# Patient Record
Sex: Male | Born: 1945 | Race: Black or African American | Hispanic: No | Smoking: Never smoker
Health system: Southern US, Community
[De-identification: ages and names within clinical notes are randomized; demographics above are authoritative.]

## PROBLEM LIST (undated history)

## (undated) DIAGNOSIS — E119 Type 2 diabetes mellitus without complications: Secondary | ICD-10-CM

## (undated) DIAGNOSIS — E785 Hyperlipidemia, unspecified: Secondary | ICD-10-CM

## (undated) DIAGNOSIS — I10 Essential (primary) hypertension: Secondary | ICD-10-CM

## (undated) HISTORY — DX: Hyperlipidemia, unspecified: E78.5

## (undated) HISTORY — PX: CATARACT EXTRACTION: SUR2

## (undated) HISTORY — DX: Essential (primary) hypertension: I10

## (undated) HISTORY — PX: REPLACEMENT TOTAL KNEE: SUR1224

---

## 2006-12-24 ENCOUNTER — Ambulatory Visit: Payer: Self-pay | Admitting: Specialist

## 2008-05-19 ENCOUNTER — Other Ambulatory Visit: Payer: Self-pay

## 2008-05-19 ENCOUNTER — Inpatient Hospital Stay: Payer: Self-pay | Admitting: *Deleted

## 2010-06-21 ENCOUNTER — Emergency Department: Payer: Self-pay | Admitting: Emergency Medicine

## 2010-12-12 ENCOUNTER — Emergency Department: Payer: Self-pay | Admitting: Unknown Physician Specialty

## 2012-01-30 ENCOUNTER — Emergency Department: Payer: Self-pay | Admitting: Emergency Medicine

## 2012-01-30 LAB — CBC
HCT: 42.7 % (ref 40.0–52.0)
HGB: 14.1 g/dL (ref 13.0–18.0)
MCHC: 33 g/dL (ref 32.0–36.0)
Platelet: 225 10*3/uL (ref 150–440)
RBC: 5.25 10*6/uL (ref 4.40–5.90)
RDW: 13 % (ref 11.5–14.5)
WBC: 7.9 10*3/uL (ref 3.8–10.6)

## 2012-01-30 LAB — BASIC METABOLIC PANEL
Calcium, Total: 8.8 mg/dL (ref 8.5–10.1)
Chloride: 99 mmol/L (ref 98–107)
Co2: 28 mmol/L (ref 21–32)
EGFR (African American): >60
EGFR (Non-African Amer.): >60
Glucose: 160 mg/dL — ABNORMAL HIGH (ref 65–99)
Osmolality: 286 (ref 275–301)
Sodium: 141 mmol/L (ref 136–145)

## 2012-01-30 LAB — TROPONIN I: Troponin-I: <0.02 ng/mL

## 2012-01-31 LAB — CK TOTAL AND CKMB (NOT AT ARMC)
CK, Total: 399 U/L — ABNORMAL HIGH (ref 35–232)
CK-MB: 3.4 ng/mL (ref 0.5–3.6)

## 2012-01-31 LAB — TROPONIN I: Troponin-I: <0.02 ng/mL

## 2015-03-11 DIAGNOSIS — M1711 Unilateral primary osteoarthritis, right knee: Secondary | ICD-10-CM | POA: Insufficient documentation

## 2016-01-11 DIAGNOSIS — M75121 Complete rotator cuff tear or rupture of right shoulder, not specified as traumatic: Secondary | ICD-10-CM | POA: Insufficient documentation

## 2017-04-04 DIAGNOSIS — M19012 Primary osteoarthritis, left shoulder: Secondary | ICD-10-CM | POA: Insufficient documentation

## 2017-08-22 DIAGNOSIS — M19021 Primary osteoarthritis, right elbow: Secondary | ICD-10-CM | POA: Insufficient documentation

## 2017-11-14 DIAGNOSIS — M25461 Effusion, right knee: Secondary | ICD-10-CM | POA: Diagnosis not present

## 2017-11-14 DIAGNOSIS — M1711 Unilateral primary osteoarthritis, right knee: Secondary | ICD-10-CM | POA: Diagnosis not present

## 2017-12-28 ENCOUNTER — Ambulatory Visit: Payer: Self-pay | Admitting: Family Medicine

## 2017-12-28 ENCOUNTER — Encounter: Payer: Self-pay | Admitting: Family Medicine

## 2017-12-28 VITALS — BP 154/80 | HR 63 | Temp 98.5°F | Resp 16 | Ht 69.0 in | Wt 234.0 lb

## 2017-12-28 DIAGNOSIS — Z7689 Persons encountering health services in other specified circumstances: Secondary | ICD-10-CM

## 2017-12-28 DIAGNOSIS — E785 Hyperlipidemia, unspecified: Secondary | ICD-10-CM | POA: Insufficient documentation

## 2017-12-28 DIAGNOSIS — M8949 Other hypertrophic osteoarthropathy, multiple sites: Secondary | ICD-10-CM

## 2017-12-28 DIAGNOSIS — M150 Primary generalized (osteo)arthritis: Secondary | ICD-10-CM

## 2017-12-28 DIAGNOSIS — R7309 Other abnormal glucose: Secondary | ICD-10-CM

## 2017-12-28 DIAGNOSIS — E1169 Type 2 diabetes mellitus with other specified complication: Secondary | ICD-10-CM | POA: Insufficient documentation

## 2017-12-28 DIAGNOSIS — I10 Essential (primary) hypertension: Secondary | ICD-10-CM

## 2017-12-28 DIAGNOSIS — M159 Polyosteoarthritis, unspecified: Secondary | ICD-10-CM | POA: Insufficient documentation

## 2017-12-28 NOTE — Assessment & Plan Note (Addendum)
Inadequately controlled HTN on current regimen. Repeat BP manual limited improvement Chronic history of elevated BP. He has not adhered to lifestyle/diet recommendations - Home BP readings none available  No known complications     Plan:  1. Continue current BP regimen - Metoprolol XL 50mg  daily, Lisinopril 20mg  daily, HCTZ 12.5mg  cap daily 2. Encourage improved lifestyle - emphasized importance of low sodium diet, regular exercise 3. Start monitor BP outside office, bring readings to next visit, if persistently >140/90 or new symptoms notify office sooner 4. Follow-up 3 months - may need to inc dose HCTZ in future to 25mg  or lisinopril adjust other med

## 2017-12-28 NOTE — Assessment & Plan Note (Signed)
Uncertain results last done 3 months ago from prior PCP Last lipid panel reported 3 months ago  Plan: 1. Continue current meds - Atorvastatin 20mg  daily 2. Future consider ASA 3. Encourage improved lifestyle - low carb/cholesterol, reduce portion size, start regular exercise 4. Follow-up 3 months - future will consider repeat fasting lipids after review last result, likely needs only yearly  Will refill med when ready

## 2017-12-28 NOTE — Assessment & Plan Note (Signed)
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 Currently on Metformin 1000mg  BID - advised to continue for now has refills 2. Encourage improved lifestyle - low carb, low sugar diet, reduce portion size, start regular exercise - Handout given on low glycemic index diet options 3. Follow-up 3 months PreDM A1c  Review prior records

## 2017-12-28 NOTE — Patient Instructions (Addendum)
Thank you for coming to the office today.   1. Continue on same medicines  When you get to last 1-2 weeks of pills - then call OUR OFFICE here and request refill of them, we can give you 90 day supply  Try to improve diet with lower carb and sugars, and low sodium, drink more water like 30 to 60 oz of water in a day  Try to start walking for exercise  Please schedule a Follow-up Appointment to: Return in about 3 months (around 03/27/2018) for DM A1c, HTN, HLD med adjust.    If you have any other questions or concerns, please feel free to call the office or send a message through [place]. You may also schedule an earlier appointment if necessary.  Additionally, you may be receiving a survey about your experience at our office within a few days to 1 week by e-mail or mail. We value your feedback.  Nobie Putnam, DO [place]

## 2017-12-28 NOTE — Progress Notes (Signed)
Subjective:    Patient ID: Brady Sanchez, male    DOB: 1946/02/11, 72 y.o.   MRN: [id number]  Brady Sanchez is a 72 y.o. male presenting on 12/28/2017 for Establish Care (HTN)  Previous patient of Dr Nicky Pugh, prior PCP, who has now retired. He is here to establish care with new PCP.  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 He does not check BP at home, states it has not been easy to control on meds but he is not worried about it. Current Meds - Metoprolol XL 50mg  daily, Lisinopril 20mg  daily, HCTZ 12.5mg  cap daily   Reports good compliance, took meds today. Tolerating well, w/o complaints. - he has over half of 90 day supply left then will be out of refills  HYPERLIPIDEMIA: - Reports no concerns. Last lipid panel 3 months ago, do not have result for review. - Currently taking Atorvastatin 20mg  daily, tolerating well without side effects or myalgias  Elevated A1c Reports this is a more recent concern, he has had higher A1c recently, does not recall reading.  Does not check CBG Meds: Metformin 1000mg  BID (occasinal missed doses in PM) Reports good compliance. Tolerating well w/o side-effects Currently on ACEi Lifestyle: - Diet (Not following particular diet, not doing low carb or low sugar, he is aware needs to eat low sodium but does not do this much. Drinks some soda and occasionally alcohol, not drinking much water)  - Exercise (No regular exercise, active with walking often at work) Denies hypoglycemia, polyuria, visual changes, numbness or tingling.  History of Adjustment  Disorder / Anxiety >5-7 years ago, med dissolve thinks this may have been an anxiolytic med, withdrawal symptoms then finished it and did not want to take again.  Osteoarthritis, multiple joints Followed by [facility] has had recent worsening R Knee pain and received steroid injection before. He is s/p L TKR now with dramatic improvement Still has intermittent episodes of R knee pain, with some episodic swelling. He received a series of 3 R knee joint injections synvisc type, improved after.  He is widowed since 1995.  Health Maintenance: Due for Flu Shot, declines today despite counseling on benefits  He does not recall if ever received Pneumonia vaccines in past, will review record  He does not recall if ever had Colonoscopy.  Depression screen [facility] 2/9 12/28/2017  Decreased Interest 0  Down, Depressed, Hopeless 0  PHQ - 2 Score 0     GAD 7 : Generalized Anxiety Score 12/28/2017  Nervous, Anxious, on Edge 0  Control/stop worrying 0  Worry too much - different things 0  Trouble relaxing 0  Restless 0  Easily annoyed or irritable 0  Afraid - awful might happen 0  Total GAD 7 Score 0  Anxiety Difficulty Not difficult at all    Past Medical History:  Diagnosis Date  . Hyperlipidemia   . Hypertension    Past Surgical History:  Procedure Laterality Date  . REPLACEMENT TOTAL KNEE     Social History   Socioeconomic History  . Marital status: Widowed  Spouse name: Not on file  . Number of children: Not on file  . Years of education: Not on file  . Highest education level: Not on file  Social Needs  . Financial resource strain: Not on file  . Food insecurity - worry: Not on file  . Food insecurity - inability: Not on file  . Transportation needs - medical: Not on file  . Transportation needs - non-medical: Not on file  Occupational History  . Not on file  Tobacco Use  . Smoking status: Never Smoker  . Smokeless tobacco: Never Used  Substance  and Sexual Activity  . Alcohol use: Yes  . Drug use: No  . Sexual activity: Not on file  Other Topics Concern  . Not on file  Social History Narrative  . Not on file   Family History  Problem Relation Age of Onset  . Seizures Mother   . Cancer Father        lung   Current Outpatient Medications on File Prior to Visit  Medication Sig  . atorvastatin (LIPITOR) 20 MG tablet TAKE 1 TABLET BY MOUTH EVERY DAY FOR CHOLESTEROL  . hydrochlorothiazide (MICROZIDE) 12.5 MG capsule TAKE ONE CAPSULE BY MOUTH EVERY DAY FOR HIGH BLOOD PRESSURE  . lisinopril (PRINIVIL,ZESTRIL) 20 MG tablet Take 20 mg by mouth daily.  . metFORMIN (GLUCOPHAGE) 1000 MG tablet Take 1,000 mg by mouth 2 (two) times daily.  . metoprolol succinate (TOPROL-XL) 50 MG 24 hr tablet Take 50 mg by mouth daily. for high blood pressure   No current facility-administered medications on file prior to visit.     Review of Systems  Constitutional: Negative for activity change, appetite change, chills, diaphoresis, fatigue and fever.  HENT: Negative for congestion and hearing loss.   Eyes: Negative for visual disturbance.  Respiratory: Negative for cough, chest tightness, shortness of breath and wheezing.   Cardiovascular: Negative for chest pain, palpitations and leg swelling.  Gastrointestinal: Negative for abdominal pain, anal bleeding, blood in stool, constipation, diarrhea, nausea and vomiting.  Endocrine: Negative for cold intolerance and polyuria.  Genitourinary: Negative for dysuria, frequency and hematuria.  Musculoskeletal: Positive for arthralgias (r knee pain, arthritis). Negative for neck pain.  Skin: Negative for rash.  Allergic/Immunologic: Negative for environmental allergies.  Neurological: Negative for dizziness, weakness, light-headedness, numbness and headaches.  Hematological: Negative for adenopathy.  Psychiatric/Behavioral: Negative for behavioral problems, dysphoric mood and sleep disturbance. The patient  is not nervous/anxious.    Per HPI unless specifically indicated above     Objective:    BP (!) 154/80 (Cuff Size: Normal)   Pulse 63   Temp 98.5 F (36.9 C) (Oral)   Resp 16   Ht 5\' 9"  (1.753 m)   Wt 234 lb (106.1 kg)   BMI 34.56 kg/m   Wt Readings from Last 3 Encounters:  12/28/17 234 lb (106.1 kg)    Physical Exam  Constitutional: He is oriented to person, place, and time. He appears well-developed and well-nourished. No distress.  Well-appearing, comfortable, cooperative  HENT:  Head: Normocephalic and atraumatic.  Mouth/Throat: Oropharynx is clear and moist.  Eyes: Conjunctivae are normal. Right eye exhibits no discharge. Left eye exhibits no discharge.  Neck: Normal range of motion. Neck supple. No thyromegaly present.  Cardiovascular: Normal rate, regular rhythm, normal heart sounds and intact distal pulses.  No murmur heard. Pulmonary/Chest: Effort normal and breath sounds normal. No respiratory distress. He has no wheezes. He has no rales.  Musculoskeletal: Normal range of motion. He  exhibits no edema.  Right Knees Inspection: Some bulky deformity appearance and symmetrical. No ecchymosis. Mild localized soft tissue edema. Palpation: Non-tender. Mild crepitus ROM: Slightly limited full ROm flex/ext Strength: 5/5 intact knee flex/ext, ankle dorsi/plantarflex Neurovascular: distally intact sensation light touch and pulses  Lymphadenopathy:    He has no cervical adenopathy.  Neurological: He is alert and oriented to person, place, and time.  Skin: Skin is warm and dry. No rash noted. He is not diaphoretic. No erythema.  Psychiatric: He has a normal mood and affect. His behavior is normal.  Well groomed, good eye contact, normal speech and thoughts  Nursing note and vitals reviewed.  No results found for this or any previous visit.    Assessment & Plan:   Problem List Items Addressed This Visit    Elevated hemoglobin A1c    Unknown current status A1c, seems to  be Pre-DM based on history Concern with obesity, HTN, HLD  Plan:  1. Currently on Metformin 1000mg  BID - advised to continue for now has refills 2. Encourage improved lifestyle - low carb, low sugar diet, reduce portion size, start regular exercise - Handout given on low glycemic index diet options 3. Follow-up 3 months PreDM A1c  Review prior records      Hyperlipidemia    Uncertain results last done 3 months ago from prior PCP Last lipid panel reported 3 months ago  Plan: 1. Continue current meds - Atorvastatin 20mg  daily 2. Future consider ASA 3. Encourage improved lifestyle - low carb/cholesterol, reduce portion size, start regular exercise 4. Follow-up 3 months - future will consider repeat fasting lipids after review last result, likely needs only yearly  Will refill med when ready      Relevant Medications   hydrochlorothiazide (MICROZIDE) 12.5 MG capsule   lisinopril (PRINIVIL,ZESTRIL) 20 MG tablet   metoprolol succinate (TOPROL-XL) 50 MG 24 hr tablet   atorvastatin (LIPITOR) 20 MG tablet   Hypertension - Primary    Inadequately controlled HTN on current regimen. Repeat BP manual limited improvement Chronic history of elevated BP. He has not adhered to lifestyle/diet recommendations - Home BP readings none available  No known complications     Plan:  1. Continue current BP regimen - Metoprolol XL 50mg  daily, Lisinopril 20mg  daily, HCTZ 12.5mg  cap daily 2. Encourage improved lifestyle - emphasized importance of low sodium diet, regular exercise 3. Start monitor BP outside office, bring readings to next visit, if persistently >140/90 or new symptoms notify office sooner 4. Follow-up 3 months - may need to inc dose HCTZ in future to 25mg  or lisinopril adjust other med      Relevant Medications   hydrochlorothiazide (MICROZIDE) 12.5 MG capsule   lisinopril (PRINIVIL,ZESTRIL) 20 MG tablet   metoprolol succinate (TOPROL-XL) 50 MG 24 hr tablet   atorvastatin (LIPITOR)  20 MG tablet   Osteoarthritis of multiple joints    Review outside records/imaging Prior injections, advised to follow-up when needed Followed by [facility]       Other Visit Diagnoses    Encounter to establish care with new doctor     Requested prior PCP records from Dr Brynda Greathouse, who has retired Will review upon receipt Need to determine prior screening vaccines, colonoscopy, labs etc      No orders of the defined types were placed in this encounter.   Follow up plan: Return in about 3 months (around 03/27/2018) for DM A1c, HTN, HLD med adjust.  Notify office when nearly out of meds 1-2  week in advance will send 90 day refills  Nobie Putnam, [facility] 12/28/2017, 5:41 PM

## 2017-12-28 NOTE — Assessment & Plan Note (Signed)
Review outside records/imaging Prior injections, advised to follow-up when needed Followed by [place]

## 2018-02-08 ENCOUNTER — Encounter: Payer: Self-pay | Admitting: Family Medicine

## 2018-02-08 ENCOUNTER — Ambulatory Visit (INDEPENDENT_AMBULATORY_CARE_PROVIDER_SITE_OTHER): Payer: BLUE CROSS/BLUE SHIELD | Admitting: Family Medicine

## 2018-02-08 VITALS — BP 148/80 | HR 61 | Temp 97.9°F | Resp 16 | Ht 69.0 in | Wt 235.0 lb

## 2018-02-08 DIAGNOSIS — M159 Polyosteoarthritis, unspecified: Secondary | ICD-10-CM

## 2018-02-08 DIAGNOSIS — M150 Primary generalized (osteo)arthritis: Secondary | ICD-10-CM

## 2018-02-08 DIAGNOSIS — I10 Essential (primary) hypertension: Secondary | ICD-10-CM | POA: Diagnosis not present

## 2018-02-08 DIAGNOSIS — M8949 Other hypertrophic osteoarthropathy, multiple sites: Secondary | ICD-10-CM

## 2018-02-08 DIAGNOSIS — M1711 Unilateral primary osteoarthritis, right knee: Secondary | ICD-10-CM

## 2018-02-08 MED ORDER — HYDROCHLOROTHIAZIDE 12.5 MG PO CAPS: 12.5000 mg | ORAL_CAPSULE | Freq: Every day | ORAL | 3 refills | Status: DC

## 2018-02-08 MED ORDER — LISINOPRIL 20 MG PO TABS: 20.0000 mg | ORAL_TABLET | Freq: Every day | ORAL | 3 refills | Status: DC

## 2018-02-08 MED ORDER — METOPROLOL SUCCINATE ER 50 MG PO TB24: 50.0000 mg | ORAL_TABLET | Freq: Every day | ORAL | 3 refills | Status: DC

## 2018-02-08 NOTE — Progress Notes (Signed)
Subjective:    Patient ID: Brady Sanchez, male    DOB: 1946/09/16, 72 y.o.   MRN: [id number]  Brady Sanchez is a 72 y.o. male presenting on 02/08/2018 for Knee Pain (Right side )   HPI   CHRONIC HTN: Reports problem HTN >10 years now. He does not check BP at home, states it has not been easy to control on meds Current Meds - Metoprolol XL 50mg  daily, Lisinopril 20mg  daily, HCTZ 12.5mg  cap daily - request refills today Reports good compliance, took meds today. Tolerating well, w/o complaints Limited exercise due to knee pain. Denies CP, dyspnea, HA, edema, dizziness / lightheadedness  Chronic R Knee Pain / Osteoarthritis, multiple joints Followed by [facility]) last apt 11/2017. He is s/p L TKR from Dr Okey Dupre at same office. He has done well with this, and is now considering R knee replacement. - he has completed R knee injections series of 3 synvisc and also steroid injections, temporary relief only, limited results - Now he complaints of gradual worsening pain and stiffness in back muscles of knee - Worse with prolonged activity with work - Occasional weakness in knee feels may "give way" or unstable at times - Takes occasional Ibuprofen 200mg  x 3 at night with some relief, lay down some improved Denies any fall or injury, worsening redness swelling other joint pain   Health Maintenance: Due for several HM topics - reviewed record from prior PCP and these were not completed. We can review these at future physical  Depression screen [facility] 2/9 02/08/2018 12/28/2017  Decreased Interest 0 0  Down, Depressed, Hopeless 0 0  PHQ - 2 Score 0 0    Social History   Tobacco Use  . Smoking status: Never Smoker  . Smokeless tobacco: Never Used  Substance Use Topics  . Alcohol use: Yes  . Drug use: No    Review of Systems Per HPI unless specifically indicated above     Objective:    BP (!) 148/80 (BP Location: Left Arm, Cuff Size: Normal)   Pulse 61    Temp 97.9 F (36.6 C) (Oral)   Resp 16   Ht 5\' 9"  (1.753 m)   Wt 235 lb (106.6 kg)   BMI 34.70 kg/m   Wt Readings from Last 3 Encounters:  02/08/18 235 lb (106.6 kg)  12/28/17 234 lb (106.1 kg)    Physical Exam  Constitutional: He is oriented to person, place, and time. He appears well-developed and well-nourished. No distress.  Well-appearing, comfortable, cooperative  HENT:  Head: Normocephalic and atraumatic.  Mouth/Throat: Oropharynx is clear and moist.  Eyes: Conjunctivae are normal. Right eye exhibits no discharge. Left eye exhibits no discharge.  Neck: Normal range of motion. Neck supple.  Cardiovascular: Normal rate, regular rhythm, normal heart sounds and intact distal pulses.  No murmur heard. Pulmonary/Chest: Effort normal and breath sounds normal. No respiratory distress. He has no wheezes. He has no rales.  Musculoskeletal: Normal range of motion. He exhibits no edema.  Right Knee - similar to last exam Inspection: Some bulky deformity appearance and symmetrical. No ecchymosis. Mild localized soft tissue edema. Palpation: Mild sore but non tender posterior knee area and muscles. Mild crepitus ROM: Slightly limited full ROM flex/ext - compared to last visit Strength: 5/5 intact knee flex/ext, ankle dorsi/plantarflex Neurovascular: distally intact sensation light touch and pulses  Antalgic gait due to R knee pain  Lymphadenopathy:    He has no cervical adenopathy.  Neurological: He is  alert and oriented to person, place, and time.  Skin: Skin is warm and dry. No rash noted. He is not diaphoretic. No erythema.  Psychiatric: He has a normal mood and affect. His behavior is normal.  Well groomed, good eye contact, normal speech and thoughts  Nursing note and vitals reviewed.  No results found for this or any previous visit.    Assessment & Plan:   Problem List Items Addressed This Visit    Hypertension - Primary    Still seems inadequately controlled HTN on  current regimen. Repeat BP manual somewhat improved. Suspect chronic issue on BP control, also limited lifestyle and chronic knee pain contributing - Home BP readings none available  No known complications - due for labs Cr check    Plan:  1. Continue current BP regimen - refilled Metoprolol XL 50mg  daily, Lisinopril 20mg  daily, HCTZ 12.5mg  cap daily 2. Encourage improved lifestyle - emphasized importance of low sodium diet, regular exercise 3. Start monitor BP outside office, bring readings to next visit, if persistently >140/90 or new symptoms notify office sooner 4. Follow-up 3 months will be due for annual- requested blood work to be scheduled, may need to inc dose HCTZ in future to 25mg  or lisinopril adjust other med      Relevant Medications   hydrochlorothiazide (MICROZIDE) 12.5 MG capsule   lisinopril (PRINIVIL,ZESTRIL) 20 MG tablet   metoprolol succinate (TOPROL-XL) 50 MG 24 hr tablet   Osteoarthritis of multiple joints    Chronic worsening R generalized vs posterior Knee pain without known injury or trauma  Known knee OA/DJD - Able to bear weight some gait difficulty, some instability,concern possible meniscus or degenerative problem - Prior L TKR, no R knee surgery - Inadequate conservative therapy / not responding to injection therapy per Sports Medicine  Plan: 1. Reviewed prognosis and treatment course for knee OA/DJD and knee pain - Ultimately recommend that he call and schedule f/u back with [facility], have consult with ortho for R knee, may consider further imaging or adv imaging and possible arthroscope vs TKR if his quality of life is at the point where he may benefit from surgery now with limitations from knee pain - Recommend Tylenol, topical therapy, RICE therapy - Offered steroid injection for temporary relief he declined today - He may consider topical voltaren gel NSAID PRN - Follow-up as needed - advised I have little more to offer for his knee         Other Visit Diagnoses    Primary osteoarthritis of right knee          Meds ordered this encounter  Medications  . hydrochlorothiazide (MICROZIDE) 12.5 MG capsule    Sig: Take 1 capsule (12.5 mg total) by mouth daily.    Dispense:  90 capsule    Refill:  3  . lisinopril (PRINIVIL,ZESTRIL) 20 MG tablet    Sig: Take 1 tablet (20 mg total) by mouth daily.    Dispense:  90 tablet    Refill:  3  . metoprolol succinate (TOPROL-XL) 50 MG 24 hr tablet    Sig: Take 1 tablet (50 mg total) by mouth daily. for high blood pressure    Dispense:  90 tablet    Refill:  3      Follow up plan: Return in about 3 months (around 05/11/2018).  He may schedule future fasting lab and Annual Physical in 3 months, or can return as planned in May or June for DM A1c follow-up and  HTN med adjust  Nobie Putnam, [facility] 02/08/2018, 12:07 PM

## 2018-02-08 NOTE — Assessment & Plan Note (Signed)
Chronic worsening R generalized vs posterior Knee pain without known injury or trauma  Known knee OA/DJD - Able to bear weight some gait difficulty, some instability,concern possible meniscus or degenerative problem - Prior L TKR, no R knee surgery - Inadequate conservative therapy / not responding to injection therapy per Sports Medicine  Plan: 1. Reviewed prognosis and treatment course for knee OA/DJD and knee pain - Ultimately recommend that he call and schedule f/u back with [facility], have consult with ortho for R knee, may consider further imaging or adv imaging and possible arthroscope vs TKR if his quality of life is at the point where he may benefit from surgery now with limitations from knee pain - Recommend Tylenol, topical therapy, RICE therapy - Offered steroid injection for temporary relief he declined today - He may consider topical voltaren gel NSAID PRN - Follow-up as needed - advised I have little more to offer for his knee

## 2018-02-08 NOTE — Patient Instructions (Addendum)
Thank you for coming to the office today.  For knee I think the arthritis may be severe enough that it may require surgery or at least consultation back with your [facility] doctor  Call them to schedule to see Dr Florian Buff or associate to determine what to do next  [facility]  [place], [place] [address]  [phone]  If you change your mind, we can do an injection here of steroid medicine again  Refilled BP meds  DUE for FASTING BLOOD WORK (no food or drink after midnight before the lab appointment, only water or coffee without cream/sugar on the morning of)  SCHEDULE "Lab Only" visit in the morning at the clinic for lab draw in 3 MONTHS   - Make sure Lab Only appointment is at about 1 week before your next appointment, so that results will be available  For Lab Results, once available within 2-3 days of blood draw, you can can log in to MyChart online to view your results and a brief explanation. Also, we can discuss results at next follow-up visit.   Please schedule a Follow-up Appointment to: Return in about 3 months (around 05/11/2018) for Annual Physical.  If you have any other questions or concerns, please feel free to call the office or send a message through [place]. You may also schedule an earlier appointment if necessary.  Additionally, you may be receiving a survey about your experience at our office within a few days to 1 week by e-mail or mail. We value your feedback.  Nobie Putnam, DO [place]

## 2018-02-08 NOTE — Assessment & Plan Note (Signed)
Still seems inadequately controlled HTN on current regimen. Repeat BP manual somewhat improved. Suspect chronic issue on BP control, also limited lifestyle and chronic knee pain contributing - Home BP readings none available  No known complications - due for labs Cr check    Plan:  1. Continue current BP regimen - refilled Metoprolol XL 50mg  daily, Lisinopril 20mg  daily, HCTZ 12.5mg  cap daily 2. Encourage improved lifestyle - emphasized importance of low sodium diet, regular exercise 3. Start monitor BP outside office, bring readings to next visit, if persistently >140/90 or new symptoms notify office sooner 4. Follow-up 3 months will be due for annual- requested blood work to be scheduled, may need to inc dose HCTZ in future to 25mg  or lisinopril adjust other med

## 2018-02-21 DIAGNOSIS — E1121 Type 2 diabetes mellitus with diabetic nephropathy: Secondary | ICD-10-CM | POA: Diagnosis not present

## 2018-02-21 DIAGNOSIS — M1711 Unilateral primary osteoarthritis, right knee: Secondary | ICD-10-CM | POA: Diagnosis not present

## 2018-03-28 ENCOUNTER — Ambulatory Visit: Payer: Self-pay | Admitting: Family Medicine

## 2018-05-01 DIAGNOSIS — E119 Type 2 diabetes mellitus without complications: Secondary | ICD-10-CM | POA: Diagnosis not present

## 2018-05-06 DIAGNOSIS — E119 Type 2 diabetes mellitus without complications: Secondary | ICD-10-CM | POA: Insufficient documentation

## 2018-05-08 DIAGNOSIS — E119 Type 2 diabetes mellitus without complications: Secondary | ICD-10-CM | POA: Diagnosis not present

## 2018-05-13 DIAGNOSIS — M1711 Unilateral primary osteoarthritis, right knee: Secondary | ICD-10-CM | POA: Diagnosis not present

## 2018-05-13 DIAGNOSIS — M25562 Pain in left knee: Secondary | ICD-10-CM | POA: Diagnosis not present

## 2018-05-13 DIAGNOSIS — Z96652 Presence of left artificial knee joint: Secondary | ICD-10-CM | POA: Diagnosis not present

## 2018-05-13 LAB — BASIC METABOLIC PANEL: Creatinine: 1.1 (ref <=1.3)

## 2018-05-13 LAB — HEMOGLOBIN A1C: HEMOGLOBIN A1C: 7.9

## 2018-05-17 ENCOUNTER — Encounter: Payer: Self-pay | Admitting: Family Medicine

## 2018-05-17 ENCOUNTER — Ambulatory Visit: Payer: BLUE CROSS/BLUE SHIELD | Admitting: Family Medicine

## 2018-05-17 VITALS — BP 138/64 | HR 59 | Temp 98.4°F | Resp 16 | Ht 69.0 in | Wt 223.0 lb

## 2018-05-17 DIAGNOSIS — I10 Essential (primary) hypertension: Secondary | ICD-10-CM

## 2018-05-17 DIAGNOSIS — E1169 Type 2 diabetes mellitus with other specified complication: Secondary | ICD-10-CM

## 2018-05-17 MED ORDER — METOPROLOL SUCCINATE ER 50 MG PO TB24: 50.0000 mg | ORAL_TABLET | Freq: Every day | ORAL | 3 refills | Status: DC

## 2018-05-17 NOTE — Progress Notes (Signed)
Subjective:    Patient ID: Brady Sanchez, male    DOB: [birth year], 72 y.o.   MRN: [id number]  Brady Sanchez is a 72 y.o. male presenting on 05/17/2018 for Hypertension (has question about metoprolol and lisinopril)   HPI   CHRONIC HTN: Recently ran out of Metoprolol XL 50mg  daily, this was prior BP med from prior PCP Dr Brynda Greathouse. He does not check BP at home, states it has not been easy to control on meds in past. Current Meds -Metoprolol XL 50mg  daily (OUT CURRENTLY), Lisinopril 20mg  daily, HCTZ 12.5mg  cap daily Reports good compliance, took meds today. Tolerating well, w/o complaints Limited exercise due to knee pain. Denies CP, dyspnea, HA, edema, dizziness / lightheadedness  Additional information - Scheduled for Right Knee Total Arthroplasty surgery by Dr Florian Buff [facility] on 05/31/18 - He was referred to [facility] for pre-op improving A1c, last A1c checked on 05/13/18 was 7.9, he ran out of Metformin and did not notify our office, he ran out of existing rx from prior PCP Dr Brynda Greathouse. Now his rx was filled by [facility].   Depression screen [facility] 2/9 05/17/2018 02/08/2018 12/28/2017  Decreased Interest 0 0 0  Down, Depressed, Hopeless 0 0 0  PHQ - 2 Score 0 0 0    Social History   Tobacco Use  . Smoking status: Never Smoker  . Smokeless tobacco: Never Used  Substance Use Topics  . Alcohol use: Yes  . Drug use: No    Review of Systems Per HPI unless specifically indicated above     Objective:    BP 138/64 (BP Location: Left Arm, Cuff Size: Normal)   Pulse (!) 59   Temp 98.4 F (36.9 C) (Oral)   Resp 16   Ht 5\' 9"  (1.753 m)   Wt 223 lb (101.2 kg)   BMI 32.93 kg/m   Wt Readings from Last 3 Encounters:  05/17/18 223 lb (101.2 kg)  02/08/18 235 lb (106.6 kg)  12/28/17 234 lb (106.1 kg)    Physical Exam  Constitutional: He is oriented to person, place, and time. He appears well-developed and well-nourished. No distress.    Well-appearing, comfortable, cooperative  HENT:  Head: Normocephalic and atraumatic.  Mouth/Throat: Oropharynx is clear and moist.  Eyes: Conjunctivae are normal. Right eye exhibits no discharge. Left eye exhibits no discharge.  Cardiovascular: Normal rate.  Pulmonary/Chest: Effort normal.  Musculoskeletal: He exhibits no edema.  Neurological: He is alert and oriented to person, place, and time.  Skin: Skin is warm and dry. No rash noted. He is not diaphoretic. No erythema.  Psychiatric: He has a normal mood and affect. His behavior is normal.  Well groomed, good eye contact, normal speech and thoughts  Nursing note and vitals reviewed.  Results for orders placed or performed in visit on 54/65/68  Basic metabolic panel  Result Value Ref Range   Creatinine 1.1 0.6 - 1.3  Hemoglobin A1c  Result Value Ref Range   Hemoglobin A1C 7.9       Assessment & Plan:   Problem List Items Addressed This Visit    Hypertension - Primary    Still seems inadequately controlled HTN on current regimen. Repeat BP manual somewhat improved. Suspect chronic issue on BP control, also limited lifestyle and chronic knee pain contributing - Home BP readings none available  No known complications   Plan:  - Off metoprolol - restart on refill, he ran out of rx from prior PCP - pharmacy  did not fill the last one that I ordered 01/2018 1. Continue current BP regimen - refilled Metoprolol XL 50mg  daily, Lisinopril 20mg  daily, HCTZ 12.5mg  cap daily 2. Encourage improved lifestyle - emphasized importance of low sodium diet, regular exercise 3. Start monitor BP outside office, bring readings to next visit, if persistently >140/90 or new symptoms notify office sooner 4. Follow-up 3 months - may need to inc dose HCTZ in future to 25mg  or lisinopril adjust other med      Relevant Medications   metoprolol succinate (TOPROL-XL) 50 MG 24 hr tablet   Type 2 diabetes mellitus with other specified complication ([place])     Recently confirmed A1c 7.9 on 05/13/18 from [facility] End[facility] - note he did not follow-up within 3 months back in 03/2018 as advised for [place] to check his A1c Referred to Endo by Ortho for optimizing A1c before surgery He was off Metformin for while due to no refills from prior PCP, did not notify our office He has been on Metformin now 1000mg  BID for few days Follow-up 3 months for DM A1c and routine screening visit         Meds ordered this encounter  Medications  . metoprolol succinate (TOPROL-XL) 50 MG 24 hr tablet    Sig: Take 1 tablet (50 mg total) by mouth daily. for high blood pressure    Dispense:  90 tablet    Refill:  3      Follow up plan: Return in about 3 months (around 08/17/2018) for HTN, DM A1c.  Nobie Putnam, [facility] 05/17/2018, 10:06 AM

## 2018-05-17 NOTE — Assessment & Plan Note (Signed)
Still seems inadequately controlled HTN on current regimen. Repeat BP manual somewhat improved. Suspect chronic issue on BP control, also limited lifestyle and chronic knee pain contributing - Home BP readings none available  No known complications   Plan:  - Off metoprolol - restart on refill, he ran out of rx from prior PCP - pharmacy did not fill the last one that I ordered 01/2018 1. Continue current BP regimen - refilled Metoprolol XL 50mg  daily, Lisinopril 20mg  daily, HCTZ 12.5mg  cap daily 2. Encourage improved lifestyle - emphasized importance of low sodium diet, regular exercise 3. Start monitor BP outside office, bring readings to next visit, if persistently >140/90 or new symptoms notify office sooner 4. Follow-up 3 months - may need to inc dose HCTZ in future to 25mg  or lisinopril adjust other med

## 2018-05-17 NOTE — Assessment & Plan Note (Signed)
Recently confirmed A1c 7.9 on 05/13/18 from [facility]ology - note he did not follow-up within 3 months back in 03/2018 as advised for [place] to check his A1c Referred to Endo by Ortho for optimizing A1c before surgery He was off Metformin for while due to no refills from prior PCP, did not notify our office He has been on Metformin now 1000mg  BID for few days Follow-up 3 months for DM A1c and routine screening visit

## 2018-05-17 NOTE — Patient Instructions (Addendum)
Thank you for coming to the office today.  Refilled Metoprolol Succinate XL 50mg  - take one daily  Continue to follow-up with [facility] and Ortho for Knee surgery  Please schedule a Follow-up Appointment to: Return in about 3 months (around 08/17/2018) for HTN, DM A1c.  If you have any other questions or concerns, please feel free to call the office or send a message through [place]. You may also schedule an earlier appointment if necessary.  Additionally, you may be receiving a survey about your experience at our office within a few days to 1 week by e-mail or mail. We value your feedback.  Nobie Putnam, DO [place]

## 2018-05-22 DIAGNOSIS — E119 Type 2 diabetes mellitus without complications: Secondary | ICD-10-CM | POA: Diagnosis not present

## 2018-05-24 ENCOUNTER — Other Ambulatory Visit: Payer: Self-pay | Admitting: Family Medicine

## 2018-05-31 DIAGNOSIS — G8918 Other acute postprocedural pain: Secondary | ICD-10-CM | POA: Diagnosis not present

## 2018-05-31 DIAGNOSIS — E669 Obesity, unspecified: Secondary | ICD-10-CM | POA: Diagnosis not present

## 2018-05-31 DIAGNOSIS — M21061 Valgus deformity, not elsewhere classified, right knee: Secondary | ICD-10-CM | POA: Diagnosis not present

## 2018-05-31 DIAGNOSIS — M1711 Unilateral primary osteoarthritis, right knee: Secondary | ICD-10-CM | POA: Diagnosis not present

## 2018-05-31 DIAGNOSIS — F40240 Claustrophobia: Secondary | ICD-10-CM | POA: Diagnosis not present

## 2018-05-31 DIAGNOSIS — Z7984 Long term (current) use of oral hypoglycemic drugs: Secondary | ICD-10-CM | POA: Diagnosis not present

## 2018-05-31 DIAGNOSIS — I10 Essential (primary) hypertension: Secondary | ICD-10-CM | POA: Diagnosis not present

## 2018-05-31 DIAGNOSIS — Z6833 Body mass index (BMI) 33.0-33.9, adult: Secondary | ICD-10-CM | POA: Diagnosis not present

## 2018-05-31 DIAGNOSIS — Z96652 Presence of left artificial knee joint: Secondary | ICD-10-CM | POA: Diagnosis not present

## 2018-05-31 DIAGNOSIS — E119 Type 2 diabetes mellitus without complications: Secondary | ICD-10-CM | POA: Diagnosis not present

## 2018-05-31 DIAGNOSIS — E118 Type 2 diabetes mellitus with unspecified complications: Secondary | ICD-10-CM | POA: Diagnosis not present

## 2018-06-02 DIAGNOSIS — M1711 Unilateral primary osteoarthritis, right knee: Secondary | ICD-10-CM | POA: Diagnosis not present

## 2018-06-02 DIAGNOSIS — R2689 Other abnormalities of gait and mobility: Secondary | ICD-10-CM | POA: Diagnosis not present

## 2018-06-07 DIAGNOSIS — Z5181 Encounter for therapeutic drug level monitoring: Secondary | ICD-10-CM | POA: Diagnosis not present

## 2018-06-07 DIAGNOSIS — Z87891 Personal history of nicotine dependence: Secondary | ICD-10-CM | POA: Diagnosis not present

## 2018-06-07 DIAGNOSIS — M25561 Pain in right knee: Secondary | ICD-10-CM | POA: Diagnosis not present

## 2018-06-07 DIAGNOSIS — Z7984 Long term (current) use of oral hypoglycemic drugs: Secondary | ICD-10-CM | POA: Diagnosis not present

## 2018-06-07 DIAGNOSIS — X58XXXA Exposure to other specified factors, initial encounter: Secondary | ICD-10-CM | POA: Diagnosis not present

## 2018-06-07 DIAGNOSIS — M25461 Effusion, right knee: Secondary | ICD-10-CM | POA: Diagnosis not present

## 2018-06-07 DIAGNOSIS — L539 Erythematous condition, unspecified: Secondary | ICD-10-CM | POA: Diagnosis not present

## 2018-06-07 DIAGNOSIS — E119 Type 2 diabetes mellitus without complications: Secondary | ICD-10-CM | POA: Diagnosis not present

## 2018-06-07 DIAGNOSIS — Z9889 Other specified postprocedural states: Secondary | ICD-10-CM | POA: Diagnosis not present

## 2018-06-07 DIAGNOSIS — Z79899 Other long term (current) drug therapy: Secondary | ICD-10-CM | POA: Diagnosis not present

## 2018-06-07 DIAGNOSIS — R21 Rash and other nonspecific skin eruption: Secondary | ICD-10-CM | POA: Diagnosis not present

## 2018-06-07 DIAGNOSIS — S80221A Blister (nonthermal), right knee, initial encounter: Secondary | ICD-10-CM | POA: Diagnosis not present

## 2018-06-07 DIAGNOSIS — M7989 Other specified soft tissue disorders: Secondary | ICD-10-CM | POA: Diagnosis not present

## 2018-06-07 DIAGNOSIS — Z96651 Presence of right artificial knee joint: Secondary | ICD-10-CM | POA: Diagnosis not present

## 2018-06-07 DIAGNOSIS — Z7982 Long term (current) use of aspirin: Secondary | ICD-10-CM | POA: Diagnosis not present

## 2018-06-17 ENCOUNTER — Ambulatory Visit: Payer: BLUE CROSS/BLUE SHIELD | Admitting: Family Medicine

## 2018-06-18 DIAGNOSIS — M25461 Effusion, right knee: Secondary | ICD-10-CM | POA: Diagnosis not present

## 2018-06-18 DIAGNOSIS — M1711 Unilateral primary osteoarthritis, right knee: Secondary | ICD-10-CM | POA: Diagnosis not present

## 2018-06-18 DIAGNOSIS — Z96651 Presence of right artificial knee joint: Secondary | ICD-10-CM | POA: Insufficient documentation

## 2018-06-18 DIAGNOSIS — Z96653 Presence of artificial knee joint, bilateral: Secondary | ICD-10-CM | POA: Diagnosis not present

## 2018-06-19 DIAGNOSIS — N5201 Erectile dysfunction due to arterial insufficiency: Secondary | ICD-10-CM | POA: Diagnosis not present

## 2018-06-19 DIAGNOSIS — R351 Nocturia: Secondary | ICD-10-CM | POA: Diagnosis not present

## 2018-06-19 DIAGNOSIS — R3914 Feeling of incomplete bladder emptying: Secondary | ICD-10-CM | POA: Diagnosis not present

## 2018-06-19 DIAGNOSIS — N401 Enlarged prostate with lower urinary tract symptoms: Secondary | ICD-10-CM | POA: Diagnosis not present

## 2018-07-02 DIAGNOSIS — R3914 Feeling of incomplete bladder emptying: Secondary | ICD-10-CM | POA: Diagnosis not present

## 2018-07-02 DIAGNOSIS — N401 Enlarged prostate with lower urinary tract symptoms: Secondary | ICD-10-CM | POA: Diagnosis not present

## 2018-07-05 ENCOUNTER — Encounter: Payer: Self-pay | Admitting: Emergency Medicine

## 2018-07-05 ENCOUNTER — Emergency Department: Payer: BLUE CROSS/BLUE SHIELD

## 2018-07-05 ENCOUNTER — Encounter: Payer: Self-pay | Admitting: Family Medicine

## 2018-07-05 ENCOUNTER — Ambulatory Visit: Payer: BLUE CROSS/BLUE SHIELD | Admitting: Family Medicine

## 2018-07-05 ENCOUNTER — Emergency Department
Admission: EM | Admit: 2018-07-05 | Discharge: 2018-07-05 | Disposition: A | Discharge status: Home / Self Care | Payer: BLUE CROSS/BLUE SHIELD | Attending: Emergency Medicine | Admitting: Emergency Medicine

## 2018-07-05 ENCOUNTER — Other Ambulatory Visit: Payer: Self-pay

## 2018-07-05 VITALS — BP 160/80 | HR 61 | Temp 98.4°F | Resp 16 | Ht 69.0 in | Wt 225.8 lb

## 2018-07-05 DIAGNOSIS — I10 Essential (primary) hypertension: Secondary | ICD-10-CM | POA: Diagnosis not present

## 2018-07-05 DIAGNOSIS — Z79899 Other long term (current) drug therapy: Secondary | ICD-10-CM | POA: Insufficient documentation

## 2018-07-05 DIAGNOSIS — E119 Type 2 diabetes mellitus without complications: Secondary | ICD-10-CM | POA: Diagnosis not present

## 2018-07-05 DIAGNOSIS — Z7984 Long term (current) use of oral hypoglycemic drugs: Secondary | ICD-10-CM | POA: Insufficient documentation

## 2018-07-05 DIAGNOSIS — R42 Dizziness and giddiness: Secondary | ICD-10-CM | POA: Diagnosis not present

## 2018-07-05 DIAGNOSIS — R0602 Shortness of breath: Secondary | ICD-10-CM | POA: Diagnosis not present

## 2018-07-05 DIAGNOSIS — Z96659 Presence of unspecified artificial knee joint: Secondary | ICD-10-CM | POA: Insufficient documentation

## 2018-07-05 LAB — CBC WITH DIFFERENTIAL/PLATELET
BASOS ABS: 0 10*3/uL (ref 0–0.1)
Basophils Relative: 1 %
EOS ABS: 0.3 10*3/uL (ref 0–0.7)
EOS PCT: 5 %
HCT: 41.1 % (ref 40.0–52.0)
Hemoglobin: 13.4 g/dL (ref 13.0–18.0)
LYMPHS PCT: 23 %
Lymphs Abs: 1.5 10*3/uL (ref 1.0–3.6)
MCH: 26.7 pg (ref 26.0–34.0)
MCHC: 32.7 g/dL (ref 32.0–36.0)
MCV: 81.7 fL (ref 80.0–100.0)
MONO ABS: 0.8 10*3/uL (ref 0.2–1.0)
Monocytes Relative: 13 %
Neutro Abs: 3.9 10*3/uL (ref 1.4–6.5)
Neutrophils Relative %: 58 %
PLATELETS: 249 10*3/uL (ref 150–440)
RBC: 5.04 MIL/uL (ref 4.40–5.90)
RDW: 14.9 % — AB (ref 11.5–14.5)
WBC: 6.7 10*3/uL (ref 3.8–10.6)

## 2018-07-05 LAB — COMPREHENSIVE METABOLIC PANEL
ALT: 19 U/L (ref 0–44)
AST: 22 U/L (ref 15–41)
Albumin: 4.3 g/dL (ref 3.5–5.0)
Alkaline Phosphatase: 51 U/L (ref 38–126)
Anion gap: 9 (ref 5–15)
BUN: 11 mg/dL (ref 8–23)
CHLORIDE: 98 mmol/L (ref 98–111)
CO2: 28 mmol/L (ref 22–32)
Calcium: 9.1 mg/dL (ref 8.9–10.3)
Creatinine, Ser: 0.92 mg/dL (ref 0.61–1.24)
GFR calc Af Amer: >60 mL/min (ref >60)
Glucose, Bld: 140 mg/dL — ABNORMAL HIGH (ref 70–99)
POTASSIUM: 3.6 mmol/L (ref 3.5–5.1)
SODIUM: 135 mmol/L (ref 135–145)
Total Bilirubin: 0.6 mg/dL (ref 0.3–1.2)
Total Protein: 7.8 g/dL (ref 6.5–8.1)

## 2018-07-05 LAB — URINALYSIS, COMPLETE (UACMP) WITH MICROSCOPIC
BILIRUBIN URINE: NEGATIVE
Bacteria, UA: NONE SEEN
GLUCOSE, UA: NEGATIVE mg/dL
Hgb urine dipstick: NEGATIVE
KETONES UR: NEGATIVE mg/dL
LEUKOCYTES UA: NEGATIVE
Nitrite: NEGATIVE
PH: 6 (ref 5.0–8.0)
Protein, ur: 100 mg/dL — AB
Specific Gravity, Urine: 1.004 — ABNORMAL LOW (ref 1.005–1.030)
Squamous Epithelial / LPF: NONE SEEN (ref 0–5)

## 2018-07-05 LAB — TROPONIN I

## 2018-07-05 MED ORDER — SODIUM CHLORIDE 0.9 % IV SOLN
1000.0000 mL | Freq: Once | INTRAVENOUS | Status: AC
  Administered 2018-07-05: 1000 mL via INTRAVENOUS

## 2018-07-05 MED ORDER — SODIUM CHLORIDE 0.9 % IV BOLUS: 1000.0000 mL | Freq: Once | INTRAVENOUS | Status: DC

## 2018-07-05 NOTE — ED Triage Notes (Signed)
Patient ambulatory to triage with steady gait, without difficulty or distress noted; pt reports having dizziness this morning with [facility]; denies hx of same, denies pain, denies any recent illness

## 2018-07-05 NOTE — Patient Instructions (Addendum)
Thank you for coming to the office today.  Stop Tamsulosin. Discuss further with Dr Yves Dill on Monday, as it can cause dizziness when you change position or stand up.  Continue other BP medications  BP is still elevated  Stay well hydrated.  Please schedule a Follow-up Appointment to: Return in about 3 months (around 10/05/2018) for DM A1c.  If you have any other questions or concerns, please feel free to call the office or send a message through [place]. You may also schedule an earlier appointment if necessary.  Additionally, you may be receiving a survey about your experience at our office within a few days to 1 week by e-mail or mail. We value your feedback.  Nobie Putnam, DO [place]

## 2018-07-05 NOTE — ED Provider Notes (Signed)
[facility] Emergency Department Provider Note   ____________________________________________    I have reviewed the triage vital signs and the nursing notes.   HISTORY  Chief Complaint Dizziness     HPI Brady Sanchez is a 72 y.o. male who presents with complaints of dizziness this morning.  Patient reports when he woke up this morning he felt dizzy and by that he means somewhat lightheaded.  He noticed it mostly when he would stand up or bend over and then straighten up.  He feels that his chest feels congested.  Denies fevers or chills.  No cough.  No pleurisy.  Had knee surgery 1 month ago, right lower leg continues to be somewhat swollen but he denies significant pain in that area.  Past Medical History:  Diagnosis Date  . Hyperlipidemia   . Hypertension     Patient Active Problem List   Diagnosis Date Noted  . Hypertension 12/28/2017  . Hyperlipidemia 12/28/2017  . Type 2 diabetes mellitus with other specified complication ([place]) 81/11/7508  . 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 REPLACEMENT TOTAL KNEE      Prior to Admission medications   Medication Sig Start Date End Date Taking? Authorizing Provider  atorvastatin (LIPITOR) 20 MG tablet TAKE 1 TABLET BY MOUTH EVERY DAY FOR CHOLESTEROL 11/13/17   [provider]  hydrochlorothiazide (MICROZIDE) 12.5 MG capsule Take 1 capsule (12.5 mg total) by mouth daily. 02/08/18   Karamalegos, Devonne Doughty, DO  lisinopril (PRINIVIL,ZESTRIL) 20 MG tablet Take 1 tablet (20 mg total) by mouth daily. 02/08/18   Karamalegos, Devonne Doughty, DO  metFORMIN (GLUCOPHAGE) 1000 MG tablet Take 1,000 mg by mouth 2 (two) times daily. 12/12/17   [provider]  metoprolol succinate (TOPROL-XL) 50 MG 24 hr tablet Take 1 tablet (50 mg total) by mouth daily. for high blood pressure 05/17/18   Karamalegos, Devonne Doughty, DO  triamcinolone cream  (KENALOG) 0.5 % APPLY TO AFFECTED AREA EVERY DAY 05/24/18   Olin Hauser, DO     Allergies Dm-doxylamine-acetaminophen  Family History  Problem Relation Age of Onset  . Seizures Mother   . Cancer Father        lung    Social History Social History   Tobacco Use  . Smoking status: Never Smoker  . Smokeless tobacco: Never Used  Substance Use Topics  . Alcohol use: Yes  . Drug use: No    Review of Systems  Constitutional: No fever/chills Eyes: No visual changes.  ENT: No sore throat. Cardiovascular: Denies chest pain. Respiratory: As above Gastrointestinal: No abdominal pain.  No nausea, no vomiting.   Genitourinary: Negative for dysuria. Musculoskeletal: As above Skin: Negative for rash. Neurological: Negative for headaches    ____________________________________________   PHYSICAL EXAM:  VITAL SIGNS: ED Triage Vitals  Enc Vitals Group     BP 07/05/18 0643 (!) 209/81     Pulse Rate 07/05/18 0643 62     Resp 07/05/18 0643 18     Temp 07/05/18 0643 98.3 F (36.8 C)     Temp Source 07/05/18 0643 Oral     SpO2 07/05/18 0643 100 %     Weight 07/05/18 0642 108.9 kg (240 lb)     Height 07/05/18 0642 1.727 m (5\' 8" )     Head Circumference --      Peak Flow --      Pain Score 07/05/18 0641 0  Pain Loc --      Pain Edu? --      Excl. in [place]? --     Constitutional: Alert and oriented.  Pleasant and interactive Eyes: Conjunctivae are normal.   Nose: No congestion/rhinnorhea. Mouth/Throat: Mucous membranes are moist.    Cardiovascular: Normal rate, regular rhythm. Grossly normal heart sounds.  Good peripheral circulation. Respiratory: Normal respiratory effort.  No retractions. Gastrointestinal: Soft and nontender. No distention.    Musculoskeletal: Right lower leg: Swelling to the right lower leg distal to the knee, patient states his been like this since surgery 1 month ago Neurologic:  Normal speech and language. No gross focal neurologic  deficits are appreciated.  Skin:  Skin is warm, dry and intact.  Psychiatric: Mood and affect are normal. Speech and behavior are normal.  ____________________________________________   LABS (all labs ordered are listed, but only abnormal results are displayed)  Labs Reviewed  CBC WITH DIFFERENTIAL/PLATELET - Abnormal; Notable for the following components:      Result Value   RDW 14.9 (*)    All other components within normal limits  COMPREHENSIVE METABOLIC PANEL - Abnormal; Notable for the following components:   Glucose, Bld 140 (*)    All other components within normal limits  URINALYSIS, COMPLETE (UACMP) WITH MICROSCOPIC - Abnormal; Notable for the following components:   Color, Urine STRAW (*)    APPearance CLEAR (*)    Specific Gravity, Urine 1.004 (*)    Protein, ur 100 (*)    All other components within normal limits  TROPONIN I   ____________________________________________  EKG  ED ECG REPORT I, Lavonia Drafts, the attending physician, personally viewed and interpreted this ECG.  Date: 07/05/2018  Rhythm: normal sinus rhythm QRS Axis: normal Intervals: normal ST/T Wave abnormalities: normal Narrative Interpretation: no evidence of acute ischemia  ____________________________________________  RADIOLOGY  Chest x-ray shows findings consistent with possible mild pneumonitis which appears mostly chronic ____________________________________________   PROCEDURES  Procedure(s) performed: No  Procedures   Critical Care performed: No ____________________________________________   INITIAL IMPRESSION / ASSESSMENT AND PLAN / ED COURSE  Pertinent labs & imaging results that were available during my care of the patient were reviewed by me and considered in my medical decision making (see chart for details).  Patient well-appearing in no acute distress.  Vital signs notable for significant hypertension.  Review of records demonstrates that the patient has been  working with his PCP for uncontrolled high blood pressure for sometime.  No fevers or chills.  No pleurisy.  No reports of palpitations or history of arrhythmia.  We will give IV fluid, check orthostatics and reevaluate  ----------------------------------------- 9:55 AM on 07/05/2018 -----------------------------------------  Lab work overall unremarkable.  Chest x-ray unchanged interstitial prominence.  Orthostatics checked prior to and after IV fluids were unremarkable.  Patient feels much better after IV fluids.  No indication for hospitalization at this time, return precautions discussed at length.    ____________________________________________   FINAL CLINICAL IMPRESSION(S) / ED DIAGNOSES  Final diagnoses:  Dizziness        Note:  This document was prepared using Dragon voice recognition software and may include unintentional dictation errors.    Lavonia Drafts, MD 07/05/18 [phone]

## 2018-07-05 NOTE — Progress Notes (Signed)
Subjective:    Patient ID: Brady Sanchez, male    DOB: May 15, 1946, 71 y.o.   MRN: [id number]  Brady Sanchez is a 72 y.o. male presenting on 07/05/2018 for Hospitalization Follow-up (dizziness)  Note patient was seen earlier today 07/05/18 in ED and discharged today. He was asked to follow-up at PCP, however he scheduled to be seen today as well on same day. He was notified by our office that insurance would likely not cover today visit due to already seen in ED. He understands this and agrees to cover charge.  HPI   ED FOLLOW-UP VISIT  Hospital/Location: [facility] Date of ED Visit: 07/05/18  Reason for Presenting to ED: Dizziness  FOLLOW-UP - ED provider note and record have been reviewed - Patient presents today same day after recent ED visit. Brief summary of recent course, patient had symptoms of acute onset dizziness and lightheadedness, worse after waking up today, onset after standing up or bending forward and straightening up, no other associated symptoms, he also had elevated BP. He presented to ED and testing in ED with EKG, labs CBC CMET, Troponin and UA, CXR, treated with IVF rehydration and he was discharged.  - Today reports overall has done well after discharge from ED. Symptoms of dizziness lightheadedness have resolved. - Further discussion learned that he was recently given new medicine by [facility] Dr Brady Sanchez 1 week ago, he called family to check name of it, it was for BPH. Tamsulosin 0.4mg  daily. He was unaware of side effect.  He has a follow-up with Urology Dr Brady Sanchez this coming Monday.  - New medications on discharge: None - Changes to current meds on discharge: None  Denies chest pain, dyspnea, headache, vision changes, numbness tingling weakness   Depression screen [facility] 2/9 07/05/2018 05/17/2018 02/08/2018  Decreased Interest 0 0 0  Down, Depressed, Hopeless 0 0 0  PHQ - 2 Score 0 0 0    Social History   Tobacco Use  . Smoking status: Never Smoker    . Smokeless tobacco: Never Used  Substance Use Topics  . Alcohol use: Yes  . Drug use: No    Review of Systems Per HPI unless specifically indicated above     Objective:    BP (!) 160/80 (BP Location: Left Arm, Cuff Size: Normal)   Pulse 61   Temp 98.4 F (36.9 C) (Oral)   Resp 16   Ht 5\' 9"  (1.753 m)   Wt 225 lb 12.8 oz (102.4 kg)   SpO2 99%   BMI 33.34 kg/m   Wt Readings from Last 3 Encounters:  07/05/18 225 lb 12.8 oz (102.4 kg)  07/05/18 240 lb (108.9 kg)  05/17/18 223 lb (101.2 kg)    Physical Exam  Constitutional: He is oriented to person, place, and time. He appears well-developed and well-nourished. No distress.  Well-appearing, comfortable, cooperative  HENT:  Head: Normocephalic and atraumatic.  Mouth/Throat: Oropharynx is clear and moist.  Cardiovascular: Normal rate, regular rhythm, normal heart sounds and intact distal pulses.  No murmur heard. Pulmonary/Chest: Effort normal.  Musculoskeletal: He exhibits no edema.  Neurological: He is alert and oriented to person, place, and time.  Skin: Skin is warm and dry. No rash noted. He is not diaphoretic. No erythema.  Psychiatric: He has a normal mood and affect. His behavior is normal.  Well groomed, good eye contact, normal speech and thoughts  Nursing note and vitals reviewed.  Results for orders placed or performed during the hospital encounter  of 07/05/18  CBC with Differential  Result Value Ref Range   WBC 6.7 3.8 - 10.6 K/uL   RBC 5.04 4.40 - 5.90 MIL/uL   Hemoglobin 13.4 13.0 - 18.0 g/dL   HCT 41.1 40.0 - 52.0 %   MCV 81.7 80.0 - 100.0 fL   MCH 26.7 26.0 - 34.0 pg   MCHC 32.7 32.0 - 36.0 g/dL   RDW 14.9 (H) 11.5 - 14.5 %   Platelets 249 150 - 440 K/uL   Neutrophils Relative % 58 %   Neutro Abs 3.9 1.4 - 6.5 K/uL   Lymphocytes Relative 23 %   Lymphs Abs 1.5 1.0 - 3.6 K/uL   Monocytes Relative 13 %   Monocytes Absolute 0.8 0.2 - 1.0 K/uL   Eosinophils Relative 5 %   Eosinophils Absolute 0.3  0 - 0.7 K/uL   Basophils Relative 1 %   Basophils Absolute 0.0 0 - 0.1 K/uL  Comprehensive metabolic panel  Result Value Ref Range   Sodium 135 135 - 145 mmol/L   Potassium 3.6 3.5 - 5.1 mmol/L   Chloride 98 98 - 111 mmol/L   CO2 28 22 - 32 mmol/L   Glucose, Bld 140 (H) 70 - 99 mg/dL   BUN 11 8 - 23 mg/dL   Creatinine, Ser 0.92 0.61 - 1.24 mg/dL   Calcium 9.1 8.9 - 10.3 mg/dL   Total Protein 7.8 6.5 - 8.1 g/dL   Albumin 4.3 3.5 - 5.0 g/dL   AST 22 15 - 41 U/L   ALT 19 0 - 44 U/L   Alkaline Phosphatase 51 38 - 126 U/L   Total Bilirubin 0.6 0.3 - 1.2 mg/dL   GFR calc non Af Amer >60 >60 mL/min   GFR calc Af Amer >60 >60 mL/min   Anion gap 9 5 - 15  Troponin I  Result Value Ref Range   Troponin I <0.03 <0.03 ng/mL  Urinalysis, Complete w Microscopic  Result Value Ref Range   Color, Urine STRAW (A) YELLOW   APPearance CLEAR (A) CLEAR   Specific Gravity, Urine 1.004 (L) 1.005 - 1.030   pH 6.0 5.0 - 8.0   Glucose, UA NEGATIVE NEGATIVE mg/dL   Hgb urine dipstick NEGATIVE NEGATIVE   Bilirubin Urine NEGATIVE NEGATIVE   Ketones, ur NEGATIVE NEGATIVE mg/dL   Protein, ur 100 (A) NEGATIVE mg/dL   Nitrite NEGATIVE NEGATIVE   Leukocytes, UA NEGATIVE NEGATIVE   RBC / HPF 0-5 0 - 5 RBC/hpf   WBC, UA 0-5 0 - 5 WBC/hpf   Bacteria, UA NONE SEEN NONE SEEN   Squamous Epithelial / LPF NONE SEEN 0 - 5   Mucus PRESENT       Assessment & Plan:   Problem List Items Addressed This Visit    Hypertension - Primary    Other Visit Diagnoses    Dizziness          Clinically with side effect postural dizziness and possibly orthostatic hypotension from Tamsulosin started by his Urologist recently. - Now well hydrated after IVF from ED - Hemodynamically stable except persistent elevated BP  Plan Advised him to hold Tamsulosin starting tomorrow Continue other medications, anti-HTN meds Improve hydration Follow-up with Urology Dr Brady Sanchez on Monday - discuss alternative meds Return  criteria  No orders of the defined types were placed in this encounter.   Follow up plan: Return in about 3 months (around 10/05/2018) for DM A1c.   Brady Sanchez, [facility] 07/05/2018, 11:11 PM

## 2018-07-08 DIAGNOSIS — N401 Enlarged prostate with lower urinary tract symptoms: Secondary | ICD-10-CM | POA: Diagnosis not present

## 2018-07-09 ENCOUNTER — Telehealth: Payer: Self-pay | Admitting: Family Medicine

## 2018-07-09 NOTE — Telephone Encounter (Signed)
Reports he saw Urology Dr Yves Dill on Monday 8/26 and he agreed that the flomax 0.4mg  was causing his side effects of postural dizziness etc. He was taken off this med, has one more follow-up this week with Urology. No new medicine given.  Now patient had episode of dizziness temporarily 30 min or less this morning, he is taking other meds, no provoking factors reported. He says it felt different, now it is resolved. He does not check BP at home.  I advised him I do not have enough information to make a decision today on the BP medication. I would recommend tha the check his BP at home or at pharmacy and record readings. He should be cautious standing up suddenly. Stay well hydrated as well.  We may need to hold or adjust HCTZ dose in future if needed.  He may return sooner than anticipated 09/2018 (for diabetes) to discuss BP medications and dizzy if he keeps having problem  Given strict return precautions again if severe symptoms when to come back sooner.  May need referral to Cardiology in future for help with med management on Hypertension.  Nobie Putnam, [facility] 07/09/2018, 12:14 PM

## 2018-07-09 NOTE — Telephone Encounter (Signed)
Pt.wanted to know if his BP mediation needed to changed he was light headed today. Pt call back # is  [phone]

## 2018-07-10 DIAGNOSIS — R351 Nocturia: Secondary | ICD-10-CM | POA: Diagnosis not present

## 2018-07-10 DIAGNOSIS — N401 Enlarged prostate with lower urinary tract symptoms: Secondary | ICD-10-CM | POA: Diagnosis not present

## 2018-07-10 DIAGNOSIS — N5201 Erectile dysfunction due to arterial insufficiency: Secondary | ICD-10-CM | POA: Diagnosis not present

## 2018-07-10 DIAGNOSIS — R3914 Feeling of incomplete bladder emptying: Secondary | ICD-10-CM | POA: Diagnosis not present

## 2018-07-25 DIAGNOSIS — N401 Enlarged prostate with lower urinary tract symptoms: Secondary | ICD-10-CM | POA: Diagnosis not present

## 2018-08-01 DIAGNOSIS — N401 Enlarged prostate with lower urinary tract symptoms: Secondary | ICD-10-CM | POA: Diagnosis not present

## 2018-08-02 DIAGNOSIS — N401 Enlarged prostate with lower urinary tract symptoms: Secondary | ICD-10-CM | POA: Diagnosis not present

## 2018-08-05 ENCOUNTER — Encounter: Payer: Self-pay | Admitting: Family Medicine

## 2018-08-05 ENCOUNTER — Ambulatory Visit: Payer: BLUE CROSS/BLUE SHIELD | Admitting: Family Medicine

## 2018-08-05 ENCOUNTER — Ambulatory Visit (INDEPENDENT_AMBULATORY_CARE_PROVIDER_SITE_OTHER): Payer: BLUE CROSS/BLUE SHIELD | Admitting: Family Medicine

## 2018-08-05 VITALS — BP 162/84 | HR 66 | Temp 98.5°F | Resp 16 | Ht 69.0 in | Wt 223.0 lb

## 2018-08-05 DIAGNOSIS — K5900 Constipation, unspecified: Secondary | ICD-10-CM

## 2018-08-05 DIAGNOSIS — K649 Unspecified hemorrhoids: Secondary | ICD-10-CM | POA: Diagnosis not present

## 2018-08-05 MED ORDER — HYDROCORTISONE ACETATE 25 MG RE SUPP: 25.0000 mg | Freq: Two times a day (BID) | RECTAL | 0 refills | Status: DC

## 2018-08-05 MED ORDER — DILTIAZEM GEL 2 %: 1.0000 "application " | Freq: Two times a day (BID) | CUTANEOUS | 0 refills | Status: DC | PRN

## 2018-08-05 NOTE — Patient Instructions (Addendum)
Thank you for coming to the office today.  You have an inflamed hemorrhoid, which involves swollen veins on your rectum, it is very sensitive and causes your severe pain. You may experience worsening pain and bleeding with bright red blood if the hemorrhoid develops a superficial blood clot. Also you may have deeper internal hemorrhoids that can cause bleeding without as much pain. - Start using the Anusol suppository twice a day as prescribed for 1 week, given a refill if needed for flare up - continue the warm bathtub soak 1-2 times daily for next week if you can, or can try the [facility] for just your bottom - Try to stay well hydrated, avoid constipation and straining, eat a high fiber diet  May try Diltiazem gel topical ONLY on outside of rectum in sore spot  If you get significant worsening pain, rectal bleeding, or not responding to treatment, please notify our office and we will anticipate on an urgent referral to General Surgery office    For Constipation (less frequent bowel movement that can be hard dry or involve straining).  Recommend trying OTC Miralax 17g = 1 capful in large glass water once daily for now, try several days to see if working, goal is soft stool or BM 1-2 times daily, if too loose then reduce dose or try every other day. If not effective may need to increase it to 2 doses at once in AM or may do 1 in morning and 1 in afternoon/evening  - This medicine is very safe and can be used often without any problem and will not make you dehydrated. It is good for use on AS NEEDED BASIS or even MAINTENANCE therapy for longer term for several days to weeks at a time to help regulate bowel movements  Other more natural remedies or preventative treatment: - Increase hydration with water - Increase fiber in diet (high fiber foods = vegetables, leafy greens, oats/grains) - May take OTC Fiber supplement (metamucil powder or pill/gummy) - May try OTC Probiotic   Please schedule  a Follow-up Appointment to: Return in about 2 weeks (around 08/19/2018), or if symptoms worsen or fail to improve, for constipation / hemorrhoid.  If you have any other questions or concerns, please feel free to call the office or send a message through [place]. You may also schedule an earlier appointment if necessary.  Additionally, you may be receiving a survey about your experience at our office within a few days to 1 week by e-mail or mail. We value your feedback.  Nobie Putnam, DO [place]

## 2018-08-05 NOTE — Progress Notes (Signed)
Subjective:    Patient ID: Brady Sanchez, male    DOB: 1946-04-21, 72 y.o.   MRN: [id number]  Brady Sanchez is a 72 y.o. male presenting on 08/05/2018 for Hemorrhoids (onset 4 days)   HPI   HEMORRHOIDS / CONSTIPATION - Patient is s/p Urolyft procedure from Dr Yves Dill [facility]), he states he felt constipated with urinary catheter in place, and it was removed last Friday. He states felt he had to strain often with catheter in place. - Now he describes new problem with constipation with harder dryer bowel movements and hemorrhoids, describes pain for past 4 days worse with sitting down and feels "swollen" or something full at rectum. No prior treatment for hemorrhoids before. - He did try X-lax over weekend, and then he tried milk of magnesia and had some "loose stool" and large amount, and still felt like he had constipated stool, more comfortable sitting or lying on back. - Admits used trial of Preparation H with some mild relief - Admits he is still bleeding from Urolift surgery and he cannot tell if he is bleeding from rectum at this time. He has not contacted his Urologist back yet. Denies abdominal pain, nausea vomiting, dark stool or blood in stool   Depression screen [facility] 2/9 08/05/2018 07/05/2018 05/17/2018  Decreased Interest 0 0 0  Down, Depressed, Hopeless 0 0 0  PHQ - 2 Score 0 0 0    Social History   Tobacco Use  . Smoking status: Never Smoker  . Smokeless tobacco: Never Used  Substance Use Topics  . Alcohol use: Yes  . Drug use: No    Review of Systems Per HPI unless specifically indicated above     Objective:    BP (!) 162/84 (BP Location: Left Arm, Cuff Size: Normal)   Pulse 66   Temp 98.5 F (36.9 C) (Oral)   Resp 16   Ht 5\' 9"  (1.753 m)   Wt 223 lb (101.2 kg)   BMI 32.93 kg/m   Wt Readings from Last 3 Encounters:  08/05/18 223 lb (101.2 kg)  07/05/18 225 lb 12.8 oz (102.4 kg)  07/05/18 240 lb (108.9 kg)    Physical Exam    Constitutional: He is oriented to person, place, and time. He appears well-developed and well-nourished. No distress.  Well-appearing, comfortable, cooperative  HENT:  Head: Normocephalic and atraumatic.  Mouth/Throat: Oropharynx is clear and moist.  Eyes: Conjunctivae are normal. Right eye exhibits no discharge. Left eye exhibits no discharge.  Cardiovascular: Normal rate.  Pulmonary/Chest: Effort normal.  Abdominal: Soft. He exhibits no distension and no mass. There is no tenderness. There is no guarding.  Hyperactive bowel sounds  Genitourinary:  Genitourinary Comments: Rectal/DRE: Difficult external exam due to discomfort not tolerated well, no obvious swollen external hemorrhoid, possibly palpated more internal hemorrhoid again difficult to perform DRE due to patient cooperation. Some slight friable tissue possible anal fissure L side of rectum. No obvious bleeding.  Musculoskeletal: He exhibits no edema.  Neurological: He is alert and oriented to person, place, and time.  Skin: Skin is warm and dry. No rash noted. He is not diaphoretic. No erythema.  Psychiatric: He has a normal mood and affect. His behavior is normal.  Well groomed, good eye contact, normal speech and thoughts  Nursing note and vitals reviewed.     Assessment & Plan:   Problem List Items Addressed This Visit    None    Visit Diagnoses    Constipation, unspecified constipation  type    -  Primary   Hemorrhoids, unspecified hemorrhoid type       Relevant Medications   diltiazem 2 % GEL   hydrocortisone (ANUSOL-HC) 25 MG suppository      Acute suspected inflamed external hemorrhoid L sided on exam but difficult to discern, may have internal component. No active bleeding. Complicated by recent prostate surgery. Unable to appreciate deeper internal hemorrhoids. No clear evidence of anal fissure or any perineal problems, no sign of erythema or cellulitis. Failed topical preparation H OTC  Plan: 1. Start rx  Anusol-HC hydrocortisone 25mg  suppository BID for 6 days, given 1 refill if need repeat course 2. Start Sitz Baths or warm bathtub soaks to help resolve flare - Printed rx as well for Diltiazem gel topical for PRN use if concern of fissure and hemorrhoid not responding 3. Avoid constipation and straining, recommend high fiber diet, improve hydration - Start OTC Miralax treatment. Stop milk of magnesia / ex-lax 4. May take NSAID PRN 5. Caution with prolonged sitting - Note for work - return Wednesday 6. Reviewed return criteria if not improving, also advised that if significant worsening, may need General Surgery evaluation and management  Complicated by recent Urolift prostate surgery by Dr Maryan Puls [facility]) - patient has not followed back up with him post op. And he was asked to do so today if he does not improve on this treatment and possibly his symptoms may be more related to post-op prostate pain and related symptoms.   Meds ordered this encounter  Medications  . diltiazem 2 % GEL    Sig: Apply 1 application topically 2 (two) times daily as needed. Use on outside of rectum only, on sore area or break in skin    Dispense:  30 g    Refill:  0  . hydrocortisone (ANUSOL-HC) 25 MG suppository    Sig: Place 1 suppository (25 mg total) rectally 2 (two) times daily. For 6 days    Dispense:  12 suppository    Refill:  0     Follow up plan: Return in about 2 weeks (around 08/19/2018), or if symptoms worsen or fail to improve, for constipation / hemorrhoid.  Nobie Putnam, [facility] 08/05/2018, 9:43 AM

## 2018-08-06 ENCOUNTER — Telehealth: Payer: Self-pay | Admitting: Family Medicine

## 2018-08-06 DIAGNOSIS — K649 Unspecified hemorrhoids: Secondary | ICD-10-CM

## 2018-08-06 MED ORDER — HYDROCORTISONE ACE-PRAMOXINE 1-1 % RE FOAM: 1.0000 | Freq: Two times a day (BID) | RECTAL | 0 refills | Status: DC | PRN

## 2018-08-06 NOTE — Telephone Encounter (Signed)
Patient was just seen yesterday 08/05/18.  These medicines do take time to work.  He may continue Diltiazem gel topical.  If he is not having good results on Anusol Suppository. I can switch it to similar medicine but it is in a Foam application. I will send this new medicine to [place]. He can stop suppository and start new one.  He was given very specific instructions on constipation per AVS and our discussion (see below):  He needs to increase dose of Miralax if still constipated. Needs to go up to 2 capfuls daily for few days and keep increasing as tolerated until he has larger amount of soft stool. This may take several days.  Recommend trying OTC Miralax 17g = 1 capful in large glass water once daily for now, try several days to see if working, goal is soft stool or BM 1-2 times daily, if too loose then reduce dose or try every other day. If not effective may need to increase it to 2 doses at once in AM or may do 1 in morning and 1 in afternoon/evening  - This medicine is very safe and can be used often without any problem and will not make you dehydrated. It is good for use on AS NEEDED BASIS or even MAINTENANCE therapy for longer term for several days to weeks at a time to help regulate bowel movements  Nobie Putnam, [facility] 08/06/2018, 4:54 PM

## 2018-08-06 NOTE — Telephone Encounter (Signed)
Patient advised.

## 2018-08-06 NOTE — Telephone Encounter (Signed)
Per pt the medication given yesterday will not stay in. it keeps coming out every time and wants to know if there is something else that can be given for him to take.   hydrocortisone (ANUSOL-HC) 25 MG suppository [[id number]]

## 2018-08-07 DIAGNOSIS — R300 Dysuria: Secondary | ICD-10-CM | POA: Diagnosis not present

## 2018-08-07 DIAGNOSIS — R350 Frequency of micturition: Secondary | ICD-10-CM | POA: Diagnosis not present

## 2018-08-07 DIAGNOSIS — R351 Nocturia: Secondary | ICD-10-CM | POA: Diagnosis not present

## 2018-08-07 DIAGNOSIS — N401 Enlarged prostate with lower urinary tract symptoms: Secondary | ICD-10-CM | POA: Diagnosis not present

## 2018-08-21 DIAGNOSIS — R300 Dysuria: Secondary | ICD-10-CM | POA: Diagnosis not present

## 2018-08-21 DIAGNOSIS — R351 Nocturia: Secondary | ICD-10-CM | POA: Diagnosis not present

## 2018-08-21 DIAGNOSIS — N401 Enlarged prostate with lower urinary tract symptoms: Secondary | ICD-10-CM | POA: Diagnosis not present

## 2018-08-21 DIAGNOSIS — R350 Frequency of micturition: Secondary | ICD-10-CM | POA: Diagnosis not present

## 2018-08-24 ENCOUNTER — Other Ambulatory Visit: Payer: Self-pay

## 2018-08-24 ENCOUNTER — Emergency Department
Admission: EM | Admit: 2018-08-24 | Discharge: 2018-08-24 | Disposition: A | Discharge status: Home / Self Care | Payer: BLUE CROSS/BLUE SHIELD | Attending: Emergency Medicine | Admitting: Emergency Medicine

## 2018-08-24 ENCOUNTER — Emergency Department: Payer: BLUE CROSS/BLUE SHIELD

## 2018-08-24 ENCOUNTER — Encounter: Payer: Self-pay | Admitting: Emergency Medicine

## 2018-08-24 DIAGNOSIS — Z79899 Other long term (current) drug therapy: Secondary | ICD-10-CM | POA: Diagnosis not present

## 2018-08-24 DIAGNOSIS — Z96659 Presence of unspecified artificial knee joint: Secondary | ICD-10-CM | POA: Diagnosis not present

## 2018-08-24 DIAGNOSIS — E119 Type 2 diabetes mellitus without complications: Secondary | ICD-10-CM | POA: Diagnosis not present

## 2018-08-24 DIAGNOSIS — R0602 Shortness of breath: Secondary | ICD-10-CM | POA: Diagnosis not present

## 2018-08-24 DIAGNOSIS — I10 Essential (primary) hypertension: Secondary | ICD-10-CM | POA: Diagnosis not present

## 2018-08-24 DIAGNOSIS — R42 Dizziness and giddiness: Secondary | ICD-10-CM

## 2018-08-24 LAB — CBC
HEMATOCRIT: 42.4 % (ref 39.0–52.0)
Hemoglobin: 13.3 g/dL (ref 13.0–17.0)
MCH: 25.7 pg — ABNORMAL LOW (ref 26.0–34.0)
MCHC: 31.4 g/dL (ref 30.0–36.0)
MCV: 81.9 fL (ref 80.0–100.0)
NRBC: 0 % (ref 0.0–0.2)
Platelets: 267 10*3/uL (ref 150–400)
RBC: 5.18 MIL/uL (ref 4.22–5.81)
RDW: 13.2 % (ref 11.5–15.5)
WBC: 6.6 10*3/uL (ref 4.0–10.5)

## 2018-08-24 LAB — BASIC METABOLIC PANEL
Anion gap: 9 (ref 5–15)
BUN: 12 mg/dL (ref 8–23)
CO2: 23 mmol/L (ref 22–32)
CREATININE: 0.88 mg/dL (ref 0.61–1.24)
Calcium: 9 mg/dL (ref 8.9–10.3)
Chloride: 104 mmol/L (ref 98–111)
GFR calc non Af Amer: >60 mL/min (ref >60)
Glucose, Bld: 119 mg/dL — ABNORMAL HIGH (ref 70–99)
Potassium: 3.5 mmol/L (ref 3.5–5.1)
Sodium: 136 mmol/L (ref 135–145)

## 2018-08-24 LAB — URINALYSIS, COMPLETE (UACMP) WITH MICROSCOPIC
BACTERIA UA: NONE SEEN
BILIRUBIN URINE: NEGATIVE
Glucose, UA: NEGATIVE mg/dL
KETONES UR: NEGATIVE mg/dL
LEUKOCYTES UA: NEGATIVE
NITRITE: NEGATIVE
PROTEIN: NEGATIVE mg/dL
SPECIFIC GRAVITY, URINE: 1.005 (ref 1.005–1.030)
Squamous Epithelial / LPF: NONE SEEN (ref 0–5)
pH: 5 (ref 5.0–8.0)

## 2018-08-24 LAB — TROPONIN I: Troponin I: <0.03 ng/mL (ref <0.03)

## 2018-08-24 NOTE — Discharge Instructions (Addendum)
It is not clear exactly why you felt the way he felt today.  He would very much prefer not to be admitted to the hospital which is certainly your choice however does limit our ability to evaluate you further as we have discussed.  Your emergency room work-up is very reassuring.  Blood pressure is slightly elevated, we would asked that you follow closely with your cardiologist and primary care doctor for this tomorrow.  Return to the emergency room for any new or worrisome symptoms especially as he declined admission, is very important for you to be vigilant for your health.  This would mean looking out for chest pain lightheadedness numbness or weakness or other concerns.  Drink plenty of fluids today take your blood pressure medications as prescribed and see your doctor as soon as possible.  If you feel worse in any way or change your mind about admission please return to the emergency room

## 2018-08-24 NOTE — ED Triage Notes (Signed)
Pt to ed with c/o sob and dizziness acute onset at 1230 today. States the episode lasted about 30 minutes and has now stopped.  Pt alert and oriented. Denies chest pain at this time

## 2018-08-24 NOTE — ED Provider Notes (Signed)
[facility] Emergency Department Provider Note  ____________________________________________   I have reviewed the triage vital signs and the nursing notes. Where available I have reviewed prior notes and, if possible and indicated, outside hospital notes.    HISTORY  Chief Complaint Shortness of Breath and Dizziness    HPI Brady Sanchez is a 72 y.o. male who presents again for a similar complaint that he had last time.  He states he was outside moving air conditioner units around and had no exertional or other symptoms.  He skipped lunch and came inside and stood up suddenly and felt lightheaded for a few minutes.  He no longer feels lightheaded.  He feels back to baseline.  He states he had no chest pain or shortness of breath no nausea no vomiting no abdominal pain no focal numbness or weakness no headache stiff neck, no melena no bright red blood per rectum no diarrhea no hematemesis.  He states that this is similar to what he had happened to him before.  Vision states his blood pressure is always "high", and he states he has had no recent changes medications.  He states that his blood pressure is usually in the 160s to 170s he believes.    Past Medical History:  Diagnosis Date  . Hyperlipidemia   . Hypertension     Patient Active Problem List   Diagnosis Date Noted  . Hypertension 12/28/2017  . Hyperlipidemia 12/28/2017  . Type 2 diabetes mellitus with other specified complication ([place]) 20/94/7096  . 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 REPLACEMENT TOTAL KNEE      Prior to Admission medications   Medication Sig Start Date End Date Taking? Authorizing Provider  atorvastatin (LIPITOR) 20 MG tablet TAKE 1 TABLET BY MOUTH EVERY DAY FOR CHOLESTEROL 11/13/17   [provider]  diltiazem 2 % GEL Apply 1 application topically 2 (two) times daily as needed. Use on outside of  rectum only, on sore area or break in skin 08/05/18   Karamalegos, Devonne Doughty, DO  hydrochlorothiazide (MICROZIDE) 12.5 MG capsule Take 1 capsule (12.5 mg total) by mouth daily. 02/08/18   Karamalegos, Devonne Doughty, DO  hydrocortisone-pramoxine [facility]) rectal foam Place 1 applicator rectally 2 (two) times daily as needed for hemorrhoids or anal itching. 08/06/18   Karamalegos, Devonne Doughty, DO  lisinopril (PRINIVIL,ZESTRIL) 20 MG tablet Take 1 tablet (20 mg total) by mouth daily. 02/08/18   Karamalegos, Devonne Doughty, DO  metFORMIN (GLUCOPHAGE) 1000 MG tablet Take 1,000 mg by mouth 2 (two) times daily. 12/12/17   [provider]  metoprolol succinate (TOPROL-XL) 50 MG 24 hr tablet Take 1 tablet (50 mg total) by mouth daily. for high blood pressure 05/17/18   Karamalegos, Devonne Doughty, DO  triamcinolone cream (KENALOG) 0.5 % APPLY TO AFFECTED AREA EVERY DAY 05/24/18   Olin Hauser, DO    Allergies Dm-doxylamine-acetaminophen  Family History  Problem Relation Age of Onset  . Seizures Mother   . Cancer Father        lung    Social History Social History   Tobacco Use  . Smoking status: Never Smoker  . Smokeless tobacco: Never Used  Substance Use Topics  . Alcohol use: Yes  . Drug use: No    Review of Systems Constitutional: No fever/chills Eyes: No visual changes. ENT: No sore throat. No stiff neck no neck pain Cardiovascular: Denies chest pain. Respiratory: Denies shortness of breath.  Gastrointestinal:   no vomiting.  No diarrhea.  No constipation. Genitourinary: Negative for dysuria. Musculoskeletal: Negative lower extremity swelling Skin: Negative for rash. Neurological: Negative for severe headaches, focal weakness or numbness.   ____________________________________________   PHYSICAL EXAM:  VITAL SIGNS: ED Triage Vitals  Enc Vitals Group     BP 08/24/18 1320 (!) 185/100     Pulse Rate 08/24/18 1320 65     Resp 08/24/18 1320 18     Temp 08/24/18  1320 98.1 F (36.7 C)     Temp Source 08/24/18 1320 Oral     SpO2 08/24/18 1320 99 %     Weight 08/24/18 1321 223 lb 1.7 oz (101.2 kg)     Height 08/24/18 1321 5\' 9"  (1.753 m)     Head Circumference --      Peak Flow --      Pain Score 08/24/18 1321 0     Pain Loc --      Pain Edu? --      Excl. in [place]? --     Constitutional: Alert and oriented. Well appearing and in no acute distress. Eyes: Conjunctivae are normal Head: Atraumatic HEENT: No congestion/rhinnorhea. Mucous membranes are moist.  Oropharynx non-erythematous Neck:   Nontender with no meningismus, no masses, no stridor Cardiovascular: Normal rate, regular rhythm. Grossly normal heart sounds.  Good peripheral circulation. Respiratory: Normal respiratory effort.  No retractions. Lungs CTAB. Abdominal: Soft and nontender. No distention. No guarding no rebound Back:  There is no focal tenderness or step off.  there is no midline tenderness there are no lesions noted. there is no CVA tenderness  Musculoskeletal: No lower extremity tenderness, no upper extremity tenderness. No joint effusions, no DVT signs strong distal pulses no edema Neurologic cranial nerves II through XII are grossly intact 5 out of 5 strength bilateral upper and lower extremity. Finger to nose within normal limits heel to shin within normal limits, speech is normal with no word finding difficulty or dysarthria, reflexes symmetric, pupils are equally round and reactive to light, there is no pronator drift, sensation is normal, vision is intact to confrontation, gait is deferred, there is no nystagmus, normal neurologic exam Skin:  Skin is warm, dry and intact. No rash noted. Psychiatric: Mood and affect are normal. Speech and behavior are normal.  ____________________________________________   LABS (all labs ordered are listed, but only abnormal results are displayed)  Labs Reviewed  BASIC METABOLIC PANEL - Abnormal; Notable for the following components:       Result Value   Glucose, Bld 119 (*)    All other components within normal limits  CBC - Abnormal; Notable for the following components:   MCH 25.7 (*)    All other components within normal limits  URINALYSIS, COMPLETE (UACMP) WITH MICROSCOPIC - Abnormal; Notable for the following components:   Color, Urine STRAW (*)    APPearance CLEAR (*)    Hgb urine dipstick SMALL (*)    All other components within normal limits  TROPONIN I  TROPONIN I    Pertinent labs  results that were available during my care of the patient were reviewed by me and considered in my medical decision making (see chart for details). ____________________________________________  EKG  I personally interpreted any EKGs ordered by me or triage Sinus rhythm, rate 62 bpm no acute ST elevation or depression, repolarization of normality noted.  No acute ischemia. ____________________________________________  RADIOLOGY  Pertinent labs & imaging results that were available during my care of  the patient were reviewed by me and considered in my medical decision making (see chart for details). If possible, patient and/or family made aware of any abnormal findings.  Dg Chest 2 View  Result Date: 08/24/2018 CLINICAL DATA:  Dizziness and shortness of breath. EXAM: CHEST - 2 VIEW COMPARISON:  Chest x-ray dated July 05, 2018. FINDINGS: The heart size and mediastinal contours are within normal limits. Normal pulmonary vascularity. Mild interstitial prominence, similar to prior studies. No focal consolidation, pleural effusion, or pneumothorax. No acute osseous abnormality. IMPRESSION: No active cardiopulmonary disease. Electronically Signed   By: Titus Dubin M.D.   On: 08/24/2018 15:36   Ct Head Wo Contrast  Result Date: 08/24/2018 CLINICAL DATA:  Lightheadedness. EXAM: CT HEAD WITHOUT CONTRAST TECHNIQUE: Contiguous axial images were obtained from the base of the skull through the vertex without intravenous contrast.  COMPARISON:  12/12/2010 FINDINGS: Brain: No evidence of acute infarction, hemorrhage, hydrocephalus, extra-axial collection or mass lesion/mass effect. 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 There is mild diffuse low-attenuation within the subcortical and periventricular white matter compatible with chronic microvascular disease. Vascular: No hyperdense vessel or unexpected calcification. Skull: Normal. Negative for fracture or focal lesion. Sinuses/Orbits: No acute finding. Other: None IMPRESSION: 1. No acute intracranial abnormality. 2. Chronic small vessel ischemic change. Electronically Signed   By: Kerby Moors M.D.   On: 08/24/2018 17:01   ____________________________________________    PROCEDURES  Procedure(s) performed: None  Procedures  Critical Care performed: None  ____________________________________________   INITIAL IMPRESSION / ASSESSMENT AND PLAN / ED COURSE  Pertinent labs & imaging results that were available during my care of the patient were reviewed by me and considered in my medical decision making (see chart for details).  Patient had a brief period of positional lightheadedness after exerting himself outside.  He had no exertional symptoms.  Happened after he stood up rapidly after sitting down.  He has been ambulating to the department with no evidence of discomfort or distress.  He is neurologically intact he has had no chest pain or shortness of breath, he has had 2 sets of negative cardiac enzymes here, I have offered and advised admission to the hospital as this is the second time this is happened, and he declines.  He states he would like to follow-up with primary care doctor and cardiology.  He does not want any further work-up and he does not want to stay in the hospital.  I am very reassured by everything I found so far he is actually had no exertional symptoms at all in fact he has been able to exert himself with no difficulty.   Is afterwards when he is relaxing and stands up quickly he gets lightheaded sometimes.  Given that he refuses admission, we will discharge him with close outpatient follow-up return precautions and follow-up given and understood    ____________________________________________   FINAL CLINICAL IMPRESSION(S) / ED DIAGNOSES  Final diagnoses:  None      This chart was dictated using voice recognition software.  Despite best efforts to proofread,  errors can occur which can change meaning.      Schuyler Amor, MD 08/24/18 1816

## 2018-09-17 DIAGNOSIS — Z96651 Presence of right artificial knee joint: Secondary | ICD-10-CM | POA: Diagnosis not present

## 2018-09-20 DIAGNOSIS — Z96651 Presence of right artificial knee joint: Secondary | ICD-10-CM | POA: Diagnosis not present

## 2018-09-24 DIAGNOSIS — Z96651 Presence of right artificial knee joint: Secondary | ICD-10-CM | POA: Diagnosis not present

## 2018-09-27 DIAGNOSIS — Z96651 Presence of right artificial knee joint: Secondary | ICD-10-CM | POA: Diagnosis not present

## 2018-09-30 DIAGNOSIS — Z96651 Presence of right artificial knee joint: Secondary | ICD-10-CM | POA: Diagnosis not present

## 2018-10-01 DIAGNOSIS — N401 Enlarged prostate with lower urinary tract symptoms: Secondary | ICD-10-CM | POA: Diagnosis not present

## 2018-10-03 DIAGNOSIS — Z96651 Presence of right artificial knee joint: Secondary | ICD-10-CM | POA: Diagnosis not present

## 2018-10-07 DIAGNOSIS — Z96651 Presence of right artificial knee joint: Secondary | ICD-10-CM | POA: Diagnosis not present

## 2018-10-09 DIAGNOSIS — Z96651 Presence of right artificial knee joint: Secondary | ICD-10-CM | POA: Diagnosis not present

## 2018-10-16 DIAGNOSIS — R351 Nocturia: Secondary | ICD-10-CM | POA: Diagnosis not present

## 2018-10-16 DIAGNOSIS — N401 Enlarged prostate with lower urinary tract symptoms: Secondary | ICD-10-CM | POA: Diagnosis not present

## 2018-11-13 ENCOUNTER — Emergency Department: Payer: BLUE CROSS/BLUE SHIELD

## 2018-11-13 ENCOUNTER — Other Ambulatory Visit: Payer: Self-pay

## 2018-11-13 ENCOUNTER — Encounter: Payer: Self-pay | Admitting: Emergency Medicine

## 2018-11-13 ENCOUNTER — Inpatient Hospital Stay: Payer: BLUE CROSS/BLUE SHIELD

## 2018-11-13 ENCOUNTER — Inpatient Hospital Stay
Admission: EM | Admit: 2018-11-13 | Discharge: 2018-11-14 | DRG: 193 | Disposition: A | Discharge status: Home / Self Care | Payer: BLUE CROSS/BLUE SHIELD | Attending: Specialist | Admitting: Specialist

## 2018-11-13 DIAGNOSIS — J181 Lobar pneumonia, unspecified organism: Secondary | ICD-10-CM | POA: Diagnosis not present

## 2018-11-13 DIAGNOSIS — Z888 Allergy status to other drugs, medicaments and biological substances status: Secondary | ICD-10-CM | POA: Diagnosis not present

## 2018-11-13 DIAGNOSIS — Z7984 Long term (current) use of oral hypoglycemic drugs: Secondary | ICD-10-CM | POA: Diagnosis not present

## 2018-11-13 DIAGNOSIS — E785 Hyperlipidemia, unspecified: Secondary | ICD-10-CM | POA: Diagnosis not present

## 2018-11-13 DIAGNOSIS — E871 Hypo-osmolality and hyponatremia: Secondary | ICD-10-CM | POA: Diagnosis not present

## 2018-11-13 DIAGNOSIS — Z801 Family history of malignant neoplasm of trachea, bronchus and lung: Secondary | ICD-10-CM | POA: Diagnosis not present

## 2018-11-13 DIAGNOSIS — Z96659 Presence of unspecified artificial knee joint: Secondary | ICD-10-CM | POA: Diagnosis present

## 2018-11-13 DIAGNOSIS — Z79899 Other long term (current) drug therapy: Secondary | ICD-10-CM

## 2018-11-13 DIAGNOSIS — I10 Essential (primary) hypertension: Secondary | ICD-10-CM | POA: Diagnosis present

## 2018-11-13 DIAGNOSIS — X58XXXA Exposure to other specified factors, initial encounter: Secondary | ICD-10-CM | POA: Diagnosis not present

## 2018-11-13 DIAGNOSIS — T502X5A Adverse effect of carbonic-anhydrase inhibitors, benzothiadiazides and other diuretics, initial encounter: Secondary | ICD-10-CM | POA: Diagnosis not present

## 2018-11-13 DIAGNOSIS — K859 Acute pancreatitis without necrosis or infection, unspecified: Secondary | ICD-10-CM | POA: Diagnosis present

## 2018-11-13 DIAGNOSIS — J189 Pneumonia, unspecified organism: Secondary | ICD-10-CM | POA: Diagnosis present

## 2018-11-13 DIAGNOSIS — R918 Other nonspecific abnormal finding of lung field: Secondary | ICD-10-CM | POA: Diagnosis not present

## 2018-11-13 DIAGNOSIS — E119 Type 2 diabetes mellitus without complications: Secondary | ICD-10-CM | POA: Diagnosis not present

## 2018-11-13 HISTORY — DX: Type 2 diabetes mellitus without complications: E11.9

## 2018-11-13 LAB — URINALYSIS, COMPLETE (UACMP) WITH MICROSCOPIC
Bacteria, UA: NONE SEEN
Bilirubin Urine: NEGATIVE
Glucose, UA: NEGATIVE mg/dL
Hgb urine dipstick: NEGATIVE
Ketones, ur: NEGATIVE mg/dL
Leukocytes, UA: NEGATIVE
NITRITE: NEGATIVE
PROTEIN: 100 mg/dL — AB
Specific Gravity, Urine: 1.026 (ref 1.005–1.030)
pH: 5 (ref 5.0–8.0)

## 2018-11-13 LAB — CBC
HCT: 51.6 % (ref 39.0–52.0)
Hemoglobin: 16.4 g/dL (ref 13.0–17.0)
MCH: 25.3 pg — ABNORMAL LOW (ref 26.0–34.0)
MCHC: 31.8 g/dL (ref 30.0–36.0)
MCV: 79.6 fL — ABNORMAL LOW (ref 80.0–100.0)
Platelets: 329 K/uL (ref 150–400)
RBC: 6.48 MIL/uL — ABNORMAL HIGH (ref 4.22–5.81)
RDW: 14.1 % (ref 11.5–15.5)
WBC: 15.2 K/uL — ABNORMAL HIGH (ref 4.0–10.5)
nRBC: 0 % (ref 0.0–0.2)

## 2018-11-13 LAB — COMPREHENSIVE METABOLIC PANEL
ALT: 21 U/L (ref 0–44)
AST: 25 U/L (ref 15–41)
Albumin: 4.2 g/dL (ref 3.5–5.0)
Alkaline Phosphatase: 58 U/L (ref 38–126)
Anion gap: 12 (ref 5–15)
BILIRUBIN TOTAL: 0.8 mg/dL (ref 0.3–1.2)
BUN: 16 mg/dL (ref 8–23)
CALCIUM: 9.3 mg/dL (ref 8.9–10.3)
CHLORIDE: 102 mmol/L (ref 98–111)
CO2: 19 mmol/L — ABNORMAL LOW (ref 22–32)
Creatinine, Ser: 1.06 mg/dL (ref 0.61–1.24)
Glucose, Bld: 171 mg/dL — ABNORMAL HIGH (ref 70–99)
Potassium: 3.7 mmol/L (ref 3.5–5.1)
Sodium: 133 mmol/L — ABNORMAL LOW (ref 135–145)
TOTAL PROTEIN: 8 g/dL (ref 6.5–8.1)

## 2018-11-13 LAB — INFLUENZA PANEL BY PCR (TYPE A & B)
Influenza A By PCR: NEGATIVE
Influenza B By PCR: NEGATIVE

## 2018-11-13 LAB — GLUCOSE, CAPILLARY
Glucose-Capillary: 132 mg/dL — ABNORMAL HIGH (ref 70–99)
Glucose-Capillary: 140 mg/dL — ABNORMAL HIGH (ref 70–99)

## 2018-11-13 LAB — TROPONIN I: Troponin I: <0.03 ng/mL (ref <0.03)

## 2018-11-13 LAB — LIPASE, BLOOD: LIPASE: 291 U/L — AB (ref 11–51)

## 2018-11-13 MED ORDER — ENOXAPARIN SODIUM 40 MG/0.4ML ~~LOC~~ SOLN
40.0000 mg | SUBCUTANEOUS | Status: DC
  Administered 2018-11-13: 40 mg via SUBCUTANEOUS
  Filled 2018-11-13: qty 0.4

## 2018-11-13 MED ORDER — INSULIN ASPART 100 UNIT/ML ~~LOC~~ SOLN
0.0000 [IU] | Freq: Three times a day (TID) | SUBCUTANEOUS | Status: DC
  Administered 2018-11-13: 18:00:00 1 [IU] via SUBCUTANEOUS
  Filled 2018-11-13: qty 1

## 2018-11-13 MED ORDER — SODIUM CHLORIDE 0.9 % IV SOLN
500.0000 mg | Freq: Once | INTRAVENOUS | Status: AC
  Administered 2018-11-13: 500 mg via INTRAVENOUS
  Filled 2018-11-13: qty 500

## 2018-11-13 MED ORDER — SODIUM CHLORIDE 0.9% FLUSH
3.0000 mL | Freq: Two times a day (BID) | INTRAVENOUS | Status: DC
  Administered 2018-11-13 – 2018-11-14 (×2): 3 mL via INTRAVENOUS

## 2018-11-13 MED ORDER — SENNOSIDES-DOCUSATE SODIUM 8.6-50 MG PO TABS: 1.0000 | ORAL_TABLET | Freq: Every evening | ORAL | Status: DC | PRN

## 2018-11-13 MED ORDER — SODIUM CHLORIDE 0.9 % IV SOLN: 250.0000 mL | INTRAVENOUS | Status: DC | PRN

## 2018-11-13 MED ORDER — SODIUM CHLORIDE 0.9 % IV SOLN
1.0000 g | INTRAVENOUS | Status: DC
  Filled 2018-11-13: qty 10

## 2018-11-13 MED ORDER — ONDANSETRON HCL 4 MG PO TABS: 4.0000 mg | ORAL_TABLET | Freq: Four times a day (QID) | ORAL | Status: DC | PRN

## 2018-11-13 MED ORDER — ACETAMINOPHEN 325 MG PO TABS
650.0000 mg | ORAL_TABLET | Freq: Four times a day (QID) | ORAL | Status: DC | PRN
  Administered 2018-11-13: 18:00:00 650 mg via ORAL
  Filled 2018-11-13: qty 2

## 2018-11-13 MED ORDER — ONDANSETRON HCL 4 MG/2ML IJ SOLN
4.0000 mg | Freq: Once | INTRAMUSCULAR | Status: AC | PRN
  Administered 2018-11-13: 4 mg via INTRAVENOUS
  Filled 2018-11-13: qty 2

## 2018-11-13 MED ORDER — ONDANSETRON HCL 4 MG/2ML IJ SOLN: 4.0000 mg | Freq: Four times a day (QID) | INTRAMUSCULAR | Status: DC | PRN

## 2018-11-13 MED ORDER — LISINOPRIL 20 MG PO TABS
20.0000 mg | ORAL_TABLET | Freq: Every day | ORAL | Status: DC
  Administered 2018-11-14: 10:00:00 20 mg via ORAL
  Filled 2018-11-13: qty 1

## 2018-11-13 MED ORDER — HYDROCORTISONE ACE-PRAMOXINE 1-1 % RE FOAM
1.0000 | Freq: Two times a day (BID) | RECTAL | Status: DC | PRN
  Filled 2018-11-13: qty 10

## 2018-11-13 MED ORDER — ATORVASTATIN CALCIUM 20 MG PO TABS
20.0000 mg | ORAL_TABLET | Freq: Every day | ORAL | Status: DC
  Administered 2018-11-13: 18:00:00 20 mg via ORAL
  Filled 2018-11-13: qty 1

## 2018-11-13 MED ORDER — SODIUM CHLORIDE 0.9 % IV SOLN
500.0000 mg | INTRAVENOUS | Status: DC
  Filled 2018-11-13: qty 500

## 2018-11-13 MED ORDER — SODIUM CHLORIDE 0.9 % IV SOLN
1.0000 g | Freq: Once | INTRAVENOUS | Status: AC
  Administered 2018-11-13: 1 g via INTRAVENOUS
  Filled 2018-11-13: qty 10

## 2018-11-13 MED ORDER — METOPROLOL SUCCINATE ER 50 MG PO TB24
50.0000 mg | ORAL_TABLET | Freq: Every day | ORAL | Status: DC
  Administered 2018-11-14: 50 mg via ORAL
  Filled 2018-11-13: qty 1

## 2018-11-13 MED ORDER — SODIUM CHLORIDE 0.9% FLUSH: 3.0000 mL | INTRAVENOUS | Status: DC | PRN

## 2018-11-13 NOTE — ED Notes (Signed)
Patient transported to US 

## 2018-11-13 NOTE — ED Notes (Signed)
Patient transported to CT 

## 2018-11-13 NOTE — ED Provider Notes (Signed)
[facility] Emergency Department Provider Note   ____________________________________________    I have reviewed the triage vital signs and the nursing notes.   HISTORY  Chief Complaint Diarrhea and Emesis     HPI Brady Sanchez is a 73 y.o. male with a history of diabetes, hypertension who presents with diarrhea nausea and vomiting.  Does also report occasional cough.  Describes significant chills.  No fevers noted.  No abdominal pain.  Denies epigastric pain.  No history of alcoholism.  Not take anything for this.  Symptoms started yesterday.   Past Medical History:  Diagnosis Date  . Diabetes mellitus ([place])   . Hyperlipidemia   . Hypertension     Patient Active Problem List   Diagnosis Date Noted  . Pneumonia 11/13/2018  . Hypertension 12/28/2017  . Hyperlipidemia 12/28/2017  . Type 2 diabetes mellitus with other specified complication ([place]) [id number]  . 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 REPLACEMENT TOTAL KNEE      Prior to Admission medications   Medication Sig Start Date End Date Taking? Authorizing Provider  atorvastatin (LIPITOR) 20 MG tablet TAKE 1 TABLET BY MOUTH EVERY DAY FOR CHOLESTEROL 11/13/17   [provider]  diltiazem 2 % GEL Apply 1 application topically 2 (two) times daily as needed. Use on outside of rectum only, on sore area or break in skin 08/05/18   Karamalegos, Devonne Doughty, DO  hydrochlorothiazide (MICROZIDE) 12.5 MG capsule Take 1 capsule (12.5 mg total) by mouth daily. 02/08/18   Karamalegos, Devonne Doughty, DO  hydrocortisone-pramoxine [facility]) rectal foam Place 1 applicator rectally 2 (two) times daily as needed for hemorrhoids or anal itching. 08/06/18   Karamalegos, Devonne Doughty, DO  lisinopril (PRINIVIL,ZESTRIL) 20 MG tablet Take 1 tablet (20 mg total) by mouth daily. 02/08/18   Karamalegos, Devonne Doughty, DO  metFORMIN (GLUCOPHAGE)  1000 MG tablet Take 1,000 mg by mouth 2 (two) times daily. 12/12/17   [provider]  metoprolol succinate (TOPROL-XL) 50 MG 24 hr tablet Take 1 tablet (50 mg total) by mouth daily. for high blood pressure 05/17/18   Karamalegos, Devonne Doughty, DO  triamcinolone cream (KENALOG) 0.5 % APPLY TO AFFECTED AREA EVERY DAY 05/24/18   Olin Hauser, DO     Allergies Dm-doxylamine-acetaminophen  Family History  Problem Relation Age of Onset  . Seizures Mother   . Cancer Father        lung    Social History Social History   Tobacco Use  . Smoking status: Never Smoker  . Smokeless tobacco: Never Used  Substance Use Topics  . Alcohol use: Yes  . Drug use: No    Review of Systems  Constitutional: No fever Eyes: No visual changes.  ENT: No sore throat. Cardiovascular: Denies chest pain. Respiratory: As above Gastrointestinal: As above Genitourinary: Negative for dysuria. Musculoskeletal: Negative for back pain. Skin: Negative for rash. Neurological: Negative for headaches or weakness   ____________________________________________   PHYSICAL EXAM:  VITAL SIGNS: ED Triage Vitals  Enc Vitals Group     BP 11/13/18 1052 132/61     Pulse Rate 11/13/18 1052 73     Resp 11/13/18 1052 (!) 24     Temp 11/13/18 1052 98.2 F (36.8 C)     Temp Source 11/13/18 1052 Oral     SpO2 11/13/18 1052 95 %     Weight 11/13/18 1053 108.9 kg (240 lb)  Height 11/13/18 1053 1.753 m (5\' 9" )     Head Circumference --      Peak Flow --      Pain Score 11/13/18 1054 7     Pain Loc --      Pain Edu? --      Excl. in [place]? --     Constitutional: Alert and oriented.   Nose: No congestion/rhinnorhea. Mouth/Throat: Mucous membranes are moist.    Cardiovascular: Normal rate, regular rhythm. Grossly normal heart sounds.  Good peripheral circulation. Respiratory: Normal respiratory effort.  No retractions. Lungs CTAB. Gastrointestinal: Soft and nontender. No distention.  No  epigastric tenderness  Musculoskeletal: No lower extremity tenderness nor edema.  Warm and well perfused Neurologic:  Normal speech and language. No gross focal neurologic deficits are appreciated.  Skin:  Skin is warm, dry and intact. No rash noted. Psychiatric: Mood and affect are normal. Speech and behavior are normal.  ____________________________________________   LABS (all labs ordered are listed, but only abnormal results are displayed)  Labs Reviewed  LIPASE, BLOOD - Abnormal; Notable for the following components:      Result Value   Lipase 291 (*)    All other components within normal limits  COMPREHENSIVE METABOLIC PANEL - Abnormal; Notable for the following components:   Sodium 133 (*)    CO2 19 (*)    Glucose, Bld 171 (*)    All other components within normal limits  CBC - Abnormal; Notable for the following components:   WBC 15.2 (*)    RBC 6.48 (*)    MCV 79.6 (*)    MCH 25.3 (*)    All other components within normal limits  CULTURE, BLOOD (ROUTINE X 2)  CULTURE, BLOOD (ROUTINE X 2)  INFLUENZA PANEL BY PCR (TYPE A & B)  TROPONIN I  URINALYSIS, COMPLETE (UACMP) WITH MICROSCOPIC   ____________________________________________  EKG  ED ECG REPORT I, Lavonia Drafts, the attending physician, personally viewed and interpreted this ECG.  Date: 11/13/2018  Rhythm: normal sinus rhythm QRS Axis: normal Intervals: normal ST/T Wave abnormalities: Nonspecific changes Narrative Interpretation: no evidence of acute ischemia  ____________________________________________  RADIOLOGY Chest x-ray negative CT chest without contrast demonstrates multifocal pneumonia ____________________________________________   PROCEDURES  Procedure(s) performed: No  Procedures   Critical Care performed: No ____________________________________________   INITIAL IMPRESSION / ASSESSMENT AND PLAN / ED COURSE  Pertinent labs & imaging results that were available during my care  of the patient were reviewed by me and considered in my medical decision making (see chart for details).  Patient presents with nausea vomiting diarrhea, initially suspicious for gastroenteritis.  Also with a mild cough.  Chest x-ray negative for pneumonia.  Lab work significant for elevated white blood cell count.  Patient complaining of significant chills.  Given this sent for CT chest which demonstrates multifocal pneumonia, blood cultures, IV antibiotics ordered.  Discussed with the hospitalist for admission    ____________________________________________   FINAL CLINICAL IMPRESSION(S) / ED DIAGNOSES  Final diagnoses:  Community acquired pneumonia, unspecified laterality        Note:  This document was prepared using Dragon voice recognition software and may include unintentional dictation errors.   Lavonia Drafts, MD 11/13/18 [id number]

## 2018-11-13 NOTE — ED Triage Notes (Signed)
Per NT pt also told her about [facility]. Pt asked about this and admits to [facility].  New protocols placed for this.  Pt reports this just started.

## 2018-11-13 NOTE — ED Triage Notes (Addendum)
Pt started vomiting in triage. Will place IV and give meds. Denies chest pain or increased SHOB. Remains unlabored. Only c/o abdominal cramping to lower abdomen.

## 2018-11-13 NOTE — ED Notes (Signed)
Pt returning from [place].

## 2018-11-13 NOTE — Progress Notes (Signed)
Advanced care plan.  Purpose of the Encounter: CODE STATUS  Parties in Attendance: Patient and family  Patient's Decision Capacity: Good  Subjective/Patient's story: Presented to emergency room for shortness of breath, cough, nausea and vomiting   Objective/Medical story Patient was evaluated has multilobar pneumonia, pancreatitis Needs IV fluids and antibiotics Abdominal ultrasound   Goals of care determination:  Advance care directives and goals of care discussed Patient and family want everything done which includes cpre, intubation and ventilator if need arises.   CODE STATUS: Full code   Time spent discussing advanced care planning: 16 minutes

## 2018-11-13 NOTE — H&P (Signed)
[place] [facility] [place] NAME: Brady Sanchez    MR#:  [id number]  DATE OF BIRTH:  1945-12-12  DATE OF ADMISSION:  11/13/2018  PRIMARY CARE PHYSICIAN: Olin Hauser, DO   REQUESTING/REFERRING PHYSICIAN:   CHIEF COMPLAINT:   Chief Complaint  Patient presents with  . Diarrhea  . Emesis    HISTORY OF PRESENT ILLNESS: Brady Sanchez  is a 73 y.o. male with a known history of type 2 diabetes mellitus, hyperlipidemia, hypertension who works at [employer].  Patient presented to the emergency room for cough, nausea and vomiting.  Cough has been going on for the last 2 days productive of phlegm.  He also had an episode of shortness of breath.  Feels weak rundown.  Had nausea and vomiting and mild abdominal discomfort.  Drinks alcohol occasionally.  Patient was evaluated in the emergency room was found to have multilobar pneumonia on chest x-ray.  His lipase level is also elevated.  No complaints of any chest pain.  Had some low-grade fever.  Hospitalist service was consulted for further care.  Family was at the bedside in the emergency room.  PAST MEDICAL HISTORY:   Past Medical History:  Diagnosis Date  . Diabetes mellitus ([place])   . Hyperlipidemia   . Hypertension     PAST SURGICAL HISTORY:  Past Surgical History:  Procedure Laterality Date  . REPLACEMENT TOTAL KNEE      SOCIAL HISTORY:  Social History   Tobacco Use  . Smoking status: Never Smoker  . Smokeless tobacco: Never Used  Substance Use Topics  . Alcohol use: Yes    FAMILY HISTORY:  Family History  Problem Relation Age of Onset  . Seizures Mother   . Cancer Father        lung    DRUG ALLERGIES:  Allergies  Allergen Reactions  . Dm-Doxylamine-Acetaminophen Other (See Comments), Hives and Itching    REVIEW OF SYSTEMS:   CONSTITUTIONAL: Had low grade fever,has  fatigue and weakness.  EYES: No blurred or double vision.  EARS, NOSE,  AND THROAT: No tinnitus or ear pain.  RESPIRATORY: Had cough, shortness of breath, No  wheezing or hemoptysis.  CARDIOVASCULAR: No chest pain, orthopnea, edema.  GASTROINTESTINAL: Had nausea, vomiting,  abdominal pain.  No diarrhea GENITOURINARY: No dysuria, hematuria.  ENDOCRINE: No polyuria, nocturia,  HEMATOLOGY: No anemia, easy bruising or bleeding SKIN: No rash or lesion. MUSCULOSKELETAL: No joint pain or arthritis.   NEUROLOGIC: No tingling, numbness, weakness.  PSYCHIATRY: No anxiety or depression.   MEDICATIONS AT HOME:  Prior to Admission medications   Medication Sig Start Date End Date Taking? Authorizing Provider  atorvastatin (LIPITOR) 20 MG tablet TAKE 1 TABLET BY MOUTH EVERY DAY FOR CHOLESTEROL 11/13/17   [provider]  diltiazem 2 % GEL Apply 1 application topically 2 (two) times daily as needed. Use on outside of rectum only, on sore area or break in skin 08/05/18   Karamalegos, Devonne Doughty, DO  hydrochlorothiazide (MICROZIDE) 12.5 MG capsule Take 1 capsule (12.5 mg total) by mouth daily. 02/08/18   Karamalegos, Devonne Doughty, DO  hydrocortisone-pramoxine [facility]) rectal foam Place 1 applicator rectally 2 (two) times daily as needed for hemorrhoids or anal itching. 08/06/18   Karamalegos, Devonne Doughty, DO  lisinopril (PRINIVIL,ZESTRIL) 20 MG tablet Take 1 tablet (20 mg total) by mouth daily. 02/08/18   Karamalegos, Devonne Doughty, DO  metFORMIN (GLUCOPHAGE) 1000 MG tablet Take 1,000 mg by mouth 2 (two) times  daily. 12/12/17   [provider]  metoprolol succinate (TOPROL-XL) 50 MG 24 hr tablet Take 1 tablet (50 mg total) by mouth daily. for high blood pressure 05/17/18   Karamalegos, Devonne Doughty, DO  triamcinolone cream (KENALOG) 0.5 % APPLY TO AFFECTED AREA EVERY DAY 05/24/18   Karamalegos, Devonne Doughty, DO      PHYSICAL EXAMINATION:   VITAL SIGNS: Blood pressure (!) 112/91, pulse 73, temperature 97.9 F (36.6 C), temperature source Oral, resp. rate 20,  height 5\' 9"  (1.753 m), weight 108.9 kg, SpO2 94 %.  GENERAL:  73 y.o.-year-old patient lying in the bed with no acute distress.  EYES: Pupils equal, round, reactive to light and accommodation. No scleral icterus. Extraocular muscles intact.  HEENT: Head atraumatic, normocephalic. Oropharynx dry and nasopharynx clear.  NECK:  Supple, no jugular venous distention. No thyroid enlargement, no tenderness.  LUNGS: Decreased breath sounds bilaterally, rales heard in left lung No use of accessory muscles of respiration.  CARDIOVASCULAR: S1, S2 normal. No murmurs, rubs, or gallops.  ABDOMEN: Soft, mild tenderness in epigastrium, nondistended. Bowel sounds present. No organomegaly or mass.  EXTREMITIES: No pedal edema, cyanosis, or clubbing.  NEUROLOGIC: Cranial nerves II through XII are intact. Muscle strength 5/5 in all extremities. Sensation intact. Gait not checked.  PSYCHIATRIC: The patient is alert and oriented x 3.  SKIN: No obvious rash, lesion, or ulcer.   LABORATORY PANEL:   CBC Recent Labs  Lab 11/13/18 1110  WBC 15.2*  HGB 16.4  HCT 51.6  PLT 329  MCV 79.6*  MCH 25.3*  MCHC 31.8  RDW 14.1   ------------------------------------------------------------------------------------------------------------------  Chemistries  Recent Labs  Lab 11/13/18 1110  NA 133*  K 3.7  CL 102  CO2 19*  GLUCOSE 171*  BUN 16  CREATININE 1.06  CALCIUM 9.3  AST 25  ALT 21  ALKPHOS 58  BILITOT 0.8   ------------------------------------------------------------------------------------------------------------------ estimated creatinine clearance is 75.5 mL/min (by C-G formula based on SCr of 1.06 mg/dL). ------------------------------------------------------------------------------------------------------------------ No results for input(s): TSH, T4TOTAL, T3FREE, THYROIDAB in the last 72 hours.  Invalid input(s): FREET3   Coagulation profile No results for input(s): INR, PROTIME in the  last 168 hours. ------------------------------------------------------------------------------------------------------------------- No results for input(s): DDIMER in the last 72 hours. -------------------------------------------------------------------------------------------------------------------  Cardiac Enzymes Recent Labs  Lab 11/13/18 1110  TROPONINI <0.03   ------------------------------------------------------------------------------------------------------------------ Invalid input(s): POCBNP  ---------------------------------------------------------------------------------------------------------------  Urinalysis    Component Value Date/Time   COLORURINE STRAW (A) 08/24/2018 1614   APPEARANCEUR CLEAR (A) 08/24/2018 1614   LABSPEC 1.005 08/24/2018 1614   PHURINE 5.0 08/24/2018 1614   GLUCOSEU NEGATIVE 08/24/2018 1614   HGBUR SMALL (A) 08/24/2018 1614   BILIRUBINUR NEGATIVE 08/24/2018 1614   KETONESUR NEGATIVE 08/24/2018 1614   PROTEINUR NEGATIVE 08/24/2018 1614   NITRITE NEGATIVE 08/24/2018 1614   LEUKOCYTESUR NEGATIVE 08/24/2018 1614     RADIOLOGY: Dg Chest 2 View  Result Date: 11/13/2018 CLINICAL DATA:  Shortness of breath. EXAM: CHEST - 2 VIEW COMPARISON:  Chest radiograph 08/24/2018 FINDINGS: Normal cardiac and mediastinal contours. Left basilar heterogeneous opacities. No pleural effusion or pneumothorax. Thoracic spine degenerative changes. IMPRESSION: Left basilar opacities may represent atelectasis or infection. Electronically Signed   By: Lovey Newcomer M.D.   On: 11/13/2018 11:36   Ct Chest Wo Contrast  Result Date: 11/13/2018 CLINICAL DATA:  Pneumonia. Shortness of breath. EXAM: CT CHEST WITHOUT CONTRAST TECHNIQUE: Multidetector CT imaging of the chest was performed following the standard protocol without IV contrast. COMPARISON:  Chest x-ray dated 11/13/2018 FINDINGS: Cardiovascular:  No significant vascular findings except aortic atherosclerosis. Normal  heart size. No pericardial effusion. Mediastinum/Nodes: No enlarged mediastinal or axillary lymph nodes. Thyroid gland, trachea, and esophagus demonstrate no significant findings. Lungs/Pleura: There is an extensive patchy infiltrate in the left lower lobe with faint areas of infiltrate in the left upper lobe. There is a single tiny area of density in the right lower lobe as well as minimal right base atelectasis. No effusions. Upper Abdomen: 17 mm area of vague low-density in the upper pole the left kidney, incompletely visualized. This is indeterminate. Otherwise benign appearing upper abdomen. Musculoskeletal: No chest wall mass or suspicious bone lesions identified. IMPRESSION: 1. Extensive patchy infiltrate in the left lower lobe with faint areas of infiltrate in the left upper lobe and right lower lobe. 2. Indeterminate 17 mm area of low-density in the upper pole of the left kidney. Renal ultrasound recommended for further evaluation. Aortic Atherosclerosis (ICD10-I70.0). Electronically Signed   By: Lorriane Shire M.D.   On: 11/13/2018 14:40    EKG: Orders placed or performed during the hospital encounter of 11/13/18  . EKG 12-Lead  . EKG 12-Lead  . ED EKG  . ED EKG    IMPRESSION AND PLAN:  73 year old male patient with history of hypertension, hyperlipidemia, type 2 diabetes mellitus presented to the emergency room for nausea vomiting, mild abdominal discomfort, cough  -Multilobar pneumonia Start patient on IV Rocephin and Zithromax antibiotic Flu test has been negative Follow-up cultures and WBC count  -Acute pancreatitis Follow-up lipase level Clear liquid diet Check abdominal ultrasound  -Hyponatremia probably secondary to pneumonia versus thiazide diuretic Hold HCTZ Gentle IV fluid hydration  -Type II Diabetes mellitus Diet control Sliding scale coverage with insulin  -DVT prophylaxis with  lovenox daily All the records are reviewed and case discussed with ED  provider. Management plans discussed with the patient, family and they are in agreement.  CODE STATUS:Full code    TOTAL TIME TAKING CARE OF THIS PATIENT: 55 minutes.    Saundra Shelling M.D on 11/13/2018 at 3:06 PM  Between 7am to 6pm - Pager - [phone]  After 6pm go to [email or web address] - password [email or web address] [facility]  Office  [phone]  CC: Primary care physician; Olin Hauser, DO

## 2018-11-13 NOTE — ED Triage Notes (Signed)
Pt here for vomiting and diarrhea starting today. Also c/o abdominal cramping. Subjective fever.  Also has "cramping all over".  Denies being around anyone sick. Has not checked his temperature. VSS. NAD.  Unlabored. Color WNL.

## 2018-11-14 LAB — BASIC METABOLIC PANEL
Anion gap: 8 (ref 5–15)
BUN: 26 mg/dL — ABNORMAL HIGH (ref 8–23)
CO2: 22 mmol/L (ref 22–32)
Calcium: 8.2 mg/dL — ABNORMAL LOW (ref 8.9–10.3)
Chloride: 99 mmol/L (ref 98–111)
Creatinine, Ser: 1.27 mg/dL — ABNORMAL HIGH (ref 0.61–1.24)
GFR calc non Af Amer: 56 mL/min — ABNORMAL LOW (ref >60)
Glucose, Bld: 105 mg/dL — ABNORMAL HIGH (ref 70–99)
Potassium: 3.6 mmol/L (ref 3.5–5.1)
SODIUM: 129 mmol/L — AB (ref 135–145)

## 2018-11-14 LAB — GLUCOSE, CAPILLARY
GLUCOSE-CAPILLARY: 87 mg/dL (ref 70–99)
Glucose-Capillary: 101 mg/dL — ABNORMAL HIGH (ref 70–99)

## 2018-11-14 LAB — CBC
HCT: 43.5 % (ref 39.0–52.0)
Hemoglobin: 14.1 g/dL (ref 13.0–17.0)
MCH: 25.5 pg — ABNORMAL LOW (ref 26.0–34.0)
MCHC: 32.4 g/dL (ref 30.0–36.0)
MCV: 78.7 fL — ABNORMAL LOW (ref 80.0–100.0)
NRBC: 0 % (ref 0.0–0.2)
Platelets: 252 10*3/uL (ref 150–400)
RBC: 5.53 MIL/uL (ref 4.22–5.81)
RDW: 14.3 % (ref 11.5–15.5)
WBC: 9.6 10*3/uL (ref 4.0–10.5)

## 2018-11-14 LAB — LIPASE, BLOOD: Lipase: 30 U/L (ref 11–51)

## 2018-11-14 MED ORDER — LEVOFLOXACIN 750 MG PO TABS: 750.0000 mg | ORAL_TABLET | Freq: Every day | ORAL | 0 refills | Status: AC

## 2018-11-14 NOTE — Discharge Summary (Signed)
[facility] NAME: Brady Sanchez    MR#:  [id number]  DATE OF BIRTH:  Feb 11, 1946  DATE OF ADMISSION:  11/13/2018 ADMITTING PHYSICIAN: Saundra Shelling, MD  DATE OF DISCHARGE: 11/14/2018  PRIMARY CARE PHYSICIAN: Olin Hauser, DO    ADMISSION DIAGNOSIS:  Pancreatitis [K85.90] Community acquired pneumonia, unspecified laterality [J18.9]  DISCHARGE DIAGNOSIS:  Active Problems:   Pneumonia   SECONDARY DIAGNOSIS:   Past Medical History:  Diagnosis Date  . Diabetes mellitus ([place])   . Hyperlipidemia   . Hypertension     HOSPITAL COURSE:   73 year old male with past medical history of diabetes, hypertension, hyperlipidemia who presented to the hospital due to nausea, vomiting and vague abdominal pain and noted to have elevated lipase and also CT chest findings suggestive of pneumonia.  1.  Pancreatitis- this was suspected due to patient's elevated lipase and nausea vomiting.  Patient's abdominal ultrasound although was negative for acute pathology.  His LFTs were stable. -Patient was treated supportively with IV fluids, antiemetics, pain control. - His lipase has now normalized, he has no nausea vomiting and is tolerating p.o. well.  He is therefore being discharged home.  2.  Pneumonia this was incidentally noted on CT chest.  Patient clinically has no cough, shortness of breath, fever or any other associated respiratory symptoms. - Patient was treated with IV ceftriaxone, Zithromax in the hospital and now being discharged on oral Levaquin for a few more days.  3.  Essential hypertension-patient will continue her hydrochlorothiazide, lisinopril, Toprol.  4.  Diabetes type 2 without complication-patient will continue her metformin.  5. Hyperlipidemia - pt. Will cont. Atorvastatin.   DISCHARGE CONDITIONS:   Stable.   CONSULTS OBTAINED:    DRUG ALLERGIES:   Allergies  Allergen Reactions  .  Dm-Doxylamine-Acetaminophen Other (See Comments), Hives and Itching    DISCHARGE MEDICATIONS:   Allergies as of 11/14/2018      Reactions   Dm-doxylamine-acetaminophen Other (See Comments), Hives, Itching      Medication List    STOP taking these medications   diltiazem 2 % Gel   hydrocortisone-pramoxine rectal foam Commonly known as:  PROCTOFOAM-HC   triamcinolone cream 0.5 % Commonly known as:  KENALOG     TAKE these medications   atorvastatin 20 MG tablet Commonly known as:  LIPITOR TAKE 1 TABLET BY MOUTH EVERY DAY FOR CHOLESTEROL   hydrochlorothiazide 12.5 MG capsule Commonly known as:  MICROZIDE Take 1 capsule (12.5 mg total) by mouth daily.   levofloxacin 750 MG tablet Commonly known as:  LEVAQUIN Take 1 tablet (750 mg total) by mouth daily for 5 days.   lisinopril 20 MG tablet Commonly known as:  PRINIVIL,ZESTRIL Take 1 tablet (20 mg total) by mouth daily.   metFORMIN 1000 MG tablet Commonly known as:  GLUCOPHAGE Take 1,000 mg by mouth 2 (two) times daily.   metoprolol succinate 50 MG 24 hr tablet Commonly known as:  TOPROL-XL Take 1 tablet (50 mg total) by mouth daily. for high blood pressure         DISCHARGE INSTRUCTIONS:   DIET:  Regular diet and Diabetic diet  DISCHARGE CONDITION:  Stable  ACTIVITY:  Activity as tolerated  OXYGEN:  Home Oxygen: No.   Oxygen Delivery: room air  DISCHARGE LOCATION:  home   If you experience worsening of your admission symptoms, develop shortness of breath, life threatening emergency, suicidal or homicidal thoughts you must seek medical attention immediately by calling 911 or  calling your MD immediately  if symptoms less severe.  You Must read complete instructions/literature along with all the possible adverse reactions/side effects for all the Medicines you take and that have been prescribed to you. Take any new Medicines after you have completely understood and accpet all the possible adverse  reactions/side effects.   Please note  You were cared for by a hospitalist during your hospital stay. If you have any questions about your discharge medications or the care you received while you were in the hospital after you are discharged, you can call the unit and asked to speak with the hospitalist on call if the hospitalist that took care of you is not available. Once you are discharged, your primary care physician will handle any further medical issues. Please note that NO REFILLS for any discharge medications will be authorized once you are discharged, as it is imperative that you return to your primary care physician (or establish a relationship with a primary care physician if you do not have one) for your aftercare needs so that they can reassess your need for medications and monitor your lab values.     Today   Abdominal pain resolved, tolerating p.o. well.  No nausea or vomiting.  No shortness of breath cough or congestion.  Will discharge home today on oral antibiotics and outpatient follow-up with primary care physician.  VITAL SIGNS:  Blood pressure (!) 115/41, pulse 82, temperature 98.4 F (36.9 C), temperature source Oral, resp. rate 17, height 5\' 9"  (1.753 m), weight 108.9 kg, SpO2 95 %.  I/O:    Intake/Output Summary (Last 24 hours) at 11/14/2018 1346 Last data filed at 11/14/2018 0900 Gross per 24 hour  Intake 470 ml  Output 100 ml  Net 370 ml    PHYSICAL EXAMINATION:  GENERAL:  73 y.o.-year-old patient lying in the bed with no acute distress.  EYES: Pupils equal, round, reactive to light and accommodation. No scleral icterus. Extraocular muscles intact.  HEENT: Head atraumatic, normocephalic. Oropharynx and nasopharynx clear.  NECK:  Supple, no jugular venous distention. No thyroid enlargement, no tenderness.  LUNGS: Normal breath sounds bilaterally, no wheezing, rales,rhonchi. No use of accessory muscles of respiration.  CARDIOVASCULAR: S1, S2 normal. No murmurs,  rubs, or gallops.  ABDOMEN: Soft, non-tender, non-distended. Bowel sounds present. No organomegaly or mass.  EXTREMITIES: No pedal edema, cyanosis, or clubbing.  NEUROLOGIC: Cranial nerves II through XII are intact. No focal motor or sensory defecits b/l.  PSYCHIATRIC: The patient is alert and oriented x 3.  SKIN: No obvious rash, lesion, or ulcer.   DATA REVIEW:   CBC Recent Labs  Lab 11/14/18 0345  WBC 9.6  HGB 14.1  HCT 43.5  PLT 252    Chemistries  Recent Labs  Lab 11/13/18 1110 11/14/18 0345  NA 133* 129*  K 3.7 3.6  CL 102 99  CO2 19* 22  GLUCOSE 171* 105*  BUN 16 26*  CREATININE 1.06 1.27*  CALCIUM 9.3 8.2*  AST 25  --   ALT 21  --   ALKPHOS 58  --   BILITOT 0.8  --     Cardiac Enzymes Recent Labs  Lab 11/13/18 1110  TROPONINI <0.03    Microbiology Results  Results for orders placed or performed during the hospital encounter of 11/13/18  Blood culture (routine x 2)     Status: None (Preliminary result)   Collection Time: 11/13/18  4:16 PM  Result Value Ref Range Status   Specimen Description BLOOD BLOOD  RIGHT FOREARM  Final   Special Requests   Final    BOTTLES DRAWN AEROBIC AND ANAEROBIC Blood Culture adequate volume   Culture   Final    NO GROWTH < 24 HOURS Performed at [facility], [address] [place], [place] [address]    Report Status PENDING  Incomplete  Blood culture (routine x 2)     Status: None (Preliminary result)   Collection Time: 11/13/18  4:16 PM  Result Value Ref Range Status   Specimen Description BLOOD BLOOD RIGHT HAND  Final   Special Requests   Final    BOTTLES DRAWN AEROBIC AND ANAEROBIC Blood Culture results may not be optimal due to an excessive volume of blood received in culture bottles   Culture   Final    NO GROWTH < 24 HOURS Performed at [facility], [address] [place], [place] [address]    Report Status PENDING  Incomplete    RADIOLOGY:  Dg Chest 2 View  Result Date:  11/13/2018 CLINICAL DATA:  Shortness of breath. EXAM: CHEST - 2 VIEW COMPARISON:  Chest radiograph 08/24/2018 FINDINGS: Normal cardiac and mediastinal contours. Left basilar heterogeneous opacities. No pleural effusion or pneumothorax. Thoracic spine degenerative changes. IMPRESSION: Left basilar opacities may represent atelectasis or infection. Electronically Signed   By: Lovey Newcomer M.D.   On: 11/13/2018 11:36   Ct Chest Wo Contrast  Result Date: 11/13/2018 CLINICAL DATA:  Pneumonia. Shortness of breath. EXAM: CT CHEST WITHOUT CONTRAST TECHNIQUE: Multidetector CT imaging of the chest was performed following the standard protocol without IV contrast. COMPARISON:  Chest x-ray dated 11/13/2018 FINDINGS: Cardiovascular: No significant vascular findings except aortic atherosclerosis. Normal heart size. No pericardial effusion. Mediastinum/Nodes: No enlarged mediastinal or axillary lymph nodes. Thyroid gland, trachea, and esophagus demonstrate no significant findings. Lungs/Pleura: There is an extensive patchy infiltrate in the left lower lobe with faint areas of infiltrate in the left upper lobe. There is a single tiny area of density in the right lower lobe as well as minimal right base atelectasis. No effusions. Upper Abdomen: 17 mm area of vague low-density in the upper pole the left kidney, incompletely visualized. This is indeterminate. Otherwise benign appearing upper abdomen. Musculoskeletal: No chest wall mass or suspicious bone lesions identified. IMPRESSION: 1. Extensive patchy infiltrate in the left lower lobe with faint areas of infiltrate in the left upper lobe and right lower lobe. 2. Indeterminate 17 mm area of low-density in the upper pole of the left kidney. Renal ultrasound recommended for further evaluation. Aortic Atherosclerosis (ICD10-I70.0). Electronically Signed   By: Lorriane Shire M.D.   On: 11/13/2018 14:40   US Abdomen Complete  Result Date: 11/13/2018 CLINICAL DATA:  Pancreatitis.  EXAM: ABDOMEN ULTRASOUND COMPLETE COMPARISON:  None. FINDINGS: Gallbladder: No gallstones or wall thickening visualized. No sonographic Murphy sign noted by sonographer. Common bile duct: Diameter: 2.5 mm Liver: No focal lesion identified. Within normal limits in parenchymal echogenicity. Portal vein is patent on color Doppler imaging with normal direction of blood flow towards the liver. IVC: No abnormality visualized. Pancreas: Visualized portion unremarkable. Evaluation is limited due to overlying bowel gas. Spleen: Size and appearance within normal limits. Right Kidney: Length: 10.9 cm. Echogenicity within normal limits. No mass or hydronephrosis visualized. Left Kidney: Length: 11.7 cm. Echogenicity within normal limits. No hydronephrosis visualized. There is a 1.7 x 1.4 x 1.6 cm cyst in the left kidney. Abdominal aorta: No aneurysm visualized. Other findings: None. IMPRESSION: Visualized portion of the pancreas is unremarkable  but evaluation is limited limited due to overlying bowel gas. No sonographic evidence of acute cholecystitis. Left kidney cyst. Electronically Signed   By: Abelardo Diesel M.D.   On: 11/13/2018 16:36      Management plans discussed with the patient, family and they are in agreement.  CODE STATUS:     Code Status Orders  (From admission, onward)         Start     Ordered   11/13/18 1725  Full code  Continuous     11/13/18 1724        Code Status History    This patient has a current code status but no historical code status.      TOTAL TIME TAKING CARE OF THIS PATIENT: 40 minutes.    Henreitta Leber M.D on 11/14/2018 at 1:46 PM  Between 7am to 6pm - Pager - [phone]  After 6pm go to [email or web address] - Proofreader  [facility]  Office  [phone]  CC: Primary care physician; Olin Hauser, DO

## 2018-11-14 NOTE — Progress Notes (Signed)
Pt discharged via wheelchair by volunteer to the visitor's entrance

## 2018-11-14 NOTE — Progress Notes (Signed)
MD order received in [facility] to discharge pt home today; pt verbalized no c/o abd pain nor n/v; verbally reviewed AVS with pt, gave Rx to pt for Levqauin; no questions voiced at this time; verbally instructed pt to call when he is dressed in order to get a wheelchair for discharge

## 2018-11-15 ENCOUNTER — Telehealth: Payer: Self-pay

## 2018-11-15 NOTE — Telephone Encounter (Signed)
I have made the 2nd attempt to contact the patient or family member in charge, in order to follow up from recently being discharged from the hospital. I left a message on voicemail requesting a CB and reminded pt of HFU apt scheduled for 11/21/18 @ 10:40 AM. Juluis Rainier! -MM

## 2018-11-15 NOTE — Telephone Encounter (Signed)
I have made the 1st attempt to contact the patient or family member in charge, in order to follow up from recently being discharged from the hospital. I left a message on voicemail but I will make another attempt at a different time.  

## 2018-11-18 LAB — CULTURE, BLOOD (ROUTINE X 2)
Culture: NO GROWTH
Culture: NO GROWTH
Special Requests: ADEQUATE

## 2018-11-19 ENCOUNTER — Telehealth: Payer: Self-pay

## 2018-11-19 NOTE — Telephone Encounter (Signed)
EMMI Follow-up: Noted on the report that the patient didn't know who to contact if there was a change in his condition.  I left my contact information for Brady Sanchez to call me at his convenience.

## 2018-11-21 ENCOUNTER — Ambulatory Visit (INDEPENDENT_AMBULATORY_CARE_PROVIDER_SITE_OTHER): Payer: BLUE CROSS/BLUE SHIELD | Admitting: Family Medicine

## 2018-11-21 ENCOUNTER — Encounter: Payer: Self-pay | Admitting: Family Medicine

## 2018-11-21 VITALS — BP 159/67 | HR 66 | Temp 98.4°F | Resp 16 | Ht 69.0 in | Wt 225.0 lb

## 2018-11-21 DIAGNOSIS — J181 Lobar pneumonia, unspecified organism: Secondary | ICD-10-CM

## 2018-11-21 DIAGNOSIS — E785 Hyperlipidemia, unspecified: Secondary | ICD-10-CM

## 2018-11-21 DIAGNOSIS — J9801 Acute bronchospasm: Secondary | ICD-10-CM | POA: Diagnosis not present

## 2018-11-21 DIAGNOSIS — J189 Pneumonia, unspecified organism: Secondary | ICD-10-CM

## 2018-11-21 DIAGNOSIS — R93422 Abnormal radiologic findings on diagnostic imaging of left kidney: Secondary | ICD-10-CM | POA: Diagnosis not present

## 2018-11-21 MED ORDER — PREDNISONE 10 MG PO TABS: ORAL_TABLET | ORAL | 0 refills | Status: DC

## 2018-11-21 MED ORDER — ATORVASTATIN CALCIUM 20 MG PO TABS: 20.0000 mg | ORAL_TABLET | Freq: Every day | ORAL | 3 refills | Status: DC

## 2018-11-21 NOTE — Assessment & Plan Note (Signed)
RESOLVED LLL Pneumonia Clinically with significant incidentally identified LLL patchy infiltrate pneumonia on CT imaging when investigating pancreatitis at ED/hospital - s/p IV antibiotics, and PO levaquin - Secondary bronchospasm now d/t likely remote history of asthma  No further antibiotics needed - monitor course Trial on Prednisone for cough bronchospasm Return criteria given - consider follow-up chest imaging CXR vs CT if not improved

## 2018-11-21 NOTE — Patient Instructions (Addendum)
Thank you for coming to the office today.  No further antibiotics are needed.  We will treat cough and bronchospasm or tight breathing with Prednisone - follow dosage and taper down over 6 days.  I can send you a rescue inhaler if you need it - call us if you would like to try.  In future we will recommend checking a kidney picture with Ultrasound to follow-up with spot seen on Left kidney.  Next visit 4-6 weeks we can check sugar  Recommend scheduling with eye doctor - Dr Matilde Sprang or whomever, have them send [place] a copy of the Diabetic Eye Exam.  [facility] [place]. in [place] across [place] from the [facility]. [facility]  [address] [place], [place] [address]  [phone]   Please schedule a Follow-up Appointment to: Return in about 6 weeks (around 01/02/2019) for DM A1c, Foot Exam - consider order Renal US.  If you have any other questions or concerns, please feel free to call the office or send a message through [place]. You may also schedule an earlier appointment if necessary.  Additionally, you may be receiving a survey about your experience at our office within a few days to 1 week by e-mail or mail. We value your feedback.  Nobie Putnam, DO [place]

## 2018-11-21 NOTE — Progress Notes (Signed)
Subjective:    Patient ID: Brady Sanchez, male    DOB: 21-Aug-1946, 73 y.o.   MRN: [id number]  Brady Sanchez is a 73 y.o. male presenting on 11/21/2018 for Hospitalization Follow-up and Pneumonia   HPI  HOSPITAL FOLLOW-UP VISIT  Hospital/Location: [facility] Date of Admission: 11/13/18 Date of Discharge: 11/14/18 Transitions of care telephone call: attempted x 2 by Tyson Dense RN and [facility] LPN on 0/9/23 - did not reach patient.  Reason for Admission: Nausea, Vomiting, Weakness Primary (+Secondary) Diagnosis: Pancreatitis, Pneumonia (incidentally found, CAP)  - Hospital H&P and Discharge Summary have been reviewed - Patient presents today 7 days after recent hospitalization. Brief summary of recent course, patient had symptoms of acute onset nausea, vomiting and weakness for a day, hospitalized for acute pancreatitis but negative imaging, had elevated lipase, treated with IVF rehydration and anti emetic and analgesia. He was also incidentally diagnosed with pneumonia on CT patchy LLL infiltrate, also found incidental 57mm L upper pole kidney indeterminate advised repeat renal ultrasound. He was treated with IV Ceftriaxone and Azithromycin inpatient and then discharged on PO Levaquin.  - Today reports overall has done well after discharge. Symptoms of nausea vomiting abdominal symptoms have resolved. Symptoms of cough and congestion have improved but still lingering. He has some worsening cough spells at times, sometimes will have urge to cough especially if cough. - History of asthma at young age, not affecting him recently. He does not have rescue inhaler.  - New medications on discharge: Levaquin 750mg  daily for 5 days - Changes to current meds on discharge: None  Admits tight breathing and cough at times Denies any chest pain, productive cough, fever chills, body aches, abdominal pain, nausea vomiting  I have reviewed the discharge medication list, and have reconciled  the current and discharge medications today.   Current Outpatient Medications:  .  atorvastatin (LIPITOR) 20 MG tablet, Take 1 tablet (20 mg total) by mouth daily., Disp: 90 tablet, Rfl: 3 .  hydrochlorothiazide (MICROZIDE) 12.5 MG capsule, Take 1 capsule (12.5 mg total) by mouth daily., Disp: 90 capsule, Rfl: 3 .  lisinopril (PRINIVIL,ZESTRIL) 20 MG tablet, Take 1 tablet (20 mg total) by mouth daily., Disp: 90 tablet, Rfl: 3 .  metFORMIN (GLUCOPHAGE) 1000 MG tablet, Take 1,000 mg by mouth 2 (two) times daily., Disp: , Rfl: 5 .  metoprolol succinate (TOPROL-XL) 50 MG 24 hr tablet, Take 1 tablet (50 mg total) by mouth daily. for high blood pressure, Disp: 90 tablet, Rfl: 3 .  predniSONE (DELTASONE) 10 MG tablet, Take 6 tabs with breakfast Day 1, 5 tabs Day 2, 4 tabs Day 3, 3 tabs Day 4, 2 tabs Day 5, 1 tab Day 6., Disp: 21 tablet, Rfl: 0  ------------------------------------------------------------------------- Social History   Tobacco Use  . Smoking status: Never Smoker  . Smokeless tobacco: Never Used  Substance Use Topics  . Alcohol use: Yes  . Drug use: No    Review of Systems Per HPI unless specifically indicated above     Objective:    BP (!) 159/67   Pulse 66   Temp 98.4 F (36.9 C) (Oral)   Resp 16   Ht 5\' 9"  (1.753 m)   Wt 225 lb (102.1 kg)   SpO2 100%   BMI 33.23 kg/m   Wt Readings from Last 3 Encounters:  11/21/18 225 lb (102.1 kg)  11/13/18 240 lb (108.9 kg)  08/24/18 223 lb 1.7 oz (101.2 kg)    Physical Exam Vitals signs  and nursing note reviewed.  Constitutional:      General: He is not in acute distress.    Appearance: He is well-developed. He is not diaphoretic.     Comments: Well-appearing, comfortable, cooperative  HENT:     Head: Normocephalic and atraumatic.  Eyes:     General:        Right eye: No discharge.        Left eye: No discharge.     Conjunctiva/sclera: Conjunctivae normal.  Neck:     Musculoskeletal: Normal range of motion and  neck supple.     Thyroid: No thyromegaly.  Cardiovascular:     Rate and Rhythm: Normal rate and regular rhythm.     Heart sounds: Normal heart sounds. No murmur.  Pulmonary:     Effort: Pulmonary effort is normal. No respiratory distress.     Breath sounds: Normal breath sounds. No wheezing or rales.     Comments: Good air movement. No focal abnormality. Musculoskeletal: Normal range of motion.  Lymphadenopathy:     Cervical: No cervical adenopathy.  Skin:    General: Skin is warm and dry.     Findings: No erythema or rash.  Neurological:     Mental Status: He is alert and oriented to person, place, and time.  Psychiatric:        Behavior: Behavior normal.     Comments: Well groomed, good eye contact, normal speech and thoughts      I have personally reviewed the radiology report from 11/13/18 CT Chest wo contrast.  CLINICAL DATA:  Pneumonia. Shortness of breath.  EXAM: CT CHEST WITHOUT CONTRAST  TECHNIQUE: Multidetector CT imaging of the chest was performed following the standard protocol without IV contrast.  COMPARISON:  Chest x-ray dated 11/13/2018  FINDINGS: Cardiovascular: No significant vascular findings except aortic atherosclerosis. Normal heart size. No pericardial effusion.  Mediastinum/Nodes: No enlarged mediastinal or axillary lymph nodes. Thyroid gland, trachea, and esophagus demonstrate no significant findings.  Lungs/Pleura: There is an extensive patchy infiltrate in the left lower lobe with faint areas of infiltrate in the left upper lobe. There is a single tiny area of density in the right lower lobe as well as minimal right base atelectasis. No effusions.  Upper Abdomen: 17 mm area of vague low-density in the upper pole the left kidney, incompletely visualized. This is indeterminate. Otherwise benign appearing upper abdomen.  Musculoskeletal: No chest wall mass or suspicious bone lesions identified.  IMPRESSION: 1. Extensive patchy  infiltrate in the left lower lobe with faint areas of infiltrate in the left upper lobe and right lower lobe. 2. Indeterminate 17 mm area of low-density in the upper pole of the left kidney. Renal ultrasound recommended for further evaluation.  Aortic Atherosclerosis (ICD10-I70.0).   Electronically Signed   By: Lorriane Shire M.D.   On: 11/13/2018 14:40   Results for orders placed or performed during the hospital encounter of 11/13/18  Blood culture (routine x 2)  Result Value Ref Range   Specimen Description BLOOD BLOOD RIGHT FOREARM    Special Requests      BOTTLES DRAWN AEROBIC AND ANAEROBIC Blood Culture adequate volume   Culture      NO GROWTH 5 DAYS Performed at [facility], [address], [place], [place] [address]    Report Status 11/18/2018 FINAL   Blood culture (routine x 2)  Result Value Ref Range   Specimen Description BLOOD BLOOD RIGHT HAND    Special Requests      BOTTLES  DRAWN AEROBIC AND ANAEROBIC Blood Culture results may not be optimal due to an excessive volume of blood received in culture bottles   Culture      NO GROWTH 5 DAYS Performed at [facility], [address] [place], [place] [address]    Report Status 11/18/2018 FINAL   Lipase, blood  Result Value Ref Range   Lipase 291 (H) 11 - 51 U/L  Comprehensive metabolic panel  Result Value Ref Range   Sodium 133 (L) 135 - 145 mmol/L   Potassium 3.7 3.5 - 5.1 mmol/L   Chloride 102 98 - 111 mmol/L   CO2 19 (L) 22 - 32 mmol/L   Glucose, Bld 171 (H) 70 - 99 mg/dL   BUN 16 8 - 23 mg/dL   Creatinine, Ser 1.06 0.61 - 1.24 mg/dL   Calcium 9.3 8.9 - 10.3 mg/dL   Total Protein 8.0 6.5 - 8.1 g/dL   Albumin 4.2 3.5 - 5.0 g/dL   AST 25 15 - 41 U/L   ALT 21 0 - 44 U/L   Alkaline Phosphatase 58 38 - 126 U/L   Total Bilirubin 0.8 0.3 - 1.2 mg/dL   GFR calc non Af Amer >60 >60 mL/min   GFR calc Af Amer >60 >60 mL/min   Anion gap 12 5 - 15  CBC  Result Value Ref Range   WBC 15.2  (H) 4.0 - 10.5 K/uL   RBC 6.48 (H) 4.22 - 5.81 MIL/uL   Hemoglobin 16.4 13.0 - 17.0 g/dL   HCT 51.6 39.0 - 52.0 %   MCV 79.6 (L) 80.0 - 100.0 fL   MCH 25.3 (L) 26.0 - 34.0 pg   MCHC 31.8 30.0 - 36.0 g/dL   RDW 14.1 11.5 - 15.5 %   Platelets 329 150 - 400 K/uL   nRBC 0.0 0.0 - 0.2 %  Urinalysis, Complete w Microscopic  Result Value Ref Range   Color, Urine YELLOW (A) YELLOW   APPearance HAZY (A) CLEAR   Specific Gravity, Urine 1.026 1.005 - 1.030   pH 5.0 5.0 - 8.0   Glucose, UA NEGATIVE NEGATIVE mg/dL   Hgb urine dipstick NEGATIVE NEGATIVE   Bilirubin Urine NEGATIVE NEGATIVE   Ketones, ur NEGATIVE NEGATIVE mg/dL   Protein, ur 100 (A) NEGATIVE mg/dL   Nitrite NEGATIVE NEGATIVE   Leukocytes, UA NEGATIVE NEGATIVE   RBC / HPF 0-5 0 - 5 RBC/hpf   WBC, UA 0-5 0 - 5 WBC/hpf   Bacteria, UA NONE SEEN NONE SEEN   Squamous Epithelial / LPF 0-5 0 - 5   Mucus PRESENT    Hyaline Casts, UA PRESENT   Influenza panel by PCR (type A & B)  Result Value Ref Range   Influenza A By PCR NEGATIVE NEGATIVE   Influenza B By PCR NEGATIVE NEGATIVE  Troponin I - ONCE - STAT  Result Value Ref Range   Troponin I <0.03 <0.03 ng/mL  Lipase, blood  Result Value Ref Range   Lipase 30 11 - 51 U/L  Basic metabolic panel  Result Value Ref Range   Sodium 129 (L) 135 - 145 mmol/L   Potassium 3.6 3.5 - 5.1 mmol/L   Chloride 99 98 - 111 mmol/L   CO2 22 22 - 32 mmol/L   Glucose, Bld 105 (H) 70 - 99 mg/dL   BUN 26 (H) 8 - 23 mg/dL   Creatinine, Ser 1.27 (H) 0.61 - 1.24 mg/dL   Calcium 8.2 (L) 8.9 - 10.3 mg/dL  GFR calc non Af Amer 56 (L) >60 mL/min   GFR calc Af Amer >60 >60 mL/min   Anion gap 8 5 - 15  CBC  Result Value Ref Range   WBC 9.6 4.0 - 10.5 K/uL   RBC 5.53 4.22 - 5.81 MIL/uL   Hemoglobin 14.1 13.0 - 17.0 g/dL   HCT 43.5 39.0 - 52.0 %   MCV 78.7 (L) 80.0 - 100.0 fL   MCH 25.5 (L) 26.0 - 34.0 pg   MCHC 32.4 30.0 - 36.0 g/dL   RDW 14.3 11.5 - 15.5 %   Platelets 252 150 - 400 K/uL   nRBC  0.0 0.0 - 0.2 %  Glucose, capillary  Result Value Ref Range   Glucose-Capillary 132 (H) 70 - 99 mg/dL  Glucose, capillary  Result Value Ref Range   Glucose-Capillary 140 (H) 70 - 99 mg/dL  Glucose, capillary  Result Value Ref Range   Glucose-Capillary 87 70 - 99 mg/dL  Glucose, capillary  Result Value Ref Range   Glucose-Capillary 101 (H) 70 - 99 mg/dL      Assessment & Plan:   Problem List Items Addressed This Visit    Abnormal finding on diagnostic imaging of left kidney New possible problem identified on CT, small 56mm L upper pole lesion question etiology - may be incidental benign lesion - Will pursue future Renal Ultrasound when patient is clinically improved to follow-up progress within next 6 week to 3 months - advised patient    RESOLVED: Community acquired pneumonia of left lower lobe of lung ([place]) - Primary    RESOLVED LLL Pneumonia Clinically with significant incidentally identified LLL patchy infiltrate pneumonia on CT imaging when investigating pancreatitis at ED/hospital - s/p IV antibiotics, and PO levaquin - Secondary bronchospasm now d/t likely remote history of asthma  No further antibiotics needed - monitor course Trial on Prednisone for cough bronchospasm - caution DM hyperglycemia Return criteria given - consider follow-up chest imaging CXR vs CT if not improved      Relevant Medications   predniSONE (DELTASONE) 10 MG tablet   atorvastatin (LIPITOR) 20 MG tablet   Hyperlipidemia Refill today    Relevant Medications   atorvastatin (LIPITOR) 20 MG tablet    Other Visit Diagnoses    Acute bronchospasm     Secondary to recent respiratory illness CAP Improved but now has lingering bronchospasm cough, remote history of asthma Trial prednisone taper Follow-up if not improved - consider add albuterol PRN if need    Relevant Medications   predniSONE (DELTASONE) 10 MG tablet       Meds ordered this encounter  Medications  . predniSONE (DELTASONE) 10  MG tablet    Sig: Take 6 tabs with breakfast Day 1, 5 tabs Day 2, 4 tabs Day 3, 3 tabs Day 4, 2 tabs Day 5, 1 tab Day 6.    Dispense:  21 tablet    Refill:  0  . atorvastatin (LIPITOR) 20 MG tablet    Sig: Take 1 tablet (20 mg total) by mouth daily.    Dispense:  90 tablet    Refill:  3    Follow up plan: Return in about 6 weeks (around 01/02/2019) for DM A1c, Foot Exam - consider order Renal US.   Nobie Putnam, DO [facility] 11/21/2018, 11:30 AM

## 2018-11-27 ENCOUNTER — Telehealth: Payer: Self-pay | Admitting: Family Medicine

## 2018-11-27 DIAGNOSIS — J9801 Acute bronchospasm: Secondary | ICD-10-CM

## 2018-11-27 DIAGNOSIS — J00 Acute nasopharyngitis [common cold]: Secondary | ICD-10-CM

## 2018-11-27 MED ORDER — FLUTICASONE PROPIONATE 50 MCG/ACT NA SUSP: 2.0000 | Freq: Every day | NASAL | 3 refills | Status: DC

## 2018-11-27 MED ORDER — ALBUTEROL SULFATE 108 (90 BASE) MCG/ACT IN AEPB: 2.0000 | INHALATION_SPRAY | RESPIRATORY_TRACT | 2 refills | Status: DC | PRN

## 2018-11-27 NOTE — Telephone Encounter (Signed)
Called patient, sent rx Albuterol inhaler to pharmacy. I am not sure why Prednisone was ineffective. Also start Flonase nasal spray for post nasal drainage. He is not worse, just lingering cough it seems  Nobie Putnam, [facility] 11/27/2018, 3:18 PM

## 2018-11-27 NOTE — Telephone Encounter (Signed)
Pt. Called wanted to know if you would call in that inhaler that the medication ws not working [facility]   [facility]

## 2018-12-02 ENCOUNTER — Telehealth: Payer: Self-pay | Admitting: Family Medicine

## 2018-12-02 DIAGNOSIS — J189 Pneumonia, unspecified organism: Secondary | ICD-10-CM

## 2018-12-02 DIAGNOSIS — J181 Lobar pneumonia, unspecified organism: Principal | ICD-10-CM

## 2018-12-02 DIAGNOSIS — J9801 Acute bronchospasm: Secondary | ICD-10-CM

## 2018-12-02 NOTE — Telephone Encounter (Signed)
Pt said inhaler is not working.  He still has tightness in chest [phone]

## 2018-12-02 NOTE — Telephone Encounter (Signed)
Called patient, reviewed inhaler, he thinks may not be using it properly, we discussed proper use, he said it worked at first, sometimes it helps, tonight he feels a little better, but it is episodic with difficulty shortness of breath and tightness, not exertional related. He denies fever chills.  Last imaging x-ray 11/13/18. Showed likely infiltrate  I advised him that he can return to office for Chest X-ray repeat anytime this week, order is in, he can do walk in for x-ray, once reviewed we will contact him back, and if indicated he may come in for office visit for evaluation, otherwise if normal, it may just be more residual lingering cough symptoms.  Again precautions given if any worsening severe symptoms persistent dyspnea or chest pain he may seek care at hospital sooner, or office if improving.  Brady Sanchez, [facility] 12/02/2018, 5:31 PM

## 2018-12-04 ENCOUNTER — Ambulatory Visit
Admission: RE | Admit: 2018-12-04 | Discharge: 2018-12-04 | Disposition: A | Discharge status: Home / Self Care | Payer: BLUE CROSS/BLUE SHIELD | Source: Ambulatory Visit | Attending: Family Medicine | Admitting: Family Medicine

## 2018-12-04 ENCOUNTER — Other Ambulatory Visit: Payer: Self-pay | Admitting: Family Medicine

## 2018-12-04 DIAGNOSIS — J181 Lobar pneumonia, unspecified organism: Secondary | ICD-10-CM | POA: Insufficient documentation

## 2018-12-04 DIAGNOSIS — R05 Cough: Secondary | ICD-10-CM | POA: Diagnosis not present

## 2018-12-04 DIAGNOSIS — J189 Pneumonia, unspecified organism: Secondary | ICD-10-CM

## 2018-12-04 DIAGNOSIS — J9801 Acute bronchospasm: Secondary | ICD-10-CM | POA: Insufficient documentation

## 2018-12-04 MED ORDER — DOXYCYCLINE HYCLATE 100 MG PO TABS: 100.0000 mg | ORAL_TABLET | Freq: Two times a day (BID) | ORAL | 0 refills | Status: DC

## 2018-12-10 DIAGNOSIS — H2513 Age-related nuclear cataract, bilateral: Secondary | ICD-10-CM | POA: Diagnosis not present

## 2018-12-10 DIAGNOSIS — E119 Type 2 diabetes mellitus without complications: Secondary | ICD-10-CM | POA: Diagnosis not present

## 2018-12-10 DIAGNOSIS — Z7984 Long term (current) use of oral hypoglycemic drugs: Secondary | ICD-10-CM | POA: Diagnosis not present

## 2018-12-10 LAB — HM DIABETES EYE EXAM

## 2018-12-16 NOTE — Addendum Note (Signed)
Addended by: Olin Hauser on: 12/16/2018 12:17 PM   Modules accepted: Level of Service

## 2018-12-17 ENCOUNTER — Encounter: Payer: Self-pay | Admitting: Family Medicine

## 2018-12-17 DIAGNOSIS — E1169 Type 2 diabetes mellitus with other specified complication: Secondary | ICD-10-CM

## 2019-01-02 ENCOUNTER — Encounter: Payer: Self-pay | Admitting: Family Medicine

## 2019-01-02 ENCOUNTER — Ambulatory Visit (INDEPENDENT_AMBULATORY_CARE_PROVIDER_SITE_OTHER): Payer: BLUE CROSS/BLUE SHIELD | Admitting: Family Medicine

## 2019-01-02 ENCOUNTER — Other Ambulatory Visit: Payer: Self-pay | Admitting: Family Medicine

## 2019-01-02 VITALS — BP 148/70 | HR 59 | Temp 98.4°F | Resp 16 | Ht 69.0 in | Wt 230.6 lb

## 2019-01-02 DIAGNOSIS — M8949 Other hypertrophic osteoarthropathy, multiple sites: Secondary | ICD-10-CM

## 2019-01-02 DIAGNOSIS — R0989 Other specified symptoms and signs involving the circulatory and respiratory systems: Secondary | ICD-10-CM

## 2019-01-02 DIAGNOSIS — I10 Essential (primary) hypertension: Secondary | ICD-10-CM

## 2019-01-02 DIAGNOSIS — M159 Polyosteoarthritis, unspecified: Secondary | ICD-10-CM

## 2019-01-02 DIAGNOSIS — E1169 Type 2 diabetes mellitus with other specified complication: Secondary | ICD-10-CM | POA: Diagnosis not present

## 2019-01-02 DIAGNOSIS — R351 Nocturia: Secondary | ICD-10-CM

## 2019-01-02 DIAGNOSIS — E785 Hyperlipidemia, unspecified: Secondary | ICD-10-CM

## 2019-01-02 DIAGNOSIS — Z0000 Encounter for general adult medical examination without abnormal findings: Secondary | ICD-10-CM

## 2019-01-02 DIAGNOSIS — Z1159 Encounter for screening for other viral diseases: Secondary | ICD-10-CM

## 2019-01-02 DIAGNOSIS — M150 Primary generalized (osteo)arthritis: Secondary | ICD-10-CM

## 2019-01-02 DIAGNOSIS — R93422 Abnormal radiologic findings on diagnostic imaging of left kidney: Secondary | ICD-10-CM | POA: Diagnosis not present

## 2019-01-02 LAB — POCT GLYCOSYLATED HEMOGLOBIN (HGB A1C): Hemoglobin A1C: 6.6 % — AB (ref 4.0–5.6)

## 2019-01-02 NOTE — Assessment & Plan Note (Signed)
Improved control A1c down to 6.6, prior 7.9 Now resolved Pneumonia, back on medication metformin No hypoglycemia or hyperglycemia Complications - hyperlipidemia, obesity, hypothyroidism, - increases risk of future cardiovascular complications   Plan:  1. Continue current therapy - Metformin 1000mg  BID 2. Encourage improved lifestyle - low carb, low sugar diet, reduce portion size, continue improving regular exercise 3. Check CBG , bring log to next visit for review 4. Continue ACEi, Statin 5. DM Foot exam done today  6. Follow-up 3 months

## 2019-01-02 NOTE — Assessment & Plan Note (Signed)
Mild 71mm L upper pole low density, uncertain etiology, incidental finding  Reviewed CT image w/ patient Agree to monitor clinically for now - defer repeat imaging w/ Renal US at this time, he will let me know when ready to pursue this

## 2019-01-02 NOTE — Patient Instructions (Addendum)
Thank you for coming to the office today.  Recent Labs    05/13/18 01/02/19 1114  HGBA1C 7.9 6.6*    Keep up good work with lifestyle diet  We can hold off on x-ray for now still - if not better in next 4-6 weeks, CALL and we can order another chest x-ray just walk in for X-ray and we can let you know result.  May try OTC Mucinex  up to 7-10 days then stop  In future we can repeat imaging of Kidney  DUE for FASTING BLOOD WORK (no food or drink after midnight before the lab appointment, only water or coffee without cream/sugar on the morning of)  SCHEDULE "Lab Only" visit in the morning at the clinic for lab draw in 3 MONTHS   - Make sure Lab Only appointment is at about 1 week before your next appointment, so that results will be available  For Lab Results, once available within 2-3 days of blood draw, you can can log in to MyChart online to view your results and a brief explanation. Also, we can discuss results at next follow-up visit.   Please schedule a Follow-up Appointment to: Return in about 3 months (around 04/02/2019) for Annual Physical.  If you have any other questions or concerns, please feel free to call the office or send a message through [place]. You may also schedule an earlier appointment if necessary.  Additionally, you may be receiving a survey about your experience at our office within a few days to 1 week by e-mail or mail. We value your feedback.  Nobie Putnam, DO [place]

## 2019-01-02 NOTE — Progress Notes (Signed)
Subjective:    Patient ID: Brady Sanchez, male    DOB: December 03, 1945, 73 y.o.   MRN: [id number]  Brady Sanchez is a 73 y.o. male presenting on 01/02/2019 for Diabetes (btw still has some chest congestion)   HPI   CHRONIC DM, Type 2 / Obesity BMI >34 Reports no concerns. Prior A1c >7. Due today CBGs: none recently Meds: Metformin 1000mg  BID Reports good compliance. Tolerating well w/o side-effects Currently on ACEi Lifestyle: - Diet (low sugar low carb diet, trying to improve)  - Exercise (limited recently) - Last DM Eye Exam 12/10/18, negative Denies hypoglycemia, polyuria, visual changes, numbness or tingling.  FOLLOW-UP LLL PNEUMONIA / Chest Congestion Recent course treated for CAP 11/13/18 hospitalization, levaquin and prednisone course, I saw him on 1/9 with improvement, repeat prednisone course later due to residual cough and dyspnea, he later had repeat imaging chest x-ray 1/22, showed some residual but improved infiltrate, see results below. - Today doing well overall but still has chest congestion occasional cough, some productive sputum - Denies any fevers, chills sweats, chest pain, dyspnea  FOLLOW-UP INCIDENTAL L upper kidney indeterminate focal finding Recent incidental finding on CT Chest showed upper pole of L kidney 46mm low density area, uncertain if cyst or mass or other concern. The radiologist recommended ultrasound renal - discussed with patient, he again today is not interested in checking renal ultrasound prefers to hold off on this for now and re consider. - Asymptomatic.  Health Maintenance: He states never offered PNA vaccine. He prefers to wait until resolved recent PNA.  Depression screen [facility] 2/9 01/02/2019 11/21/2018 08/05/2018  Decreased Interest 0 0 0  Down, Depressed, Hopeless 0 0 0  PHQ - 2 Score 0 0 0    Social History   Tobacco Use  . Smoking status: Never Smoker  . Smokeless tobacco: Never Used  Substance Use Topics  . Alcohol use: Yes    . Drug use: No    Review of Systems Per HPI unless specifically indicated above     Objective:    BP (!) 148/70 (BP Location: Left Arm, Cuff Size: Normal)   Pulse (!) 59   Temp 98.4 F (36.9 C) (Oral)   Resp 16   Ht 5\' 9"  (1.753 m)   Wt 230 lb 9.6 oz (104.6 kg)   BMI 34.05 kg/m   Wt Readings from Last 3 Encounters:  01/02/19 230 lb 9.6 oz (104.6 kg)  11/21/18 225 lb (102.1 kg)  11/13/18 240 lb (108.9 kg)    Physical Exam Vitals signs and nursing note reviewed.  Constitutional:      General: He is not in acute distress.    Appearance: He is well-developed. He is not diaphoretic.     Comments: Well-appearing, comfortable, cooperative  HENT:     Head: Normocephalic and atraumatic.  Eyes:     General:        Right eye: No discharge.        Left eye: No discharge.     Conjunctiva/sclera: Conjunctivae normal.  Neck:     Musculoskeletal: Normal range of motion and neck supple.     Thyroid: No thyromegaly.  Cardiovascular:     Rate and Rhythm: Normal rate and regular rhythm.     Heart sounds: Normal heart sounds. No murmur.  Pulmonary:     Effort: Pulmonary effort is normal. No respiratory distress.     Breath sounds: Normal breath sounds. No wheezing or rales.     Comments: Good  air movement. No coughing. Musculoskeletal: Normal range of motion.  Lymphadenopathy:     Cervical: No cervical adenopathy.  Skin:    General: Skin is warm and dry.     Findings: No erythema or rash.  Neurological:     Mental Status: He is alert and oriented to person, place, and time.  Psychiatric:        Behavior: Behavior normal.     Comments: Well groomed, good eye contact, normal speech and thoughts      Diabetic Foot Exam - Simple   Simple Foot Form Diabetic Foot exam was performed with the following findings:  Yes 01/02/2019 11:18 AM  Visual Inspection No deformities, no ulcerations, no other skin breakdown bilaterally:  Yes Sensation Testing Intact to touch and monofilament  testing bilaterally:  Yes Pulse Check Posterior Tibialis and Dorsalis pulse intact bilaterally:  Yes Comments      Results for orders placed or performed in visit on 01/02/19  POCT HgB A1C  Result Value Ref Range   Hemoglobin A1C 6.6 (A) 4.0 - 5.6 %   Recent Labs    05/13/18 01/02/19 1114  HGBA1C 7.9 6.6*     I have personally reviewed the radiology report from 11/13/18 CT Chest wo contrast.  CLINICAL DATA:  Pneumonia. Shortness of breath.  EXAM: CT CHEST WITHOUT CONTRAST  TECHNIQUE: Multidetector CT imaging of the chest was performed following the standard protocol without IV contrast.  COMPARISON:  Chest x-ray dated 11/13/2018  FINDINGS: Cardiovascular: No significant vascular findings except aortic atherosclerosis. Normal heart size. No pericardial effusion.  Mediastinum/Nodes: No enlarged mediastinal or axillary lymph nodes. Thyroid gland, trachea, and esophagus demonstrate no significant findings.  Lungs/Pleura: There is an extensive patchy infiltrate in the left lower lobe with faint areas of infiltrate in the left upper lobe. There is a single tiny area of density in the right lower lobe as well as minimal right base atelectasis. No effusions.  Upper Abdomen: 17 mm area of vague low-density in the upper pole the left kidney, incompletely visualized. This is indeterminate. Otherwise benign appearing upper abdomen.  Musculoskeletal: No chest wall mass or suspicious bone lesions identified.  IMPRESSION: 1. Extensive patchy infiltrate in the left lower lobe with faint areas of infiltrate in the left upper lobe and right lower lobe. 2. Indeterminate 17 mm area of low-density in the upper pole of the left kidney. Renal ultrasound recommended for further evaluation.  Aortic Atherosclerosis (ICD10-I70.0).   Electronically Signed   By: Lorriane Shire M.D.   On: 11/13/2018 14:40  -------  I have personally reviewed the radiology report from  12/30/18 on Chest X-ray.  CLINICAL DATA:  Follow-up pneumonia, cough  EXAM: CHEST - 2 VIEW  COMPARISON:  CT chest dated 11/13/2018  FINDINGS: Mild left basilar opacity. Right lung is clear. No pleural effusion or pneumothorax.  The heart is normal in size.  Degenerative changes of the visualized thoracolumbar spine.  IMPRESSION: Mild left basilar opacity, likely reflecting residual infection/pneumonia when correlating with prior CT.   Electronically Signed   By: Julian Hy M.D.   On: 12/04/2018 08:41    Assessment & Plan:   Problem List Items Addressed This Visit    Abnormal finding on diagnostic imaging of left kidney    Mild 17mm L upper pole low density, uncertain etiology, incidental finding  Reviewed CT image w/ patient Agree to monitor clinically for now - defer repeat imaging w/ Renal US at this time, he will let me know when ready  to pursue this      Type 2 diabetes mellitus with other specified complication ([place]) - Primary    Improved control A1c down to 6.6, prior 7.9 Now resolved Pneumonia, back on medication metformin No hypoglycemia or hyperglycemia Complications - hyperlipidemia, obesity, hypothyroidism, - increases risk of future cardiovascular complications   Plan:  1. Continue current therapy - Metformin 1000mg  BID 2. Encourage improved lifestyle - low carb, low sugar diet, reduce portion size, continue improving regular exercise 3. Check CBG , bring log to next visit for review 4. Continue ACEi, Statin 5. DM Foot exam done today  6. Follow-up 3 months       Relevant Orders   POCT HgB A1C (Completed)    Other Visit Diagnoses    Chest congestion        Clinically significantly improved, seems resolved PNA Still some congestion Trial on Mucinex to clear Follow-up    No orders of the defined types were placed in this encounter.   Follow up plan: Return in about 3 months (around 04/02/2019) for Annual Physical.  Future  labs ordered for 04/03/19  Nobie Putnam, [facility] 01/02/2019, 11:10 AM

## 2019-01-20 DIAGNOSIS — M25461 Effusion, right knee: Secondary | ICD-10-CM | POA: Diagnosis not present

## 2019-01-20 DIAGNOSIS — M25561 Pain in right knee: Secondary | ICD-10-CM | POA: Diagnosis not present

## 2019-01-20 DIAGNOSIS — Z96651 Presence of right artificial knee joint: Secondary | ICD-10-CM | POA: Diagnosis not present

## 2019-01-20 DIAGNOSIS — M1711 Unilateral primary osteoarthritis, right knee: Secondary | ICD-10-CM | POA: Diagnosis not present

## 2019-02-03 ENCOUNTER — Telehealth: Payer: Self-pay | Admitting: Family Medicine

## 2019-02-03 NOTE — Telephone Encounter (Signed)
Pt called requesting  [facility]. Pt call back  Is [phone]

## 2019-02-04 NOTE — Telephone Encounter (Signed)
Patient advised that his refill send in 11/2018 and he can find out which one he needs refill for and we can send it.

## 2019-02-08 ENCOUNTER — Other Ambulatory Visit: Payer: Self-pay | Admitting: Family Medicine

## 2019-02-08 DIAGNOSIS — I10 Essential (primary) hypertension: Secondary | ICD-10-CM

## 2019-02-11 ENCOUNTER — Other Ambulatory Visit: Payer: Self-pay | Admitting: Family Medicine

## 2019-02-11 DIAGNOSIS — I10 Essential (primary) hypertension: Secondary | ICD-10-CM

## 2019-04-03 ENCOUNTER — Other Ambulatory Visit: Payer: Self-pay

## 2019-04-03 ENCOUNTER — Other Ambulatory Visit: Payer: BLUE CROSS/BLUE SHIELD

## 2019-04-03 DIAGNOSIS — I10 Essential (primary) hypertension: Secondary | ICD-10-CM | POA: Diagnosis not present

## 2019-04-03 DIAGNOSIS — Z1159 Encounter for screening for other viral diseases: Secondary | ICD-10-CM

## 2019-04-03 DIAGNOSIS — E785 Hyperlipidemia, unspecified: Secondary | ICD-10-CM | POA: Diagnosis not present

## 2019-04-03 DIAGNOSIS — R351 Nocturia: Secondary | ICD-10-CM | POA: Diagnosis not present

## 2019-04-03 DIAGNOSIS — Z0000 Encounter for general adult medical examination without abnormal findings: Secondary | ICD-10-CM

## 2019-04-03 DIAGNOSIS — E1169 Type 2 diabetes mellitus with other specified complication: Secondary | ICD-10-CM

## 2019-04-04 LAB — CBC WITH DIFFERENTIAL/PLATELET
Absolute Monocytes: 644 cells/uL (ref 200–950)
Basophils Absolute: 41 cells/uL (ref 0–200)
Basophils Relative: 0.7 %
Eosinophils Absolute: 157 cells/uL (ref 15–500)
Eosinophils Relative: 2.7 %
HCT: 45.5 % (ref 38.5–50.0)
Hemoglobin: 15 g/dL (ref 13.2–17.1)
Lymphs Abs: 1462 cells/uL (ref 850–3900)
MCH: 26.8 pg — ABNORMAL LOW (ref 27.0–33.0)
MCHC: 33 g/dL (ref 32.0–36.0)
MCV: 81.3 fL (ref 80.0–100.0)
MPV: 11.8 fL (ref 7.5–12.5)
Monocytes Relative: 11.1 %
Neutro Abs: 3497 cells/uL (ref 1500–7800)
Neutrophils Relative %: 60.3 %
Platelets: 277 10*3/uL (ref 140–400)
RBC: 5.6 10*6/uL (ref 4.20–5.80)
RDW: 13 % (ref 11.0–15.0)
Total Lymphocyte: 25.2 %
WBC: 5.8 10*3/uL (ref 3.8–10.8)

## 2019-04-04 LAB — LIPID PANEL
Cholesterol: 157 mg/dL (ref <200)
HDL: 41 mg/dL (ref >=40)
LDL Cholesterol (Calc): 95 mg/dL (calc)
Non-HDL Cholesterol (Calc): 116 mg/dL (calc) (ref <130)
Total CHOL/HDL Ratio: 3.8 (calc) (ref <5.0)
Triglycerides: 111 mg/dL (ref <150)

## 2019-04-04 LAB — COMPLETE METABOLIC PANEL WITH GFR
AG Ratio: 1.5 (calc) (ref 1.0–2.5)
ALT: 18 U/L (ref 9–46)
AST: 19 U/L (ref 10–35)
Albumin: 4.3 g/dL (ref 3.6–5.1)
Alkaline phosphatase (APISO): 51 U/L (ref 35–144)
BUN: 10 mg/dL (ref 7–25)
CO2: 29 mmol/L (ref 20–32)
Calcium: 9.6 mg/dL (ref 8.6–10.3)
Chloride: 99 mmol/L (ref 98–110)
Creat: 1.01 mg/dL (ref 0.70–1.18)
GFR, Est African American: 85 mL/min/{1.73_m2} (ref >=60)
GFR, Est Non African American: 73 mL/min/{1.73_m2} (ref >=60)
Globulin: 2.9 g/dL (calc) (ref 1.9–3.7)
Glucose, Bld: 139 mg/dL — ABNORMAL HIGH (ref 65–99)
Potassium: 3.9 mmol/L (ref 3.5–5.3)
Sodium: 137 mmol/L (ref 135–146)
Total Bilirubin: 0.5 mg/dL (ref 0.2–1.2)
Total Protein: 7.2 g/dL (ref 6.1–8.1)

## 2019-04-04 LAB — HEMOGLOBIN A1C
Hgb A1c MFr Bld: 7.3 % of total Hgb — ABNORMAL HIGH (ref <5.7)
Mean Plasma Glucose: 163 (calc)
eAG (mmol/L): 9 (calc)

## 2019-04-04 LAB — HEPATITIS C ANTIBODY
Hepatitis C Ab: NONREACTIVE
SIGNAL TO CUT-OFF: 0.01 (ref <1.00)

## 2019-04-04 LAB — PSA: PSA: 2.2 ng/mL (ref <=4.0)

## 2019-04-10 ENCOUNTER — Ambulatory Visit (INDEPENDENT_AMBULATORY_CARE_PROVIDER_SITE_OTHER): Payer: BLUE CROSS/BLUE SHIELD | Admitting: Family Medicine

## 2019-04-10 ENCOUNTER — Encounter: Payer: Self-pay | Admitting: Family Medicine

## 2019-04-10 ENCOUNTER — Other Ambulatory Visit: Payer: Self-pay

## 2019-04-10 VITALS — BP 160/80 | HR 63 | Temp 98.4°F | Resp 16 | Ht 69.0 in | Wt 229.0 lb

## 2019-04-10 DIAGNOSIS — Z1211 Encounter for screening for malignant neoplasm of colon: Secondary | ICD-10-CM | POA: Diagnosis not present

## 2019-04-10 DIAGNOSIS — I10 Essential (primary) hypertension: Secondary | ICD-10-CM

## 2019-04-10 DIAGNOSIS — E1169 Type 2 diabetes mellitus with other specified complication: Secondary | ICD-10-CM

## 2019-04-10 DIAGNOSIS — E785 Hyperlipidemia, unspecified: Secondary | ICD-10-CM

## 2019-04-10 DIAGNOSIS — Z0000 Encounter for general adult medical examination without abnormal findings: Secondary | ICD-10-CM | POA: Diagnosis not present

## 2019-04-10 MED ORDER — METFORMIN HCL 1000 MG PO TABS: 1000.0000 mg | ORAL_TABLET | Freq: Two times a day (BID) | ORAL | 3 refills | Status: DC

## 2019-04-10 MED ORDER — HYDROCHLOROTHIAZIDE 25 MG PO TABS: 25.0000 mg | ORAL_TABLET | Freq: Every day | ORAL | 1 refills | Status: DC

## 2019-04-10 NOTE — Progress Notes (Signed)
Subjective:    Patient ID: Brady Sanchez, male    DOB: 05/20/1946, 73 y.o.   MRN: [id number]  Brady Sanchez is a 73 y.o. male presenting on 04/10/2019 for Annual Exam  Here for Annual Physical and Lab Result Review.  HPI   CHRONIC DM, Type 2 / Obesity BMI >33 No new concerns. Last A1c 7.3, previously 6.6. attributed to lifestyle diet CBGs: none recently Meds: Metformin 1000mg  BID Reports good compliance (rarely missed dose about 3 x a month). Tolerating well w/o side-effects Currently on ACEi Lifestyle: - Diet (low sugar low carb diet, trying to improve)  - Exercise (limited recently) - Last DM Eye Exam 12/10/18, negative Denies hypoglycemia, polyuria, visual changes, numbness or tingling.  CHRONIC HTN: Reports not checking BP regularly he does not have a cuff. Office BP readings have been elevated Current Meds - Lisinopril 20mg  daily, HCTZ 12.5mg  daily, Metoprolol XL 50mg  daily   Reports good compliance, took meds today. Tolerating well, w/o complaints. Admits occasional headache Denies CP, dyspnea, edema, dizziness / lightheadedness   Health Maintenance:  Hep C UTD with negative antibody, not updated on chart as of now.  Colon CA Screening: Never had colonoscopy by his report. Currently asymptomatic. No known family history of colon CA. Due for screening test proceed w/ cologuard  Prostate CA Screening: No prior prostate CA screening on file. Prior PSA / DRE reported normal. Last PSA 2.2 (03/2019). Currently asymptomatic. No known family history of prostate CA.    Depression screen [facility] 2/9 04/10/2019 01/02/2019 11/21/2018  Decreased Interest 0 0 0  Down, Depressed, Hopeless 0 0 0  PHQ - 2 Score 0 0 0    Past Medical History:  Diagnosis Date  . Hyperlipidemia   . Hypertension    Past Surgical History:  Procedure Laterality Date  . REPLACEMENT TOTAL KNEE     Social History   Socioeconomic History  . Marital status: Widowed    Spouse name: Not on file  .  Number of children: Not on file  . Years of education: Not on file  . Highest education level: Not on file  Occupational History  . Occupation: works at [employer]  . Financial resource strain: Not on file  . Food insecurity:    Worry: Not on file    Inability: Not on file  . Transportation needs:    Medical: Not on file    Non-medical: Not on file  Tobacco Use  . Smoking status: Never Smoker  . Smokeless tobacco: Never Used  Substance and Sexual Activity  . Alcohol use: Yes  . Drug use: No  . Sexual activity: Not on file  Lifestyle  . Physical activity:    Days per week: Not on file    Minutes per session: Not on file  . Stress: Not on file  Relationships  . Social connections:    Talks on phone: Not on file    Gets together: Not on file    Attends religious service: Not on file    Active member of club or organization: Not on file    Attends meetings of clubs or organizations: Not on file    Relationship status: Not on file  . Intimate partner violence:    Fear of current or ex partner: Not on file    Emotionally abused: Not on file    Physically abused: Not on file    Forced sexual activity: Not on file  Other Topics  Concern  . Not on file  Social History Narrative  . Not on file   Family History  Problem Relation Age of Onset  . Seizures Mother   . Cancer Father        lung   Current Outpatient Medications on File Prior to Visit  Medication Sig  . Albuterol Sulfate (PROAIR RESPICLICK) 962 (90 Base) MCG/ACT AEPB Inhale 2 puffs into the lungs every 4 (four) hours as needed.  Marland Kitchen atorvastatin (LIPITOR) 20 MG tablet Take 1 tablet (20 mg total) by mouth daily.  . fluticasone (FLONASE) 50 MCG/ACT nasal spray Place 2 sprays into both nostrils daily. Use for 4-6 weeks then stop and use seasonally or as needed.  Marland Kitchen lisinopril (PRINIVIL,ZESTRIL) 20 MG tablet TAKE 1 TABLET BY MOUTH EVERY DAY  . metoprolol succinate (TOPROL-XL) 50 MG 24 hr  tablet Take 1 tablet (50 mg total) by mouth daily. for high blood pressure   No current facility-administered medications on file prior to visit.     Review of Systems  Constitutional: Negative for activity change, appetite change, chills, diaphoresis, fatigue and fever.  HENT: Negative for congestion and hearing loss.   Eyes: Negative for visual disturbance.  Respiratory: Negative for apnea, cough, chest tightness, shortness of breath and wheezing.   Cardiovascular: Negative for chest pain, palpitations and leg swelling.  Gastrointestinal: Negative for abdominal pain, anal bleeding, blood in stool, constipation, diarrhea, nausea and vomiting.  Endocrine: Negative for cold intolerance.  Genitourinary: Negative for difficulty urinating, dysuria, frequency and hematuria.  Musculoskeletal: Negative for arthralgias, back pain and neck pain.  Skin: Negative for rash.  Allergic/Immunologic: Negative for environmental allergies.  Neurological: Negative for dizziness, weakness, light-headedness, numbness and headaches.  Hematological: Negative for adenopathy.  Psychiatric/Behavioral: Negative for behavioral problems, dysphoric mood and sleep disturbance. The patient is not nervous/anxious.    Per HPI unless specifically indicated above      Objective:    BP (!) 160/80 (BP Location: Left Arm)   Pulse 63   Temp 98.4 F (36.9 C) (Oral)   Resp 16   Ht 5\' 9"  (1.753 m)   Wt 229 lb (103.9 kg)   BMI 33.82 kg/m   Wt Readings from Last 3 Encounters:  04/10/19 229 lb (103.9 kg)  01/02/19 230 lb 9.6 oz (104.6 kg)  11/21/18 225 lb (102.1 kg)    Physical Exam Vitals signs and nursing note reviewed.  Constitutional:      General: He is not in acute distress.    Appearance: He is well-developed. He is not diaphoretic.     Comments: Well-appearing, comfortable, cooperative  HENT:     Head: Normocephalic and atraumatic.  Eyes:     General:        Right eye: No discharge.        Left eye: No  discharge.     Conjunctiva/sclera: Conjunctivae normal.     Pupils: Pupils are equal, round, and reactive to light.  Neck:     Musculoskeletal: Normal range of motion and neck supple.     Thyroid: No thyromegaly.     Comments: Carotid normal, no bruit Cardiovascular:     Rate and Rhythm: Normal rate and regular rhythm.     Heart sounds: Normal heart sounds. No murmur.  Pulmonary:     Effort: Pulmonary effort is normal. No respiratory distress.     Breath sounds: Normal breath sounds. No wheezing or rales.  Abdominal:     General: Bowel sounds are normal. There is no  distension.     Palpations: Abdomen is soft. There is no mass.     Tenderness: There is no abdominal tenderness.  Musculoskeletal: Normal range of motion.        General: No tenderness.     Comments: Upper / Lower Extremities: - Normal muscle tone, strength bilateral upper extremities 5/5, lower extremities 5/5  Lymphadenopathy:     Cervical: No cervical adenopathy.  Skin:    General: Skin is warm and dry.     Findings: No erythema or rash.  Neurological:     Mental Status: He is alert and oriented to person, place, and time.     Comments: Distal sensation intact to light touch all extremities  Psychiatric:        Behavior: Behavior normal.     Comments: Well groomed, good eye contact, normal speech and thoughts    Recent Labs    05/13/18 01/02/19 1114 04/03/19 0808  HGBA1C 7.9 6.6* 7.3*     Results for orders placed or performed in visit on 04/03/19  Hepatitis C antibody  Result Value Ref Range   Hepatitis C Ab NON-REACTIVE NON-REACTI   SIGNAL TO CUT-OFF 0.01 <1.00  PSA  Result Value Ref Range   PSA 2.2 < OR = 4.0 ng/mL  Lipid panel  Result Value Ref Range   Cholesterol 157 <200 mg/dL   HDL 41 > OR = 40 mg/dL   Triglycerides 111 <150 mg/dL   LDL Cholesterol (Calc) 95 mg/dL (calc)   Total CHOL/HDL Ratio 3.8 <5.0 (calc)   Non-HDL Cholesterol (Calc) 116 <130 mg/dL (calc)  COMPLETE METABOLIC PANEL  WITH GFR  Result Value Ref Range   Glucose, Bld 139 (H) 65 - 99 mg/dL   BUN 10 7 - 25 mg/dL   Creat 1.01 0.70 - 1.18 mg/dL   GFR, Est Non African American 73 > OR = 60 mL/min/1.91m2   GFR, Est African American 85 > OR = 60 mL/min/1.21m2   BUN/Creatinine Ratio NOT APPLICABLE 6 - 22 (calc)   Sodium 137 135 - 146 mmol/L   Potassium 3.9 3.5 - 5.3 mmol/L   Chloride 99 98 - 110 mmol/L   CO2 29 20 - 32 mmol/L   Calcium 9.6 8.6 - 10.3 mg/dL   Total Protein 7.2 6.1 - 8.1 g/dL   Albumin 4.3 3.6 - 5.1 g/dL   Globulin 2.9 1.9 - 3.7 g/dL (calc)   AG Ratio 1.5 1.0 - 2.5 (calc)   Total Bilirubin 0.5 0.2 - 1.2 mg/dL   Alkaline phosphatase (APISO) 51 35 - 144 U/L   AST 19 10 - 35 U/L   ALT 18 9 - 46 U/L  CBC with Differential/Platelet  Result Value Ref Range   WBC 5.8 3.8 - 10.8 Thousand/uL   RBC 5.60 4.20 - 5.80 Million/uL   Hemoglobin 15.0 13.2 - 17.1 g/dL   HCT 45.5 38.5 - 50.0 %   MCV 81.3 80.0 - 100.0 fL   MCH 26.8 (L) 27.0 - 33.0 pg   MCHC 33.0 32.0 - 36.0 g/dL   RDW 13.0 11.0 - 15.0 %   Platelets 277 140 - 400 Thousand/uL   MPV 11.8 7.5 - 12.5 fL   Neutro Abs 3,497 1,500 - 7,800 cells/uL   Lymphs Abs 1,462 850 - 3,900 cells/uL   Absolute Monocytes 644 200 - 950 cells/uL   Eosinophils Absolute 157 15 - 500 cells/uL   Basophils Absolute 41 0 - 200 cells/uL   Neutrophils Relative % 60.3 %   Total  Lymphocyte 25.2 %   Monocytes Relative 11.1 %   Eosinophils Relative 2.7 %   Basophils Relative 0.7 %  Hemoglobin A1c  Result Value Ref Range   Hgb A1c MFr Bld 7.3 (H) <5.7 % of total Hgb   Mean Plasma Glucose 163 (calc)   eAG (mmol/L) 9.0 (calc)      Assessment & Plan:   Problem List Items Addressed This Visit    Hyperlipidemia    Controlled cholesterol on statin and lifestyle Last lipid panel 03/2019  Plan: 1. Continue current meds - Atorvastatin 20mg  2. Encourage improved lifestyle - low carb/cholesterol, reduce portion size, continue improving regular exercise       Relevant Medications   hydrochlorothiazide (HYDRODIURIL) 25 MG tablet   Hypertension    Still seems inadequately controlled HTN on current regimen - Home BP readings none available  No known complications   Plan:  - INCREASE HCTZ 12.5 up to 25mg  daily new rx Continue current BP regimen - refilled Metoprolol XL 50mg  daily, Lisinopril 20mg  daily, HCTZ 12.5mg  cap daily 2. Encourage improved lifestyle - emphasized importance of low sodium diet, regular exercise 3. Start monitor BP outside office, bring readings to next visit, if persistently >140/90 or new symptoms notify office sooner 4. Follow-up 3 months      Relevant Medications   hydrochlorothiazide (HYDRODIURIL) 25 MG tablet   Type 2 diabetes mellitus with other specified complication (HCC)    E5I up to 7.3, elevated from previous, still controlled in T2DM No hypoglycemia or hyperglycemia Complications - hyperlipidemia, obesity, hypothyroidism, - increases risk of future cardiovascular complications   Plan:  1. Continue current therapy - Metformin 1000mg  BID 2. Encourage improved lifestyle - low carb, low sugar diet, reduce portion size, continue improving regular exercise 3. Check CBG , bring log to next visit for review 4. Continue ACEi, Statin 5. Follow-up 3 months       Relevant Medications   metFORMIN (GLUCOPHAGE) 1000 MG tablet    Other Visit Diagnoses    Annual physical exam    -  Primary   Screening for colon cancer       Relevant Orders   Cologuard      Updated Health Maintenance information - Ordered Cologuard for Colon CA Screening Reviewed recent lab results with patient Encouraged improvement to lifestyle with diet and exercise - Goal of weight loss   Meds ordered this encounter  Medications  . metFORMIN (GLUCOPHAGE) 1000 MG tablet    Sig: Take 1 tablet (1,000 mg total) by mouth 2 (two) times daily with a meal.    Dispense:  180 tablet    Refill:  3  . hydrochlorothiazide (HYDRODIURIL) 25 MG tablet     Sig: Take 1 tablet (25 mg total) by mouth daily.    Dispense:  90 tablet    Refill:  1    Follow up plan: Return in about 3 months (around 07/11/2019) for HTN, DM A1c.  Nobie Putnam, [facility] 04/10/2019, 8:36 AM

## 2019-04-10 NOTE — Patient Instructions (Addendum)
Thank you for coming to the office today.  Increased Hydrochlorothiazide from 12.5mg  capsule up to 25mg  tablet take once daily, switch out old med for new  Continue other meds  Refilled metformin  Try to get a blood pressure cuff to monitor BP readings at home, goal < 140 / 90  Please schedule a Follow-up Appointment to: Return in about 3 months (around 07/11/2019) for HTN, DM A1c.  If you have any other questions or concerns, please feel free to call the office or send a message through [place]. You may also schedule an earlier appointment if necessary.  Additionally, you may be receiving a survey about your experience at our office within a few days to 1 week by e-mail or mail. We value your feedback.  Nobie Putnam, DO [place]

## 2019-04-10 NOTE — Assessment & Plan Note (Signed)
Controlled cholesterol on statin and lifestyle Last lipid panel 03/2019  Plan: 1. Continue current meds - Atorvastatin 20mg  2. Encourage improved lifestyle - low carb/cholesterol, reduce portion size, continue improving regular exercise

## 2019-04-10 NOTE — Assessment & Plan Note (Signed)
Still seems inadequately controlled HTN on current regimen - Home BP readings none available  No known complications   Plan:  - INCREASE HCTZ 12.5 up to 25mg  daily new rx Continue current BP regimen - refilled Metoprolol XL 50mg  daily, Lisinopril 20mg  daily, HCTZ 12.5mg  cap daily 2. Encourage improved lifestyle - emphasized importance of low sodium diet, regular exercise 3. Start monitor BP outside office, bring readings to next visit, if persistently >140/90 or new symptoms notify office sooner 4. Follow-up 3 months

## 2019-04-10 NOTE — Assessment & Plan Note (Signed)
A1c up to 7.3, elevated from previous, still controlled in T2DM No hypoglycemia or hyperglycemia Complications - hyperlipidemia, obesity, hypothyroidism, - increases risk of future cardiovascular complications   Plan:  1. Continue current therapy - Metformin 1000mg  BID 2. Encourage improved lifestyle - low carb, low sugar diet, reduce portion size, continue improving regular exercise 3. Check CBG , bring log to next visit for review 4. Continue ACEi, Statin 5. Follow-up 3 months

## 2019-04-16 IMAGING — CR DG CHEST 2V
2 series · 2 of 2 positions shown · non-contrast
Comparison: 01/31/2012.

CLINICAL DATA: Shortness of breath.

EXAM:
CHEST - 2 VIEW

[chest pa]
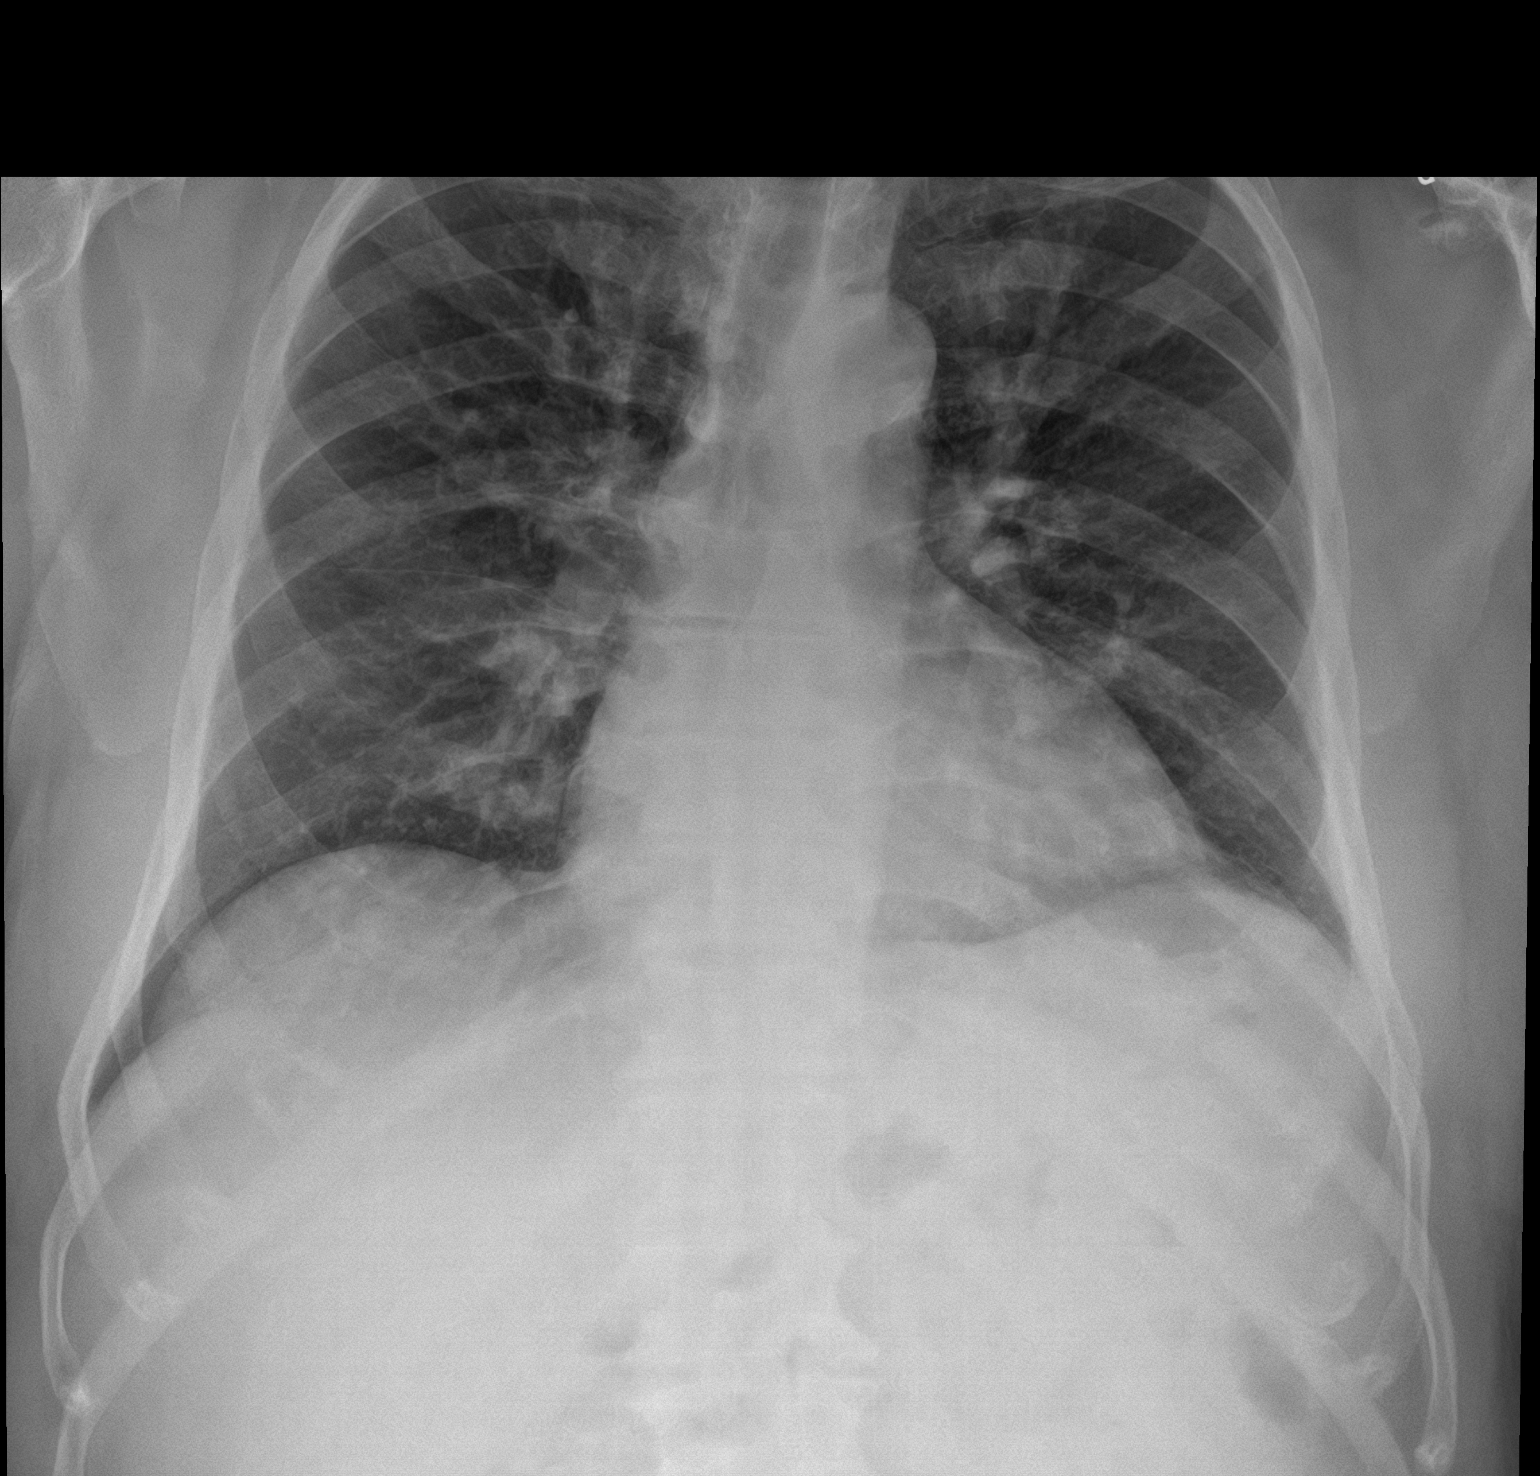

[chest lat]
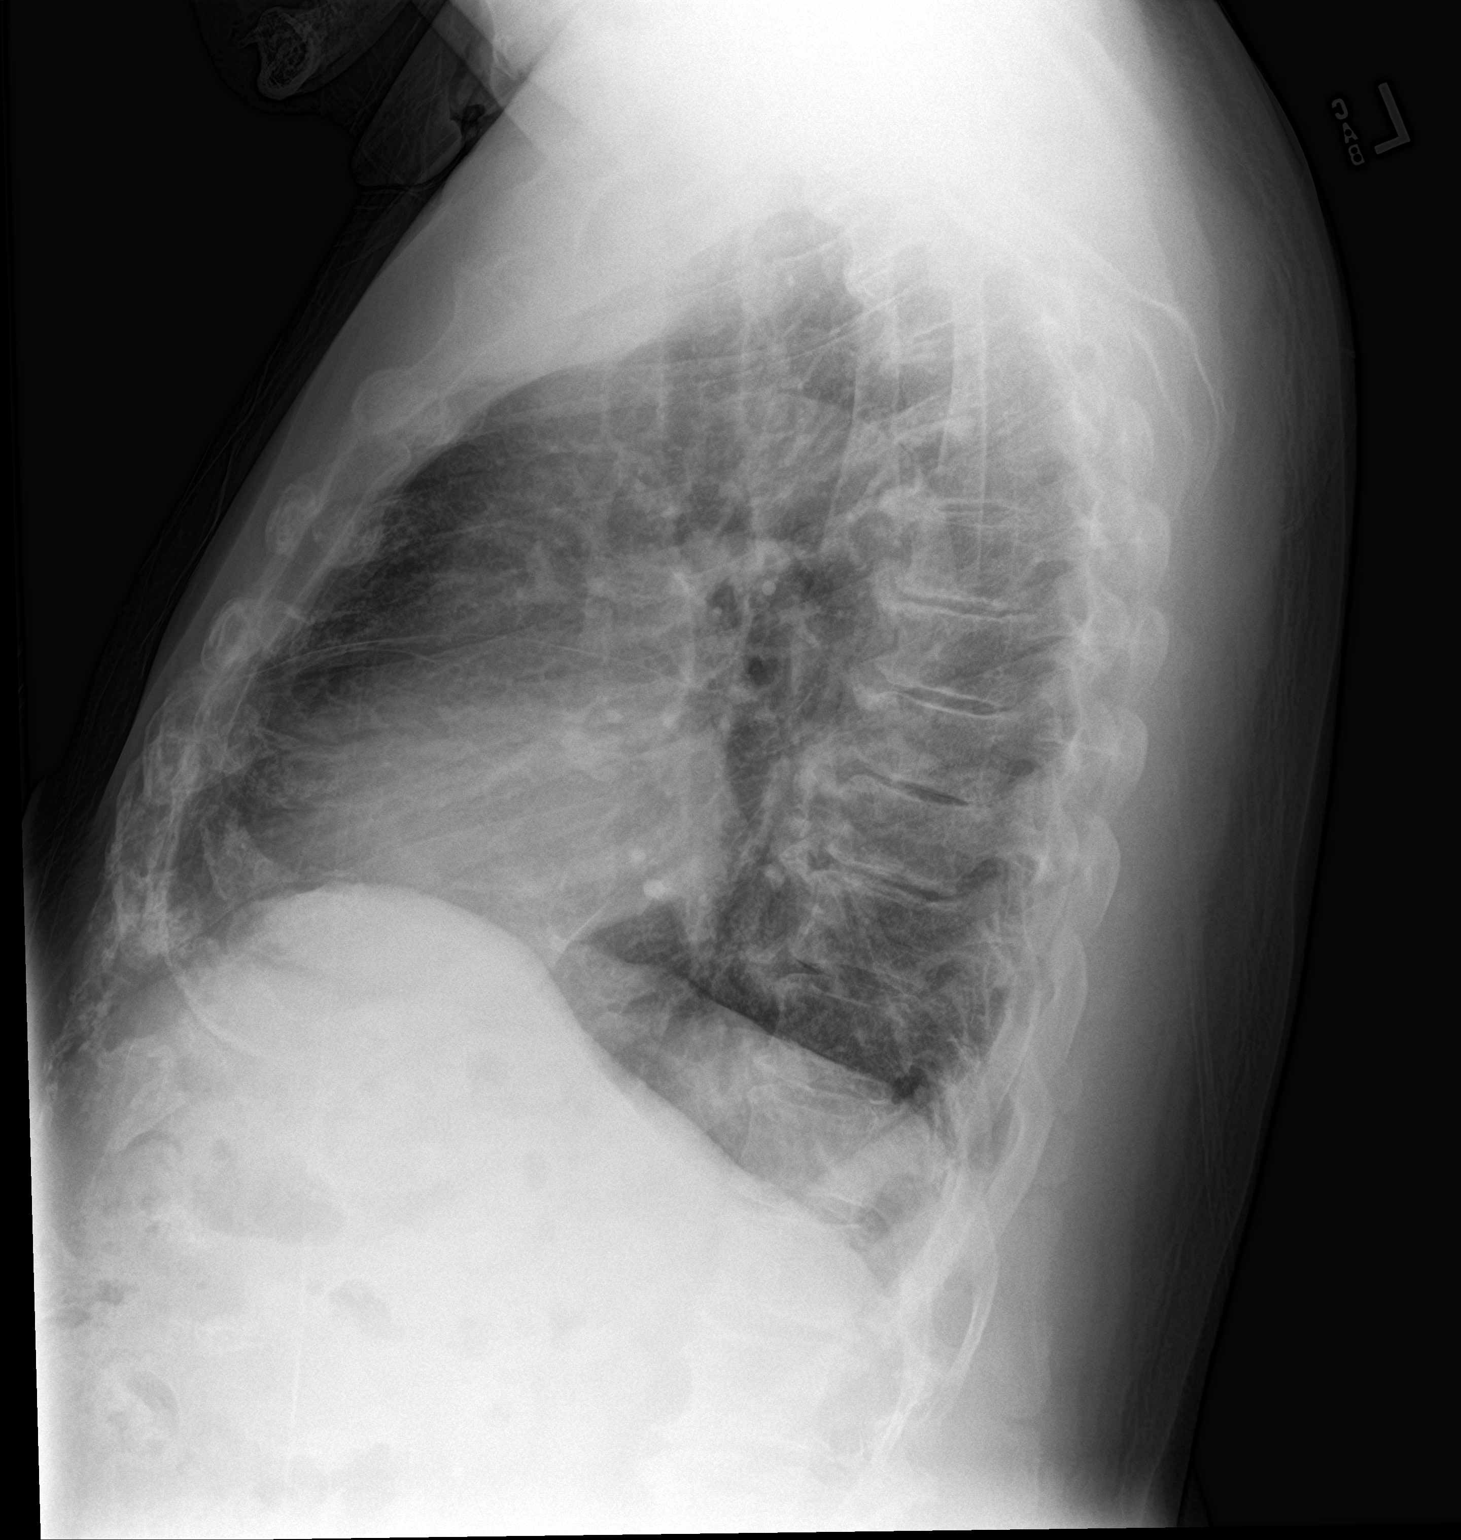

[2 of 2 positions shown; findings below may reference images not displayed]

FINDINGS: Mediastinum hilar structures are normal. Heart size stable. Mild
bilateral interstitial prominence again noted. Although these
changes may be chronic a interstitial process such as pneumonitis
can't be excluded. No pleural effusion or pneumothorax.
IMPRESSION: Mild bilateral interstitial prominence again noted. Although these
changes may be chronic, interstitial process such as pneumonitis
cannot be excluded.

## 2019-04-21 DIAGNOSIS — Z1211 Encounter for screening for malignant neoplasm of colon: Secondary | ICD-10-CM | POA: Diagnosis not present

## 2019-04-21 DIAGNOSIS — Z1212 Encounter for screening for malignant neoplasm of rectum: Secondary | ICD-10-CM | POA: Diagnosis not present

## 2019-05-02 LAB — COLOGUARD: Cologuard: NEGATIVE

## 2019-05-11 ENCOUNTER — Other Ambulatory Visit: Payer: Self-pay | Admitting: Family Medicine

## 2019-05-11 DIAGNOSIS — I10 Essential (primary) hypertension: Secondary | ICD-10-CM

## 2019-05-18 ENCOUNTER — Other Ambulatory Visit: Payer: Self-pay | Admitting: Family Medicine

## 2019-05-18 DIAGNOSIS — J00 Acute nasopharyngitis [common cold]: Secondary | ICD-10-CM

## 2019-06-23 DIAGNOSIS — E119 Type 2 diabetes mellitus without complications: Secondary | ICD-10-CM | POA: Diagnosis not present

## 2019-06-23 DIAGNOSIS — H5203 Hypermetropia, bilateral: Secondary | ICD-10-CM | POA: Diagnosis not present

## 2019-06-23 DIAGNOSIS — Z7984 Long term (current) use of oral hypoglycemic drugs: Secondary | ICD-10-CM | POA: Diagnosis not present

## 2019-06-23 DIAGNOSIS — H2513 Age-related nuclear cataract, bilateral: Secondary | ICD-10-CM | POA: Diagnosis not present

## 2019-06-23 LAB — HM DIABETES EYE EXAM

## 2019-06-27 DIAGNOSIS — H25043 Posterior subcapsular polar age-related cataract, bilateral: Secondary | ICD-10-CM | POA: Diagnosis not present

## 2019-06-27 DIAGNOSIS — H2513 Age-related nuclear cataract, bilateral: Secondary | ICD-10-CM | POA: Diagnosis not present

## 2019-06-27 DIAGNOSIS — H2511 Age-related nuclear cataract, right eye: Secondary | ICD-10-CM | POA: Diagnosis not present

## 2019-06-27 DIAGNOSIS — H25013 Cortical age-related cataract, bilateral: Secondary | ICD-10-CM | POA: Diagnosis not present

## 2019-06-27 DIAGNOSIS — I10 Essential (primary) hypertension: Secondary | ICD-10-CM | POA: Diagnosis not present

## 2019-06-28 ENCOUNTER — Other Ambulatory Visit: Payer: Self-pay | Admitting: Family Medicine

## 2019-07-02 ENCOUNTER — Other Ambulatory Visit: Payer: Self-pay | Admitting: Family Medicine

## 2019-07-02 DIAGNOSIS — I10 Essential (primary) hypertension: Secondary | ICD-10-CM

## 2019-07-10 DIAGNOSIS — H2511 Age-related nuclear cataract, right eye: Secondary | ICD-10-CM | POA: Diagnosis not present

## 2019-07-10 DIAGNOSIS — H25011 Cortical age-related cataract, right eye: Secondary | ICD-10-CM | POA: Diagnosis not present

## 2019-07-11 DIAGNOSIS — H2512 Age-related nuclear cataract, left eye: Secondary | ICD-10-CM | POA: Diagnosis not present

## 2019-07-14 ENCOUNTER — Ambulatory Visit: Payer: BC Managed Care – PPO | Admitting: Family Medicine

## 2019-07-24 ENCOUNTER — Ambulatory Visit: Payer: BC Managed Care – PPO | Admitting: Family Medicine

## 2019-07-31 DIAGNOSIS — H25012 Cortical age-related cataract, left eye: Secondary | ICD-10-CM | POA: Diagnosis not present

## 2019-07-31 DIAGNOSIS — H2512 Age-related nuclear cataract, left eye: Secondary | ICD-10-CM | POA: Diagnosis not present

## 2019-08-06 ENCOUNTER — Encounter: Payer: Self-pay | Admitting: Emergency Medicine

## 2019-08-06 ENCOUNTER — Other Ambulatory Visit: Payer: Self-pay

## 2019-08-06 ENCOUNTER — Emergency Department
Admission: EM | Admit: 2019-08-06 | Discharge: 2019-08-06 | Disposition: A | Discharge status: Home / Self Care | Payer: BC Managed Care – PPO | Attending: Emergency Medicine | Admitting: Emergency Medicine

## 2019-08-06 DIAGNOSIS — H5711 Ocular pain, right eye: Secondary | ICD-10-CM | POA: Diagnosis not present

## 2019-08-06 DIAGNOSIS — I10 Essential (primary) hypertension: Secondary | ICD-10-CM | POA: Insufficient documentation

## 2019-08-06 DIAGNOSIS — H209 Unspecified iridocyclitis: Secondary | ICD-10-CM | POA: Diagnosis not present

## 2019-08-06 DIAGNOSIS — E119 Type 2 diabetes mellitus without complications: Secondary | ICD-10-CM | POA: Insufficient documentation

## 2019-08-06 DIAGNOSIS — Z79899 Other long term (current) drug therapy: Secondary | ICD-10-CM | POA: Insufficient documentation

## 2019-08-06 DIAGNOSIS — Z7984 Long term (current) use of oral hypoglycemic drugs: Secondary | ICD-10-CM | POA: Insufficient documentation

## 2019-08-06 MED ORDER — TETRACAINE HCL 0.5 % OP SOLN
2.0000 [drp] | Freq: Once | OPHTHALMIC | Status: AC
  Administered 2019-08-06: 2 [drp] via OPHTHALMIC
  Filled 2019-08-06: qty 4

## 2019-08-06 MED ORDER — CIPROFLOXACIN HCL 0.3 % OP SOLN
2.0000 [drp] | Freq: Once | OPHTHALMIC | Status: AC
  Administered 2019-08-06: 2 [drp] via OPHTHALMIC
  Filled 2019-08-06 (×2): qty 2.5

## 2019-08-06 MED ORDER — CIPROFLOXACIN HCL 0.3 % OP SOLN: 1.0000 [drp] | OPHTHALMIC | 0 refills | Status: AC

## 2019-08-06 NOTE — ED Provider Notes (Signed)
[facility] Emergency Department Provider Note ____________________   First MD Initiated Contact with Patient 08/06/19 [phone]     (approximate)  I have reviewed the triage vital signs and the nursing notes.   HISTORY  Chief Complaint Eye Pain   HPI Brady Sanchez is a 73 y.o. male with below list of previous medical conditions occluding cataract removal out from the right eye 3 weeks ago presents to the emergency department secondary to nontraumatic right eye pain with redness which the patient states began on Sunday.  Patient denies any decreased vision or blurry vision from the eye.  Patient denies any watering or tearing.  Patient denies any discharge from the eye.  Patient admits to photophobia.  Patient denies any foreign body sensation.  Patient states current pain score is 5 out of 10       Past Medical History:  Diagnosis Date  . Hyperlipidemia   . Hypertension     Patient Active Problem List   Diagnosis Date Noted  . Abnormal finding on diagnostic imaging of left kidney 11/21/2018  . Hypertension 12/28/2017  . Hyperlipidemia 12/28/2017  . Type 2 diabetes mellitus with other specified complication ([place]) [id number]  . 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 CATARACT EXTRACTION    . REPLACEMENT TOTAL KNEE      Prior to Admission medications   Medication Sig Start Date End Date Taking? Authorizing Provider  Albuterol Sulfate (PROAIR RESPICLICK) [id number] (90 Base) MCG/ACT AEPB Inhale 2 puffs into the lungs every 4 (four) hours as needed. 11/27/18   Karamalegos, Devonne Doughty, DO  atorvastatin (LIPITOR) 20 MG tablet Take 1 tablet (20 mg total) by mouth daily. 11/21/18   Karamalegos, Devonne Doughty, DO  ciprofloxacin (CILOXAN) 0.3 % ophthalmic solution Place 1 drop into the right eye every 2 (two) hours for 5 days. Administer 1 drop, every 2 hours, while awake, for 2 days. Then 1 drop, every 4  hours, while awake, for the next 5 days. 08/06/19 08/11/19  Gregor Hams, MD  fluticasone (FLONASE) 50 MCG/ACT nasal spray PLACE 2 SPRAYS INTO BOTH NOSTRILS DAILY. USE FOR 4-6 WEEKS THEN STOP AND USE SEASONALLY OR AS NEEDED. 05/19/19   Parks Ranger, Devonne Doughty, DO  hydrochlorothiazide (HYDRODIURIL) 25 MG tablet Take 1 tablet (25 mg total) by mouth daily. 04/10/19   Karamalegos, Devonne Doughty, DO  lisinopril (ZESTRIL) 20 MG tablet TAKE 1 TABLET BY MOUTH EVERY DAY 05/13/19   Parks Ranger, Devonne Doughty, DO  metFORMIN (GLUCOPHAGE) 1000 MG tablet Take 1 tablet (1,000 mg total) by mouth 2 (two) times daily with a meal. 04/10/19   Karamalegos, Devonne Doughty, DO  metoprolol succinate (TOPROL-XL) 50 MG 24 hr tablet TAKE 1 TABLET (50 MG TOTAL) BY MOUTH DAILY. FOR HIGH BLOOD PRESSURE 07/02/19   Parks Ranger, Devonne Doughty, DO  triamcinolone cream (KENALOG) 0.5 % APPLY TO AFFECTED AREA EVERY DAY 06/30/19   Olin Hauser, DO    Allergies Dm-doxylamine-acetaminophen  Family History  Problem Relation Age of Onset  . Seizures Mother   . Cancer Father        lung    Social History Social History   Tobacco Use  . Smoking status: Never Smoker  . Smokeless tobacco: Never Used  Substance Use Topics  . Alcohol use: Yes  . Drug use: No    Review of Systems Constitutional: No fever/chills Eyes: No visual changes.  Positive for right eye redness pain. ENT: No  sore throat. Cardiovascular: Denies chest pain. Respiratory: Denies shortness of breath. Gastrointestinal: No abdominal pain.  No nausea, no vomiting.  No diarrhea.  No constipation. Genitourinary: Negative for dysuria. Musculoskeletal: Negative for neck pain.  Negative for back pain. Integumentary: Negative for rash. Neurological: Negative for headaches, focal weakness or numbness.  ____________________________________________   PHYSICAL EXAM:  VITAL SIGNS: ED Triage Vitals [08/06/19 0125]  Enc Vitals Group     BP (!) 202/78     Pulse  Rate 63     Resp 18     Temp 98.1 F (36.7 C)     Temp Source Oral     SpO2 99 %     Weight 108.9 kg (240 lb)     Height 1.753 m (5\' 9" )     Head Circumference      Peak Flow      Pain Score 5     Pain Loc      Pain Edu?      Excl. in [place]?     Constitutional: Alert and oriented.  Eyes: Conjunctivae are normal.  No discharge noted.  Erythema noted around the iris.  Pupil is not fixed  Head: Atraumatic. Mouth/Throat: Mucous membranes are moist. Neck: No stridor.  No meningeal signs.   Cardiovascular: Normal rate, regular rhythm. Good peripheral circulation. Grossly normal heart sounds. Respiratory: Normal respiratory effort.  No retractions. Neurologic:  Normal speech and language. No gross focal neurologic deficits are appreciated.  Skin:  Skin is warm, dry and intact. Psychiatric: Mood and affect are normal. Speech and behavior are normal.    Procedures   ____________________________________________   INITIAL IMPRESSION / MDM / ASSESSMENT AND PLAN / ED COURSE  As part of my medical decision making, I reviewed the following data within the electronic MEDICAL RECORD NUMBER   73 year old male presenting with above-stated history and physical exam consistent with acute anterior uveitis with concern for possible bacterial etiology given recent cataract surgery.  Cipro ophthalmic introduced into the patient's eye.  Patient has an appointment with ophthalmology tomorrow however I explained to him the necessity of following up with them today.  ____________________________________________  FINAL CLINICAL IMPRESSION(S) / ED DIAGNOSES  Final diagnoses:  Uveitis, anterior     MEDICATIONS GIVEN DURING THIS VISIT:  Medications  tetracaine (PONTOCAINE) 0.5 % ophthalmic solution 2 drop (2 drops Right Eye Given 08/06/19 0409)  ciprofloxacin (CILOXAN) 0.3 % ophthalmic solution 2 drop (2 drops Right Eye Given 08/06/19 0452)     ED Discharge Orders         Ordered    ciprofloxacin  (CILOXAN) 0.3 % ophthalmic solution  Every 2 hours     08/06/19 0442          *Please note:  SHAWAN FAISON was evaluated in Emergency Department on 08/06/2019 for the symptoms described in the history of present illness. He was evaluated in the context of the global COVID-19 pandemic, which necessitated consideration that the patient might be at risk for infection with the SARS-CoV-2 virus that causes COVID-19. Institutional protocols and algorithms that pertain to the evaluation of patients at risk for COVID-19 are in a state of rapid change based on information released by regulatory bodies including the CDC and federal and state organizations. These policies and algorithms were followed during the patient's care in the ED.  Some ED evaluations and interventions may be delayed as a result of limited staffing during the pandemic.*  Note:  This document was prepared using Dragon voice recognition  software and may include unintentional dictation errors.   Gregor Hams, MD 08/06/19 [phone]

## 2019-08-06 NOTE — ED Triage Notes (Addendum)
Patient ambulatory to triage with steady gait, without difficulty or distress noted, mask in place; pt reports rt eye pain since Sunday; cataract removal 3wks ago; denies any injury; visual acuity rt eye 20/40 and left 20/30

## 2019-08-07 ENCOUNTER — Ambulatory Visit: Payer: BC Managed Care – PPO | Admitting: Family Medicine

## 2019-08-19 ENCOUNTER — Ambulatory Visit (INDEPENDENT_AMBULATORY_CARE_PROVIDER_SITE_OTHER): Payer: BC Managed Care – PPO | Admitting: Family Medicine

## 2019-08-19 ENCOUNTER — Encounter: Payer: Self-pay | Admitting: Family Medicine

## 2019-08-19 ENCOUNTER — Other Ambulatory Visit: Payer: Self-pay

## 2019-08-19 VITALS — BP 138/70 | HR 58 | Temp 98.5°F | Resp 18 | Ht 69.0 in | Wt 227.0 lb

## 2019-08-19 DIAGNOSIS — M79641 Pain in right hand: Secondary | ICD-10-CM | POA: Diagnosis not present

## 2019-08-19 DIAGNOSIS — Z23 Encounter for immunization: Secondary | ICD-10-CM

## 2019-08-19 DIAGNOSIS — E1169 Type 2 diabetes mellitus with other specified complication: Secondary | ICD-10-CM

## 2019-08-19 DIAGNOSIS — I10 Essential (primary) hypertension: Secondary | ICD-10-CM

## 2019-08-19 DIAGNOSIS — D492 Neoplasm of unspecified behavior of bone, soft tissue, and skin: Secondary | ICD-10-CM

## 2019-08-19 DIAGNOSIS — Z9842 Cataract extraction status, left eye: Secondary | ICD-10-CM

## 2019-08-19 DIAGNOSIS — Z9841 Cataract extraction status, right eye: Secondary | ICD-10-CM

## 2019-08-19 DIAGNOSIS — G8929 Other chronic pain: Secondary | ICD-10-CM

## 2019-08-19 LAB — POCT GLYCOSYLATED HEMOGLOBIN (HGB A1C): Hemoglobin A1C: 7 % — AB (ref 4.0–5.6)

## 2019-08-19 NOTE — Progress Notes (Signed)
Subjective:    Patient ID: Brady Sanchez, male    DOB: [birth year], 73 y.o.   MRN: [id number]  Brady Sanchez is a 73 y.o. male presenting on 08/19/2019 for Hypertension and Diabetes   HPI   CHRONIC DM, Type 2/ Obesity BMI >33 No new concerns. Not checking sugar regularly. Last A1c 7.3 CBGs:none recently Meds:Metformin 1000mg  BID Reports good compliance. Tolerating well w/o side-effects Currently on ACEi Lifestyle: - Diet (low sugar low carb diet, trying to improve)  - Exercise (limited recently) S/p DM Eye Exam Dr Matilde Sprang 06/23/19. He is s/p cataract removal surgery 9/17 and 9/27 with improved vision able to read more, less blurry. Denies hypoglycemia, polyuria, visual changes, numbness or tingling.  CHRONIC HTN: Reports not checking BP regularly he does not have a cuff. Office BP readings have been elevated but now seems improved. He is asymptomatic. Last visit dose HCTZ 12.5 up to 25mg  increased. Current Meds - Lisinopril 20mg  daily, HCTZ 25mg  daily, Metoprolol XL 50mg  daily   Reports good compliance, took meds today. Tolerating well, w/o complaints. Denies CP, dyspnea, edema, dizziness / lightheadedness, RESOLVED headaches  Right Hand Dorsal Growth vs Lipoma History in past issue similar on Left hand dorsal with growth or issue that impeded his function of extensor tendons. He saw ortho in [place] at [facility] they removed the problem and fixed it. Many years ago he does not recall who did this. He would like referral back to Orthopedics.  Health Maintenance: Due for Flu Shot, will receive today   Last Cologuard done 04/2019 negative good for 3 years.   Depression screen [facility] 2/9 08/19/2019 04/10/2019 01/02/2019  Decreased Interest 0 0 0  Down, Depressed, Hopeless 0 0 0  PHQ - 2 Score 0 0 0  Altered sleeping 0 - -  Tired, decreased energy 0 - -  Change in appetite 0 - -  Feeling bad or failure about yourself  0 - -  Trouble concentrating 0 - -  Moving slowly or  fidgety/restless 0 - -  Suicidal thoughts 0 - -  PHQ-9 Score 0 - -    Social History   Tobacco Use  . Smoking status: Never Smoker  . Smokeless tobacco: Never Used  Substance Use Topics  . Alcohol use: Yes  . Drug use: No    Review of Systems Per HPI unless specifically indicated above     Objective:    BP 138/70 (BP Location: Left Arm, Cuff Size: Normal)   Pulse (!) 58   Temp 98.5 F (36.9 C) (Oral)   Resp 18   Ht 5\' 9"  (1.753 m)   Wt 227 lb (103 kg)   SpO2 100%   BMI 33.52 kg/m   Wt Readings from Last 3 Encounters:  08/19/19 227 lb (103 kg)  08/06/19 240 lb (108.9 kg)  04/10/19 229 lb (103.9 kg)    Physical Exam Vitals signs and nursing note reviewed.  Constitutional:      General: He is not in acute distress.    Appearance: He is well-developed. He is not diaphoretic.     Comments: Well-appearing, comfortable, cooperative  HENT:     Head: Normocephalic and atraumatic.  Eyes:     General:        Right eye: No discharge.        Left eye: No discharge.     Conjunctiva/sclera: Conjunctivae normal.  Cardiovascular:     Rate and Rhythm: Normal rate.  Pulmonary:     Effort: Pulmonary  effort is normal.  Musculoskeletal: Normal range of motion.     Comments: Right hand, dorsal with soft tissue mild growth over mid hand extensor tendons, mobile, moves with tendons, slightly tender, no redness or other deformity, has scar from prior incision on left dorsal hand from similar issue in past. Normal range of motion R hand grip and ext flex  Skin:    General: Skin is warm and dry.     Findings: No erythema or rash.  Neurological:     Mental Status: He is alert and oriented to person, place, and time.  Psychiatric:        Behavior: Behavior normal.     Comments: Well groomed, good eye contact, normal speech and thoughts        Results for orders placed or performed in visit on 08/19/19  POCT glycosylated hemoglobin (Hb A1C)  Result Value Ref Range   Hemoglobin  A1C 7.0 (A) 4.0 - 5.6 %   Recent Labs    01/02/19 1114 04/03/19 0808 08/19/19 1000  HGBA1C 6.6* 7.3* 7.0*       Assessment & Plan:   Problem List Items Addressed This Visit    Hypertension    Improved controlled HTN on current regimen - Home BP readings none available  No known complications   Plan:  1. Continue current BP regimen - HCTZ 25mg  daily, Metoprolol XL 50mg  daily, Lisinopril 20mg  daily 2. Encourage improved lifestyle - emphasized importance of low sodium diet, regular exercise 3. Start monitor BP outside office, bring readings to next visit, if persistently >140/90 or new symptoms notify office soone      S/P bilateral cataract extraction   Type 2 diabetes mellitus with other specified complication ([place]) - Primary    Improved A1c to 7.0 No hypoglycemia or hyperglycemia Complications - hyperlipidemia, obesity, hypothyroidism, - increases risk of future cardiovascular complications   Plan:  1. Continue current therapy - Metformin 1000mg  BID 2. Encourage improved lifestyle - low carb, low sugar diet, reduce portion size, continue improving regular exercise 3. Check CBG , bring log to next visit for review 4. Continue ACEi, Statin 5. Follow-up 4 months      Relevant Orders   POCT glycosylated hemoglobin (Hb A1C) (Completed)    Other Visit Diagnoses    Needs flu shot       Relevant Orders   Flu Vaccine QUAD High Dose(Fluad) (Completed)   Chronic hand pain, right       Relevant Orders   Ambulatory referral to Orthopedic Surgery   Neoplasm of soft tissue of hand       Relevant Orders   Ambulatory referral to Orthopedic Surgery      Referral to [facility] locally for Dr Verita Lamb for RIGHT dorsal hand with growth vs lipoma interfering with tendon sheath limiting his motion and causing pain, he had similar issue on Left hand removed >10 years ago - may warrant surgical intervention     No orders of the defined types were  placed in this encounter.  Orders Placed This Encounter  Procedures  . Flu Vaccine QUAD High Dose(Fluad)  . Ambulatory referral to Orthopedic Surgery    Referral Priority:   Routine    Referral Type:   Surgical    Referral Reason:   Specialty Services Required    Referred to Provider:   Rutherford Limerick, MD    Requested Specialty:   Orthopedic Surgery    Number of Visits Requested:   1  .  POCT glycosylated hemoglobin (Hb A1C)     Follow up plan: Return in about 4 months (around 12/20/2019) for 4 month DM A1c, HTN, hand f/u.   Nobie Putnam, DO [facility] 08/19/2019, 9:53 AM

## 2019-08-19 NOTE — Assessment & Plan Note (Signed)
Improved A1c to 7.0 No hypoglycemia or hyperglycemia Complications - hyperlipidemia, obesity, hypothyroidism, - increases risk of future cardiovascular complications   Plan:  1. Continue current therapy - Metformin 1000mg  BID 2. Encourage improved lifestyle - low carb, low sugar diet, reduce portion size, continue improving regular exercise 3. Check CBG , bring log to next visit for review 4. Continue ACEi, Statin 5. Follow-up 4 months

## 2019-08-19 NOTE — Patient Instructions (Addendum)
Thank you for coming to the office today.  Referral to [facility] for Hand  [facility] (formerly [facility] Address: [address], [place], [place] [address] Hours:  9AM-5PM Phone: [phone]  -----  Recent Labs    01/02/19 1114 04/03/19 0808 08/19/19 1000  HGBA1C 6.6* 7.3* 7.0*    Improved sugar! Keep up good work  [employer] today  Keep improving lifestyle.  Please schedule a Follow-up Appointment to: Return in about 4 months (around 12/20/2019) for 4 month DM A1c, HTN, hand f/u.  If you have any other questions or concerns, please feel free to call the office or send a message through [place]. You may also schedule an earlier appointment if necessary.  Additionally, you may be receiving a survey about your experience at our office within a few days to 1 week by e-mail or mail. We value your feedback.  Nobie Putnam, DO [place]

## 2019-08-19 NOTE — Assessment & Plan Note (Signed)
Improved controlled HTN on current regimen - Home BP readings none available  No known complications   Plan:  1. Continue current BP regimen - HCTZ 25mg  daily, Metoprolol XL 50mg  daily, Lisinopril 20mg  daily 2. Encourage improved lifestyle - emphasized importance of low sodium diet, regular exercise 3. Start monitor BP outside office, bring readings to next visit, if persistently >140/90 or new symptoms notify office soone

## 2019-08-25 IMAGING — CT CT CHEST W/O CM
2 of 3 series · 15 of 36 positions shown, 18 images · non-contrast
Comparison: Chest x-ray dated 11/13/2018

CLINICAL DATA: Pneumonia. Shortness of breath.

EXAM:
CT CHEST WITHOUT CONTRAST
TECHNIQUE: Multidetector CT imaging of the chest was performed following the
standard protocol without IV contrast.

[Series 2: thorax · axial · 0.66mm/px · z∈[-303,-65]mm · 12 of 141 slices shown, 15 images]
[im 11/141  mediastinal]
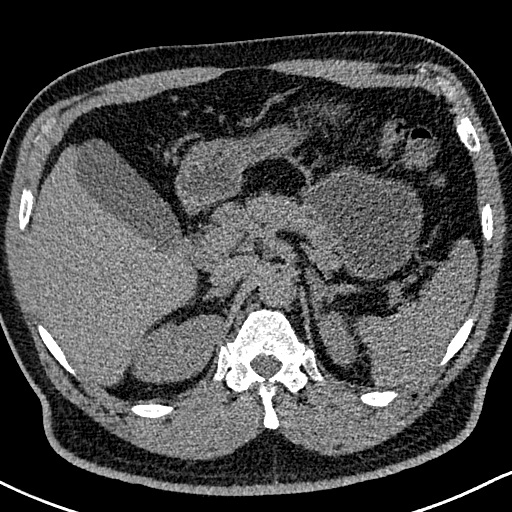
[im 11/141  lung]
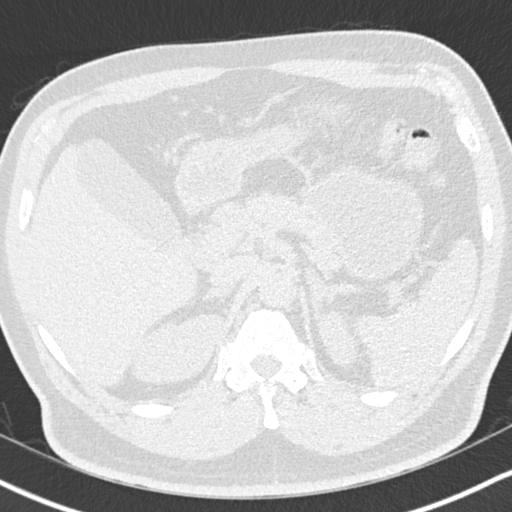
[im 21/141  lung]
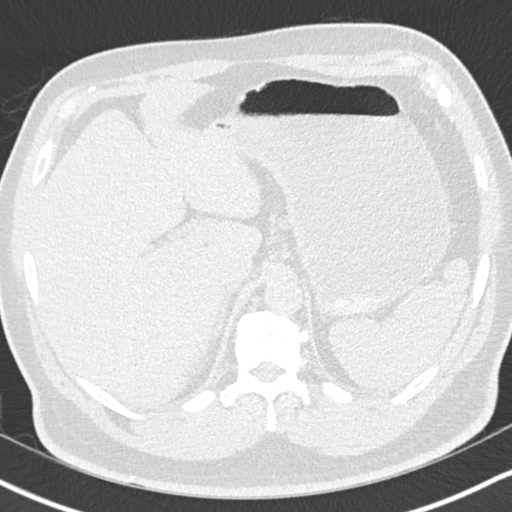
[im 32/141  lung]
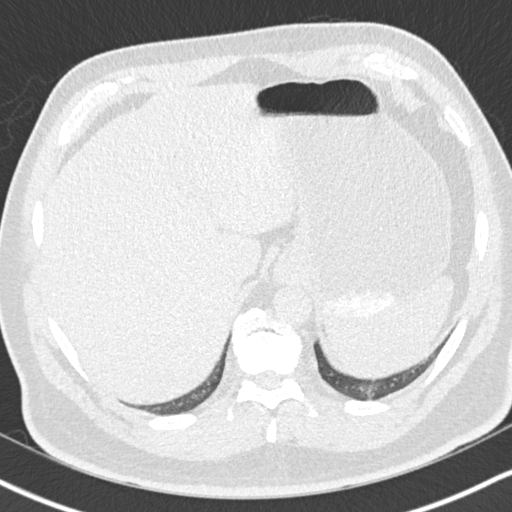
[im 42/141  lung]
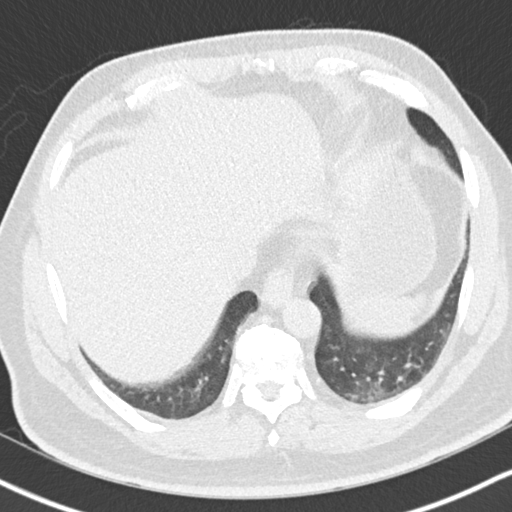
[im 52/141  mediastinal]
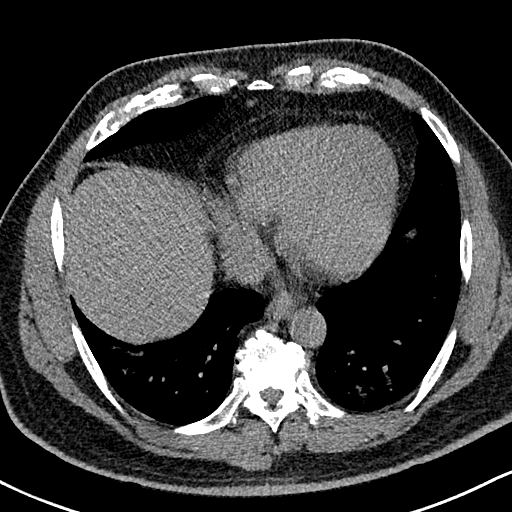
[im 52/141  lung]
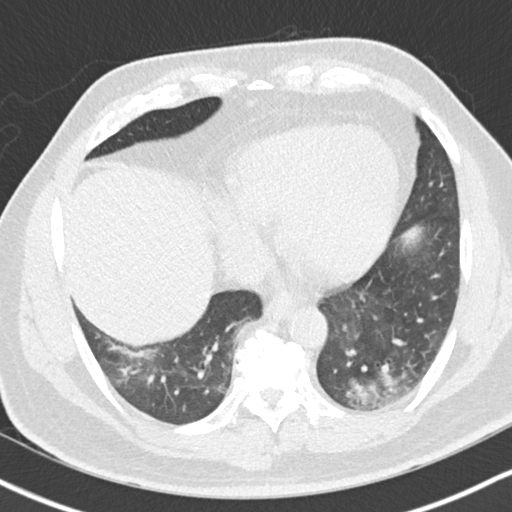
[im 63/141  lung]
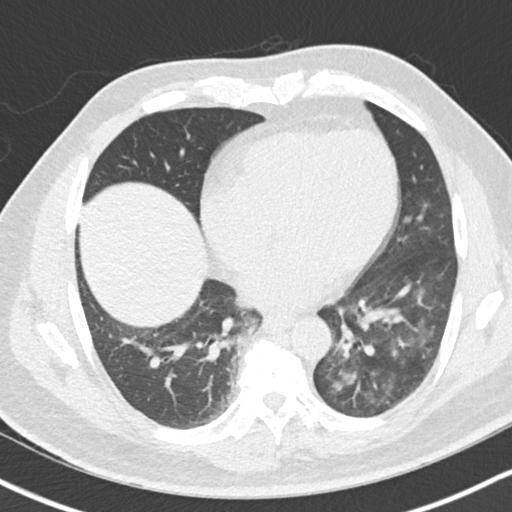
[im 78/141  lung]
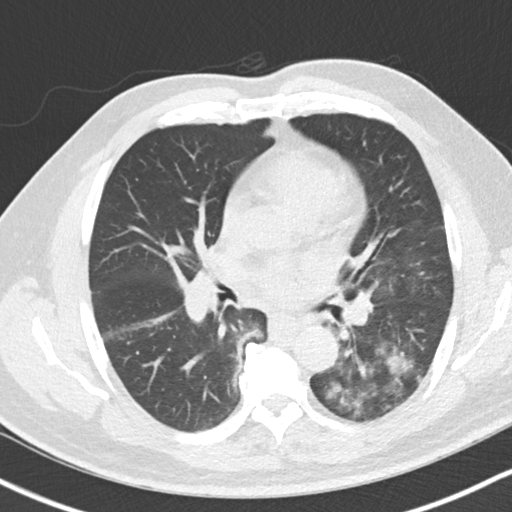
[im 89/141  lung]
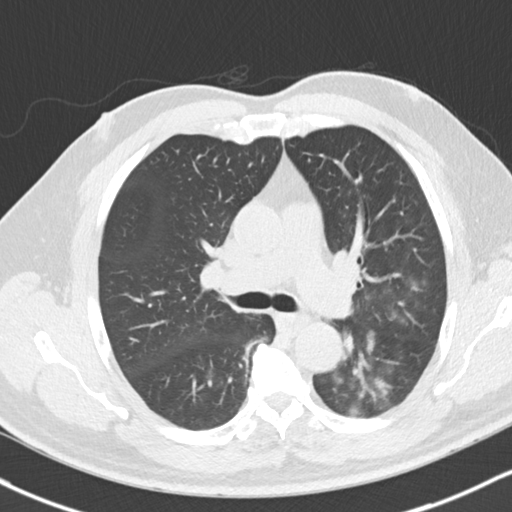
[im 99/141  mediastinal]
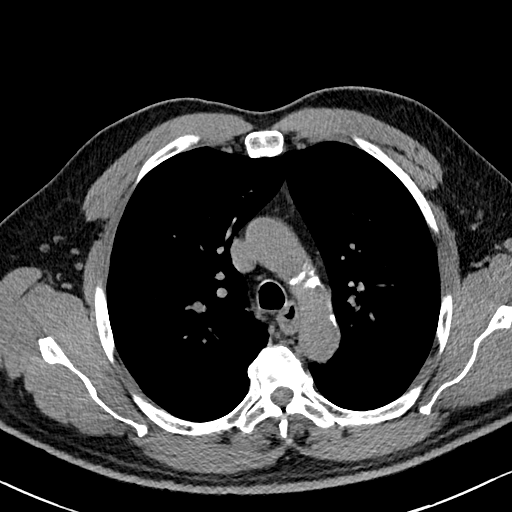
[im 99/141  lung]
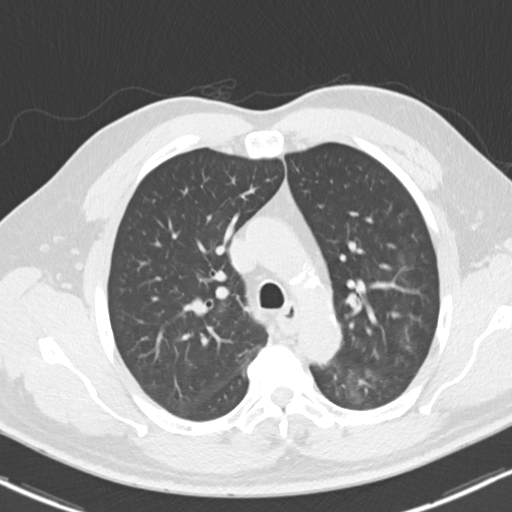
[im 109/141  lung]
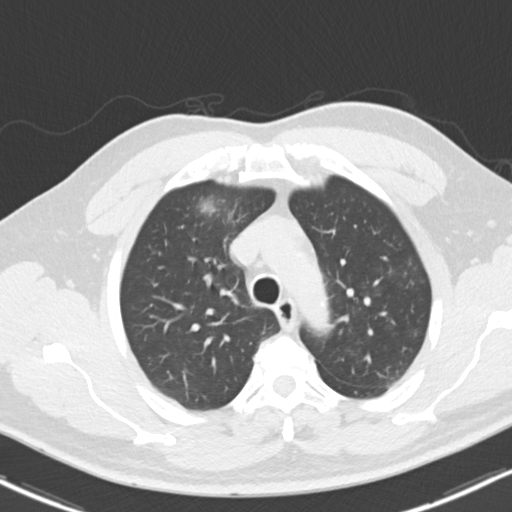
[im 120/141  lung]
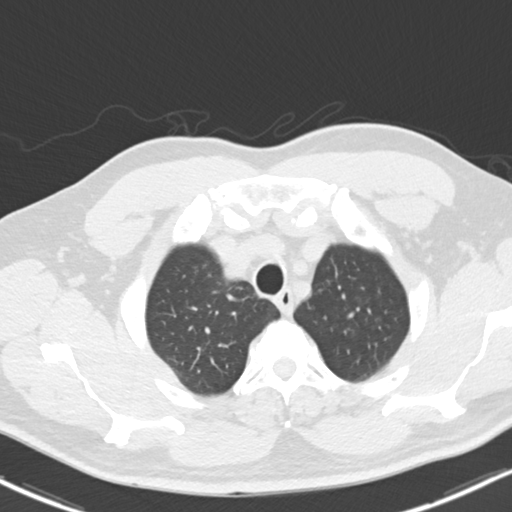
[im 130/141  lung]
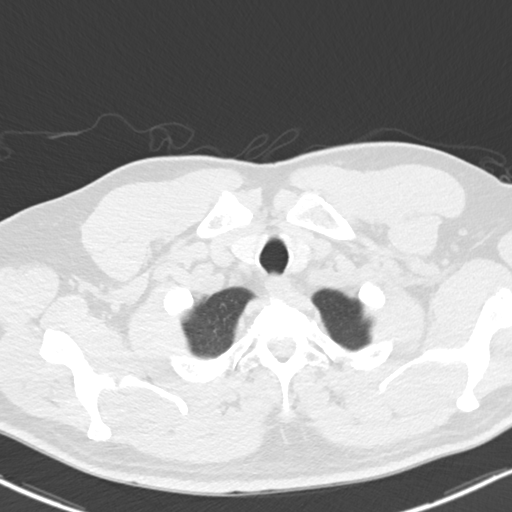

[Series 5: coronal · coronal · 0.59mm/px · 3 of 152 slices shown]
[im 31/152  lung]
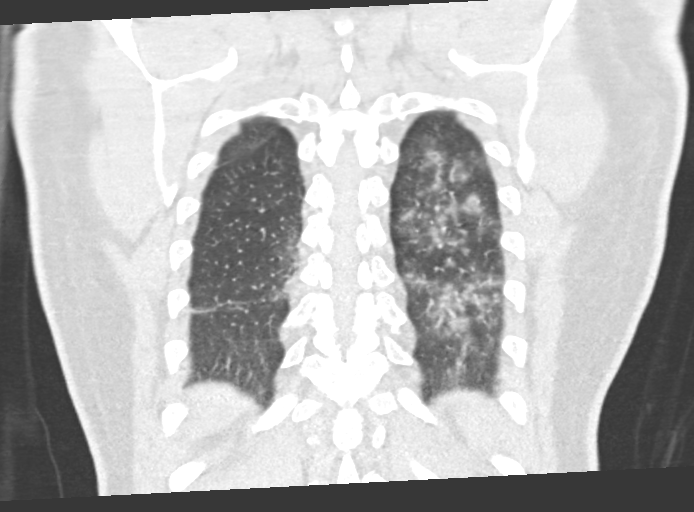
[im 61/152  lung]
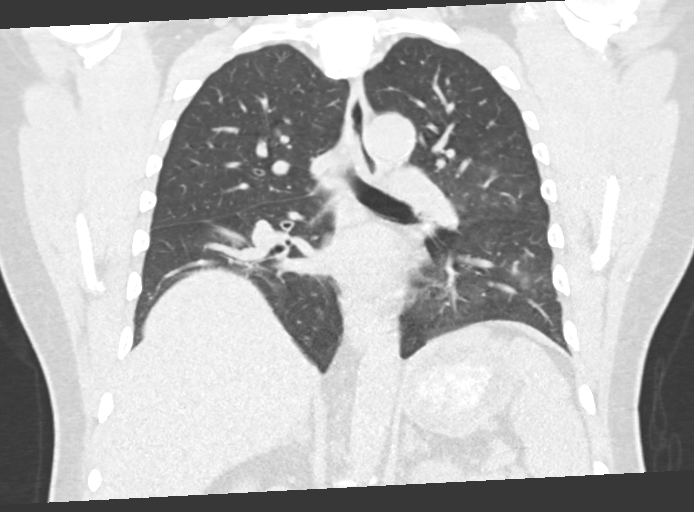
[im 91/152  lung]
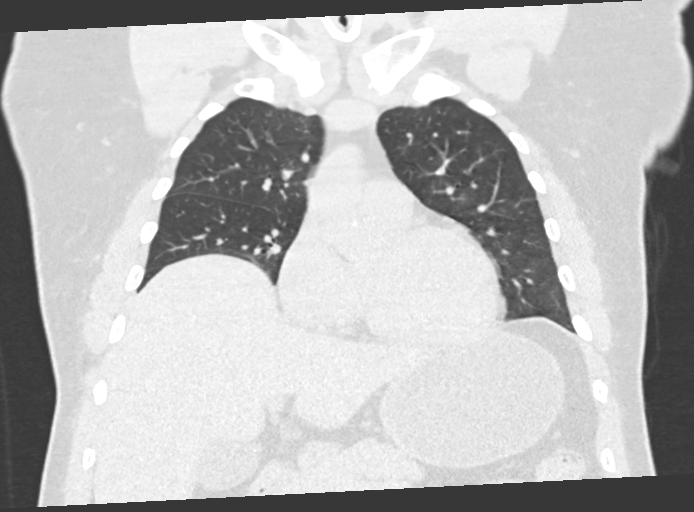

[15 of 36 positions shown; findings below may reference images not displayed]

FINDINGS: Cardiovascular: No significant vascular findings except aortic
atherosclerosis. Normal heart size. No pericardial effusion.

Mediastinum/Nodes: No enlarged mediastinal or axillary lymph nodes.
Thyroid gland, trachea, and esophagus demonstrate no significant
findings.

Lungs/Pleura: There is an extensive patchy infiltrate in the left
lower lobe with faint areas of infiltrate in the left upper lobe.
There is a single tiny area of density in the right lower lobe as
well as minimal right base atelectasis. No effusions.

Upper Abdomen: 17 mm area of vague low-density in the upper pole the
left kidney, incompletely visualized. This is indeterminate.
Otherwise benign appearing upper abdomen.

Musculoskeletal: No chest wall mass or suspicious bone lesions
identified.
IMPRESSION: 1. Extensive patchy infiltrate in the left lower lobe with faint
areas of infiltrate in the left upper lobe and right lower lobe.
2. Indeterminate 17 mm area of low-density in the upper pole of the
left kidney. Renal ultrasound recommended for further evaluation.

Aortic Atherosclerosis ([id number]).

## 2019-09-15 IMAGING — DX DG CHEST 2V
2 series · 2 of 2 positions shown · non-contrast
Comparison: CT chest dated 11/13/2018

CLINICAL DATA: Follow-up pneumonia, cough

EXAM:
CHEST - 2 VIEW

[chest pa]
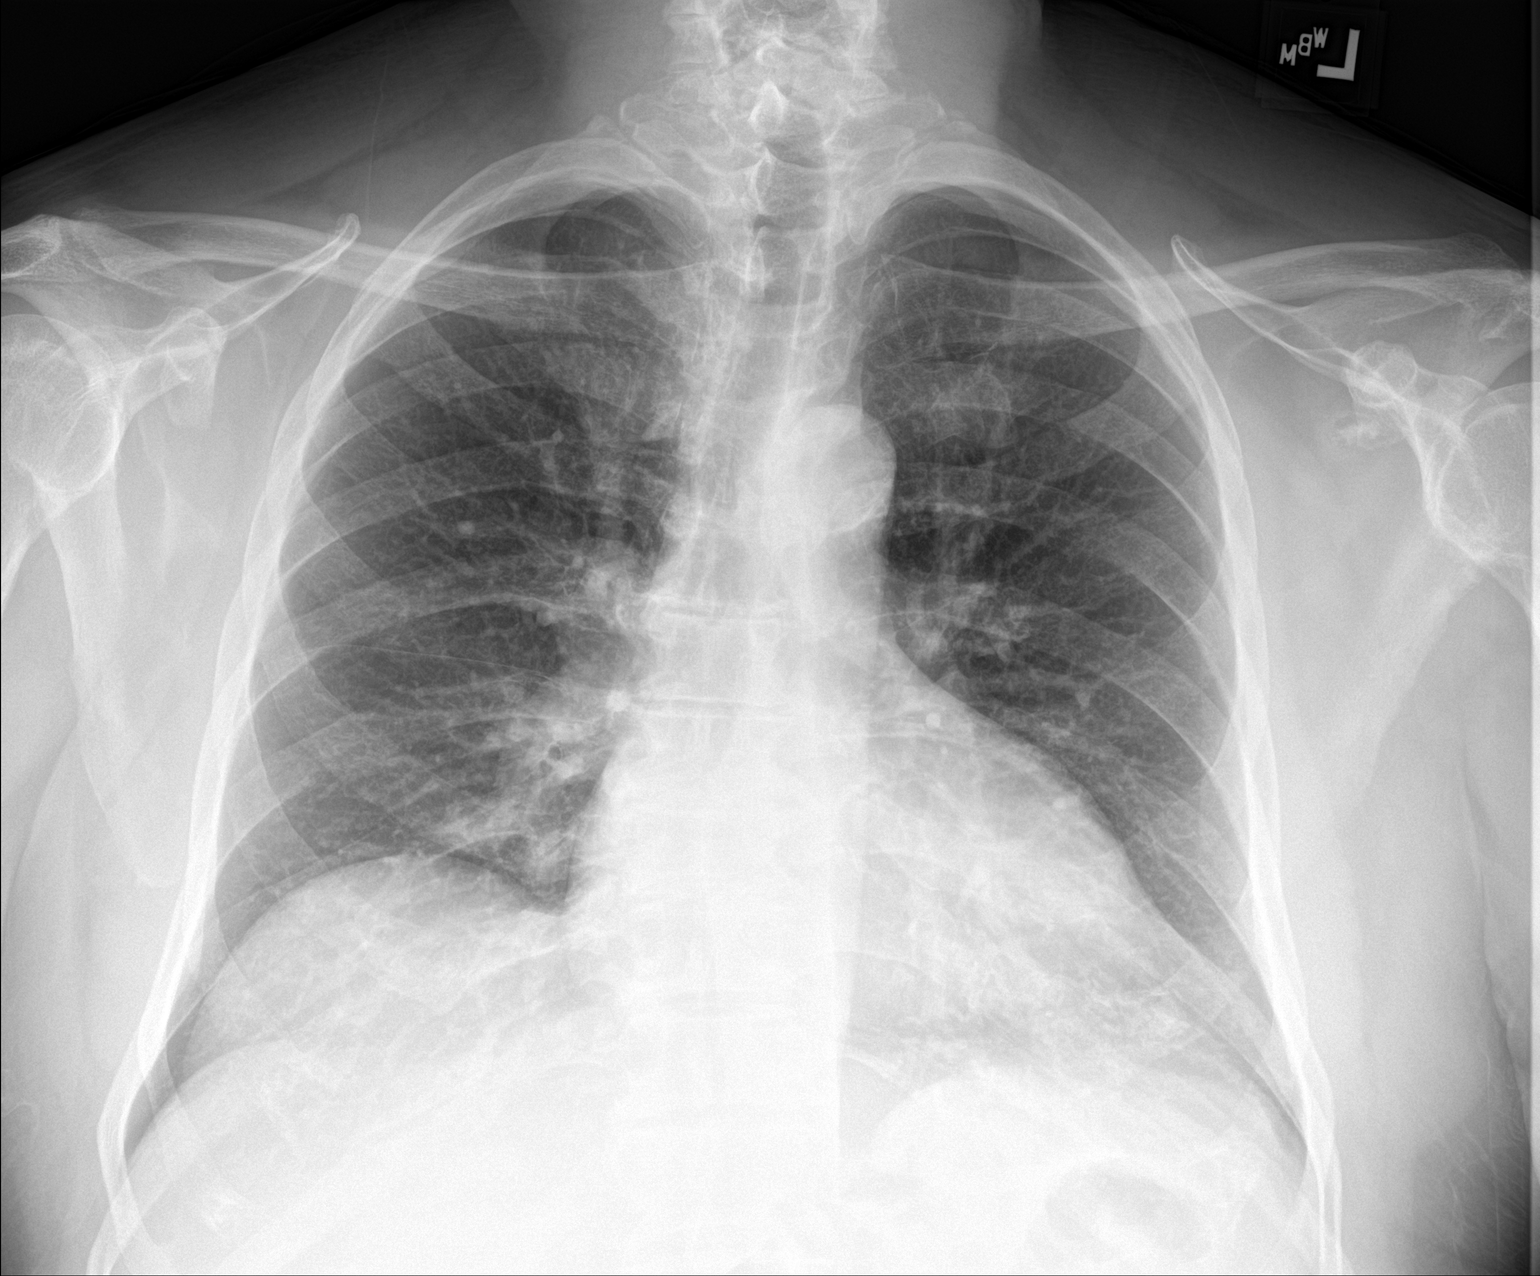

[chest lat]
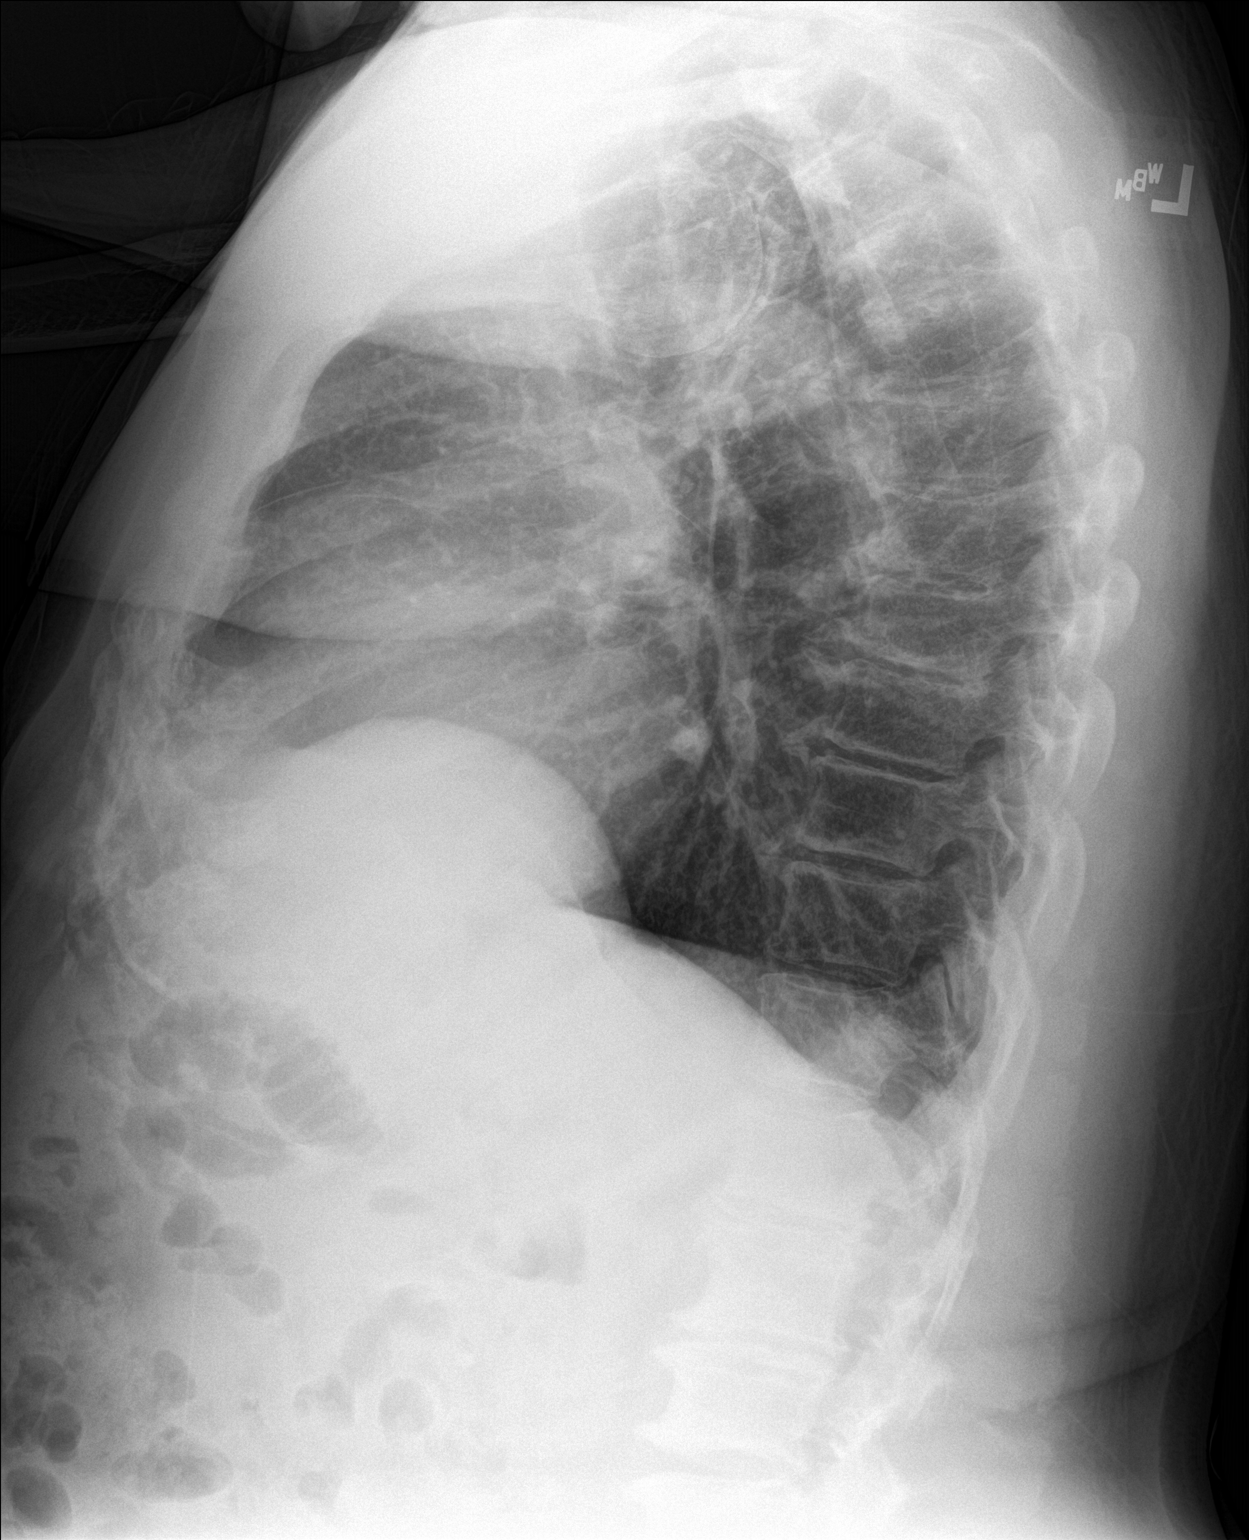

[2 of 2 positions shown; findings below may reference images not displayed]

FINDINGS: Mild left basilar opacity. Right lung is clear. No pleural effusion
or pneumothorax.

The heart is normal in size.

Degenerative changes of the visualized thoracolumbar spine.
IMPRESSION: Mild left basilar opacity, likely reflecting residual
infection/pneumonia when correlating with prior CT.

## 2019-10-13 ENCOUNTER — Ambulatory Visit: Payer: BLUE CROSS/BLUE SHIELD | Admitting: Family Medicine

## 2019-10-13 DIAGNOSIS — M25462 Effusion, left knee: Secondary | ICD-10-CM | POA: Diagnosis not present

## 2019-10-13 DIAGNOSIS — M1711 Unilateral primary osteoarthritis, right knee: Secondary | ICD-10-CM | POA: Diagnosis not present

## 2019-10-13 DIAGNOSIS — Z96651 Presence of right artificial knee joint: Secondary | ICD-10-CM | POA: Diagnosis not present

## 2019-10-13 DIAGNOSIS — M25461 Effusion, right knee: Secondary | ICD-10-CM | POA: Diagnosis not present

## 2019-10-21 ENCOUNTER — Other Ambulatory Visit: Payer: Self-pay | Admitting: Family Medicine

## 2019-10-21 DIAGNOSIS — I10 Essential (primary) hypertension: Secondary | ICD-10-CM

## 2019-10-21 MED ORDER — HYDROCHLOROTHIAZIDE 25 MG PO TABS: 25.0000 mg | ORAL_TABLET | Freq: Every day | ORAL | 1 refills | Status: DC

## 2019-10-21 NOTE — Addendum Note (Signed)
Addended by: Olin Hauser on: 10/21/2019 05:38 PM   Modules accepted: Orders

## 2019-10-22 DIAGNOSIS — M25531 Pain in right wrist: Secondary | ICD-10-CM | POA: Diagnosis not present

## 2019-10-22 DIAGNOSIS — M67441 Ganglion, right hand: Secondary | ICD-10-CM | POA: Diagnosis not present

## 2019-11-10 ENCOUNTER — Other Ambulatory Visit: Payer: Self-pay | Admitting: Family Medicine

## 2019-11-10 DIAGNOSIS — J189 Pneumonia, unspecified organism: Secondary | ICD-10-CM

## 2019-11-10 DIAGNOSIS — E785 Hyperlipidemia, unspecified: Secondary | ICD-10-CM

## 2019-12-19 ENCOUNTER — Encounter: Payer: Self-pay | Admitting: Family Medicine

## 2019-12-19 ENCOUNTER — Ambulatory Visit (INDEPENDENT_AMBULATORY_CARE_PROVIDER_SITE_OTHER): Payer: BC Managed Care – PPO | Admitting: Family Medicine

## 2019-12-19 ENCOUNTER — Other Ambulatory Visit: Payer: Self-pay

## 2019-12-19 VITALS — BP 147/65 | HR 62 | Temp 97.7°F | Resp 16 | Ht 69.0 in | Wt 228.0 lb

## 2019-12-19 DIAGNOSIS — I10 Essential (primary) hypertension: Secondary | ICD-10-CM

## 2019-12-19 DIAGNOSIS — E1169 Type 2 diabetes mellitus with other specified complication: Secondary | ICD-10-CM | POA: Diagnosis not present

## 2019-12-19 LAB — POCT GLYCOSYLATED HEMOGLOBIN (HGB A1C): Hemoglobin A1C: 6.8 % — AB (ref 4.0–5.6)

## 2019-12-19 NOTE — Assessment & Plan Note (Signed)
Mild elevated BP - improve on re-check, still seems above goal - Home BP readings none available  No known complications   Plan:  1. Continue current BP regimen - HCTZ 25mg  daily, Metoprolol XL 50mg  daily, Lisinopril 20mg  daily 2. Encourage improved lifestyle - emphasized importance of low sodium diet, regular exercise 3. Keep monitor BP outside office, bring readings to next visit, if persistently >140/90 or new symptoms notify office sooner  Consider add or swap med for Amlodipine in future

## 2019-12-19 NOTE — Assessment & Plan Note (Addendum)
Improved A1c to 6.8 No hypoglycemia or hyperglycemia Complications - hyperlipidemia, obesity, hypothyroidism, - increases risk of future cardiovascular complications   Plan:  1. Continue current therapy - Metformin 1000mg  BID 2. Encourage improved lifestyle - low carb, low sugar diet, reduce portion size, continue improving regular exercise 3. Check CBG , bring log to next visit for review 4. Continue ACEi, Statin

## 2019-12-19 NOTE — Patient Instructions (Addendum)
Thank you for coming to the office today.  Keep up the good work overall  Recent Labs    04/03/19 0808 08/19/19 1000 12/19/19 0825  HGBA1C 7.3* 7.0* 6.8*    Check BP call or come back sooner if consistently >150/90  DUE for FASTING BLOOD WORK (no food or drink after midnight before the lab appointment, only water or coffee without cream/sugar on the morning of)  SCHEDULE "Lab Only" visit in the morning at the clinic for lab draw in 6 MONTHS   - Make sure Lab Only appointment is at about 1 week before your next appointment, so that results will be available  For Lab Results, once available within 2-3 days of blood draw, you can can log in to MyChart online to view your results and a brief explanation. Also, we can discuss results at next follow-up visit.   Please schedule a Follow-up Appointment to: Return in about 6 months (around 06/17/2020) for Annual Physical.  If you have any other questions or concerns, please feel free to call the office or send a message through [place]. You may also schedule an earlier appointment if necessary.  Additionally, you may be receiving a survey about your experience at our office within a few days to 1 week by e-mail or mail. We value your feedback.  Nobie Putnam, DO [place]

## 2019-12-19 NOTE — Progress Notes (Signed)
Subjective:    Patient ID: Brady Sanchez, male    DOB: 10-22-1946, 74 y.o.   MRN: [id number]  Brady Sanchez is a 74 y.o. male presenting on 12/19/2019 for Diabetes and Hypertension   HPI   CHRONIC DM, Type 2/ Obesity BMI >33 Recent A1c trend 7.3 down to 7.0. Due today for A1c No new concerns. Not checking sugar regularly. CBGs:none recently Meds:Metformin 1000mg  BID Reports good compliance. Tolerating well w/o side-effects Currently on ACEi Lifestyle:  - Diet (low sugar low carb diet, trying to improve)  - Exercise (limited recently) S/p DM Eye Exam Dr Matilde Sprang 06/23/19. He is s/p cataract removal surgery 9/17 and 9/27 with improved vision able to read more, less blurry. Denies hypoglycemia, polyuria, visual changes, numbness or tingling.  CHRONIC HTN: Office BP readings have been elevated in past. Not checking BP regularly Previous visit 03/2019 - increased HCTZ from 12.5 to 25 Current Meds -Lisinopril 20mg  daily, HCTZ 25mg  daily, Metoprolol XL 50mg  daily Reports good compliance, took meds today. Tolerating well, w/o complaints. Denies CP, dyspnea, edema, dizziness / lightheadedness, HA  Health Maintenance: Will get COVID19 vaccine 1st dose next week through work.  Depression screen [facility] 2/9 12/19/2019 08/19/2019 04/10/2019  Decreased Interest 0 0 0  Down, Depressed, Hopeless 0 0 0  PHQ - 2 Score 0 0 0  Altered sleeping - 0 -  Tired, decreased energy - 0 -  Change in appetite - 0 -  Feeling bad or failure about yourself  - 0 -  Trouble concentrating - 0 -  Moving slowly or fidgety/restless - 0 -  Suicidal thoughts - 0 -  PHQ-9 Score - 0 -    Social History   Tobacco Use  . Smoking status: Never Smoker  . Smokeless tobacco: Never Used  Substance Use Topics  . Alcohol use: Yes  . Drug use: No    Review of Systems Per HPI unless specifically indicated above     Objective:    BP (!) 147/65 (BP Location: Left Arm, Cuff Size: Normal)   Pulse 62   Temp  97.7 F (36.5 C) (Oral)   Resp 16   Ht 5\' 9"  (1.753 m)   Wt 228 lb (103.4 kg)   BMI 33.67 kg/m   Wt Readings from Last 3 Encounters:  12/19/19 228 lb (103.4 kg)  08/19/19 227 lb (103 kg)  08/06/19 240 lb (108.9 kg)    Physical Exam Vitals and nursing note reviewed.  Constitutional:      General: He is not in acute distress.    Appearance: He is well-developed. He is not diaphoretic.     Comments: Well-appearing, comfortable, cooperative  HENT:     Head: Normocephalic and atraumatic.  Eyes:     General:        Right eye: No discharge.        Left eye: No discharge.     Conjunctiva/sclera: Conjunctivae normal.  Neck:     Thyroid: No thyromegaly.  Cardiovascular:     Rate and Rhythm: Normal rate and regular rhythm.     Heart sounds: Normal heart sounds. No murmur.  Pulmonary:     Effort: Pulmonary effort is normal. No respiratory distress.     Breath sounds: Normal breath sounds. No wheezing or rales.  Musculoskeletal:        General: Normal range of motion.     Cervical back: Normal range of motion and neck supple.  Lymphadenopathy:     Cervical: No cervical  adenopathy.  Skin:    General: Skin is warm and dry.     Findings: No erythema or rash.  Neurological:     Mental Status: He is alert and oriented to person, place, and time.  Psychiatric:        Behavior: Behavior normal.     Comments: Well groomed, good eye contact, normal speech and thoughts      Diabetic Foot Exam - Simple   Simple Foot Form Diabetic Foot exam was performed with the following findings: Yes 12/19/2019  8:31 AM  Visual Inspection No deformities, no ulcerations, no other skin breakdown bilaterally: Yes Sensation Testing Intact to touch and monofilament testing bilaterally: Yes Pulse Check Posterior Tibialis and Dorsalis pulse intact bilaterally: Yes Comments     Recent Labs    04/03/19 0808 08/19/19 1000 12/19/19 0825  HGBA1C 7.3* 7.0* 6.8*    Results for orders placed or  performed in visit on 12/19/19  POCT HgB A1C  Result Value Ref Range   Hemoglobin A1C 6.8 (A) 4.0 - 5.6 %      Assessment & Plan:   Problem List Items Addressed This Visit    Type 2 diabetes mellitus with other specified complication ([place]) - Primary    Improved A1c to 6.8 No hypoglycemia or hyperglycemia Complications - hyperlipidemia, obesity, hypothyroidism, - increases risk of future cardiovascular complications   Plan:  1. Continue current therapy - Metformin 1000mg  BID 2. Encourage improved lifestyle - low carb, low sugar diet, reduce portion size, continue improving regular exercise 3. Check CBG , bring log to next visit for review 4. Continue ACEi, Statin      Relevant Orders   POCT HgB A1C (Completed)   Hypertension    Mild elevated BP - improve on re-check, still seems above goal - Home BP readings none available  No known complications   Plan:  1. Continue current BP regimen - HCTZ 25mg  daily, Metoprolol XL 50mg  daily, Lisinopril 20mg  daily 2. Encourage improved lifestyle - emphasized importance of low sodium diet, regular exercise 3. Keep monitor BP outside office, bring readings to next visit, if persistently >140/90 or new symptoms notify office sooner  Consider add or swap med for Amlodipine in future         No orders of the defined types were placed in this encounter.     Follow up plan: Return in about 6 months (around 06/17/2020) for Annual Physical.  Nobie Putnam, DO [facility] 12/19/2019, 8:16 AM

## 2019-12-29 ENCOUNTER — Other Ambulatory Visit: Payer: Self-pay

## 2019-12-29 ENCOUNTER — Ambulatory Visit (INDEPENDENT_AMBULATORY_CARE_PROVIDER_SITE_OTHER): Payer: BC Managed Care – PPO | Admitting: Family Medicine

## 2019-12-29 ENCOUNTER — Encounter: Payer: Self-pay | Admitting: Family Medicine

## 2019-12-29 VITALS — Temp 97.8°F

## 2019-12-29 DIAGNOSIS — U071 COVID-19: Secondary | ICD-10-CM

## 2019-12-29 NOTE — Progress Notes (Signed)
Virtual Visit via Telephone The purpose of this virtual visit is to provide medical care while limiting exposure to the novel coronavirus (COVID19) for both patient and office staff.  Consent was obtained for phone visit:  Yes.   Answered questions that patient had about telehealth interaction:  Yes.   I discussed the limitations, risks, security and privacy concerns of performing an evaluation and management service by telephone. I also discussed with the patient that there may be a patient responsible charge related to this service. The patient expressed understanding and agreed to proceed.  Patient Location: Home Provider Location: [facility])   ---------------------------------------------------------------------- Chief Complaint  Patient presents with  . COVID    chest congestion x 2.5 weeks, diagnose with COVID x 6 days ago    S: Reviewed CMA documentation. I have called patient and gathered additional HPI as follows:  COVID19 Infection 1st dose COVID19 vaccine on 12/22/19 He tested positive on 12/23/19 for COVID Describes deeper chest congestion for few week now. He is taking Mucinex OTC with some limited relief. He has the ProAir Inhaler but not using it.  Patient currently in isolation  Denies any fevers, chills, sweats, body ache, cough, shortness of breath, sinus pain or pressure, headache, abdominal pain, diarrhea  -------------------------------------------------------------------------- O: No physical exam performed due to remote telephone encounter.  -------------------------------------------------------------------------- A&P:   COVID19 with chest congestion - Reassuring without high risk symptoms - Afebrile, without dyspnea - No comorbid pulmonary conditions (asthma, COPD) or immunocompromise  Unusual timing with s/p COVID19 vaccine #1 1 day prior to diagnosis POSITIVE. Question if he had a prior exposure and test resulted false positive  or not active infection. Difficult to discern.  1. Continue OTC supportive medications mucinex, Tylenol, cough suppressant PRN 2. START existing ProAir albuterol inhaler PRN to help open air way and help him clear chest congestion  Scheduled close follow-up with [facility] for [facility] 2/17, 530pm for evaluation F-F can get chest x-ray and pulse ox evaluation.  No orders of the defined types were placed in this encounter.   REQUIRED self quarantine to [place] - advised to avoid all exposure with others while during treatment. Should continue to quarantine for up to 7-14 days - pending resolution of symptoms, if TEST IS NEGATIVE and symptoms resolve by 7 days and is afebrile >3 days - may STOP self quarantine at that time. IF test is POSITIVE then will require 10 additional day quarantine after date of positive test result.  If symptoms do not resolve or significantly improve OR if WORSENING - fever / cough - or worsening shortness of breath - then should contact us and seek advice on next steps in treatment at home vs where/when to seek care at Urgent Care or Hospital ED for further intervention and possible testing if indicated.  Patient verbalizes understanding with the above medical recommendations including the limitation of remote medical advice.  Specific follow-up / call-back criteria were given for patient to follow-up or seek medical care more urgently if needed.   - Time spent in direct consultation with patient on phone: 9 minutes   Nobie Putnam, [facility] 12/29/2019, 4:29 PM

## 2019-12-31 ENCOUNTER — Ambulatory Visit
Admission: RE | Admit: 2019-12-31 | Discharge: 2019-12-31 | Disposition: A | Discharge status: Home / Self Care | Payer: BC Managed Care – PPO | Source: Ambulatory Visit | Attending: Family Medicine | Admitting: Family Medicine

## 2019-12-31 ENCOUNTER — Ambulatory Visit (INDEPENDENT_AMBULATORY_CARE_PROVIDER_SITE_OTHER): Payer: BC Managed Care – PPO | Admitting: Family Medicine

## 2019-12-31 ENCOUNTER — Ambulatory Visit: Admission: RE | Admit: 2019-12-31 | Payer: BC Managed Care – PPO | Source: Ambulatory Visit | Admitting: *Deleted

## 2019-12-31 VITALS — BP 174/74 | HR 62 | Temp 98.3°F

## 2019-12-31 DIAGNOSIS — R05 Cough: Secondary | ICD-10-CM

## 2019-12-31 DIAGNOSIS — R0989 Other specified symptoms and signs involving the circulatory and respiratory systems: Secondary | ICD-10-CM

## 2019-12-31 DIAGNOSIS — U071 COVID-19: Secondary | ICD-10-CM | POA: Diagnosis not present

## 2019-12-31 DIAGNOSIS — J3089 Other allergic rhinitis: Secondary | ICD-10-CM | POA: Diagnosis not present

## 2019-12-31 DIAGNOSIS — J9811 Atelectasis: Secondary | ICD-10-CM | POA: Diagnosis not present

## 2019-12-31 DIAGNOSIS — R058 Other specified cough: Secondary | ICD-10-CM

## 2019-12-31 NOTE — Progress Notes (Signed)
BP (!) 174/74 (BP Location: Right Leg, Cuff Size: Normal)   Pulse 62   Temp 98.3 F (36.8 C) (Oral)   SpO2 100%    Subjective:    Patient ID: Brady Sanchez, male    DOB: 11-Dec-1945, 74 y.o.   MRN: [id number]  HPI: Brady Sanchez is a 74 y.o. male  Chief Complaint  Patient presents with  . Chest Congestion    pt dx with COVID 12/23/19. States he is concerned with some congestion, denies SOB, fever, chills,   Patient presenting today for in person evaluation for ongoing productive cough. Has had chest congestion for about 2 weeks prior to testing positive for COVID 19 on 12/23/19. No fever, chills, body aches, CP, SOB, wheezing, N/V/D. Was to be taking albuterol inhaler and mucinex but stopped mucinex after a day or so because he didn't see where it was helping him and could not figure out how to use his inhaler. Currently not taking anything OTC for sxs.   Relevant past medical, surgical, family and social history reviewed and updated as indicated. Interim medical history since our last visit reviewed. Allergies and medications reviewed and updated.  Review of Systems  Per HPI unless specifically indicated above     Objective:    BP (!) 174/74 (BP Location: Right Leg, Cuff Size: Normal)   Pulse 62   Temp 98.3 F (36.8 C) (Oral)   SpO2 100%   Wt Readings from Last 3 Encounters:  12/19/19 228 lb (103.4 kg)  08/19/19 227 lb (103 kg)  08/06/19 240 lb (108.9 kg)    Physical Exam Vitals and nursing note reviewed.  Constitutional:      Appearance: Normal appearance.  HENT:     Head: Atraumatic.     Nose: Nose normal. No congestion or rhinorrhea.     Mouth/Throat:     Mouth: Mucous membranes are moist.     Pharynx: Oropharynx is clear.  Eyes:     Extraocular Movements: Extraocular movements intact.     Conjunctiva/sclera: Conjunctivae normal.  Cardiovascular:     Rate and Rhythm: Normal rate and regular rhythm.  Pulmonary:     Effort: Pulmonary effort is normal.  No respiratory distress.     Breath sounds: Normal breath sounds. No wheezing or rales.  Musculoskeletal:        General: Normal range of motion.     Cervical back: Normal range of motion and neck supple.  Skin:    General: Skin is warm and dry.  Neurological:     General: No focal deficit present.     Mental Status: He is oriented to person, place, and time.  Psychiatric:        Mood and Affect: Mood normal.        Thought Content: Thought content normal.        Judgment: Judgment normal.     Results for orders placed or performed in visit on 12/19/19  POCT HgB A1C  Result Value Ref Range   Hemoglobin A1C 6.8 (A) 4.0 - 5.6 %      Assessment & Plan:   Problem List Items Addressed This Visit    None    Visit Diagnoses    COVID-19 virus infection    -  Primary   Relevant Orders   DG Chest 2 View (Completed)   Productive cough       Seasonal allergic rhinitis due to other allergic trigger  Patient well appearing today and feels in his usual state of health aside from an ongoing productive cough he's had for weeks prior to COVID dx. Suspect his cough sxs more related to seasonal allergies than COVID infection. Suggested getting back on mucinex and giving that more time to help as he'd only taken it for a day or so previously, and instructed him on proper use of his albuterol inhaler. Also suggested consistent use of antihistamine and flonase to help control any allergy component to his sxs. CXR showing no signs of pneumonia. Return precautions reviewed with patient.   25 minutes today spent in direct patient care and counseling  Follow up plan: Return if symptoms worsen or fail to improve.

## 2020-01-26 DIAGNOSIS — M25531 Pain in right wrist: Secondary | ICD-10-CM | POA: Diagnosis not present

## 2020-02-03 ENCOUNTER — Other Ambulatory Visit: Payer: Self-pay | Admitting: Family Medicine

## 2020-02-03 DIAGNOSIS — I10 Essential (primary) hypertension: Secondary | ICD-10-CM

## 2020-03-30 ENCOUNTER — Other Ambulatory Visit: Payer: Self-pay | Admitting: Family Medicine

## 2020-05-05 DIAGNOSIS — M25531 Pain in right wrist: Secondary | ICD-10-CM | POA: Diagnosis not present

## 2020-06-14 ENCOUNTER — Telehealth: Payer: Self-pay | Admitting: Family Medicine

## 2020-06-14 ENCOUNTER — Other Ambulatory Visit: Payer: Medicare Other

## 2020-06-14 DIAGNOSIS — R7309 Other abnormal glucose: Secondary | ICD-10-CM

## 2020-06-14 DIAGNOSIS — R351 Nocturia: Secondary | ICD-10-CM

## 2020-06-14 DIAGNOSIS — Z0000 Encounter for general adult medical examination without abnormal findings: Secondary | ICD-10-CM

## 2020-06-14 DIAGNOSIS — I10 Essential (primary) hypertension: Secondary | ICD-10-CM

## 2020-06-14 DIAGNOSIS — E785 Hyperlipidemia, unspecified: Secondary | ICD-10-CM

## 2020-06-14 NOTE — Telephone Encounter (Signed)
Signed labs

## 2020-06-15 LAB — COMPLETE METABOLIC PANEL WITH GFR
AG Ratio: 1.4 (calc) (ref 1.0–2.5)
ALT: 17 U/L (ref 9–46)
AST: 19 U/L (ref 10–35)
Albumin: 4.2 g/dL (ref 3.6–5.1)
Alkaline phosphatase (APISO): 45 U/L (ref 35–144)
BUN: 10 mg/dL (ref 7–25)
CO2: 28 mmol/L (ref 20–32)
Calcium: 9.2 mg/dL (ref 8.6–10.3)
Chloride: 99 mmol/L (ref 98–110)
Creat: 1.03 mg/dL (ref 0.70–1.18)
GFR, Est African American: 83 mL/min/{1.73_m2} (ref >=60)
GFR, Est Non African American: 71 mL/min/{1.73_m2} (ref >=60)
Globulin: 2.9 g/dL (calc) (ref 1.9–3.7)
Glucose, Bld: 201 mg/dL — ABNORMAL HIGH (ref 65–99)
Potassium: 4 mmol/L (ref 3.5–5.3)
Sodium: 136 mmol/L (ref 135–146)
Total Bilirubin: 0.6 mg/dL (ref 0.2–1.2)
Total Protein: 7.1 g/dL (ref 6.1–8.1)

## 2020-06-15 LAB — LIPID PANEL
Cholesterol: 146 mg/dL (ref <200)
HDL: 44 mg/dL (ref >=40)
LDL Cholesterol (Calc): 78 mg/dL (calc)
Non-HDL Cholesterol (Calc): 102 mg/dL (calc) (ref <130)
Total CHOL/HDL Ratio: 3.3 (calc) (ref <5.0)
Triglycerides: 141 mg/dL (ref <150)

## 2020-06-15 LAB — HEMOGLOBIN A1C
Hgb A1c MFr Bld: 7.3 % of total Hgb — ABNORMAL HIGH (ref <5.7)
Mean Plasma Glucose: 163 (calc)
eAG (mmol/L): 9 (calc)

## 2020-06-15 LAB — CBC WITH DIFFERENTIAL/PLATELET
Absolute Monocytes: 545 cells/uL (ref 200–950)
Basophils Absolute: 17 cells/uL (ref 0–200)
Basophils Relative: 0.3 %
Eosinophils Absolute: 122 cells/uL (ref 15–500)
Eosinophils Relative: 2.1 %
HCT: 45.3 % (ref 38.5–50.0)
Hemoglobin: 14.7 g/dL (ref 13.2–17.1)
Lymphs Abs: 1270 cells/uL (ref 850–3900)
MCH: 26.9 pg — ABNORMAL LOW (ref 27.0–33.0)
MCHC: 32.5 g/dL (ref 32.0–36.0)
MCV: 82.8 fL (ref 80.0–100.0)
MPV: 11.4 fL (ref 7.5–12.5)
Monocytes Relative: 9.4 %
Neutro Abs: 3845 cells/uL (ref 1500–7800)
Neutrophils Relative %: 66.3 %
Platelets: 187 10*3/uL (ref 140–400)
RBC: 5.47 10*6/uL (ref 4.20–5.80)
RDW: 13.1 % (ref 11.0–15.0)
Total Lymphocyte: 21.9 %
WBC: 5.8 10*3/uL (ref 3.8–10.8)

## 2020-06-15 LAB — PSA: PSA: 2.2 ng/mL (ref <=4.0)

## 2020-06-17 ENCOUNTER — Encounter: Payer: BC Managed Care – PPO | Admitting: Family Medicine

## 2020-06-18 ENCOUNTER — Other Ambulatory Visit: Payer: Self-pay

## 2020-06-18 ENCOUNTER — Ambulatory Visit (INDEPENDENT_AMBULATORY_CARE_PROVIDER_SITE_OTHER): Payer: Medicare Other | Admitting: Family Medicine

## 2020-06-18 ENCOUNTER — Encounter: Payer: Self-pay | Admitting: Family Medicine

## 2020-06-18 VITALS — BP 148/70 | HR 59 | Temp 97.3°F | Resp 16 | Ht 69.0 in | Wt 227.0 lb

## 2020-06-18 DIAGNOSIS — E785 Hyperlipidemia, unspecified: Secondary | ICD-10-CM

## 2020-06-18 DIAGNOSIS — R011 Cardiac murmur, unspecified: Secondary | ICD-10-CM | POA: Diagnosis not present

## 2020-06-18 DIAGNOSIS — I10 Essential (primary) hypertension: Secondary | ICD-10-CM

## 2020-06-18 DIAGNOSIS — E1169 Type 2 diabetes mellitus with other specified complication: Secondary | ICD-10-CM | POA: Diagnosis not present

## 2020-06-18 MED ORDER — METFORMIN HCL 1000 MG PO TABS: 1000.0000 mg | ORAL_TABLET | Freq: Two times a day (BID) | ORAL | 3 refills | Status: DC

## 2020-06-18 MED ORDER — HYDROCHLOROTHIAZIDE 25 MG PO TABS: 25.0000 mg | ORAL_TABLET | Freq: Every day | ORAL | 1 refills | Status: DC

## 2020-06-18 MED ORDER — METOPROLOL SUCCINATE ER 50 MG PO TB24: 50.0000 mg | ORAL_TABLET | Freq: Every day | ORAL | 1 refills | Status: DC

## 2020-06-18 MED ORDER — LISINOPRIL 40 MG PO TABS: 40.0000 mg | ORAL_TABLET | Freq: Every day | ORAL | 1 refills | Status: DC

## 2020-06-18 MED ORDER — ATORVASTATIN CALCIUM 20 MG PO TABS: 20.0000 mg | ORAL_TABLET | Freq: Every day | ORAL | 1 refills | Status: DC

## 2020-06-18 NOTE — Assessment & Plan Note (Signed)
Stable controlled for age, A1c 7.3 No hypoglycemia or hyperglycemia Complications - hyperlipidemia, obesity, hypothyroidism, - increases risk of future cardiovascular complications   Plan:  1. Continue current therapy - Metformin 1000mg  BID 2. Encourage improved lifestyle - low carb, low sugar diet, reduce portion size, continue improving regular exercise 3. Check CBG , bring log to next visit for review 4. Continue ACEi, Statin

## 2020-06-18 NOTE — Assessment & Plan Note (Signed)
Asymtpomatic New finding, abnormal exam today Systolic 2/6 LSB With radiation to carotids May be due to higher BP No prior ECHO Will order ECHOcardiogram, and f/u within 3 months

## 2020-06-18 NOTE — Patient Instructions (Addendum)
Thank you for coming to the office today.  Increase Lisinopril from 20 to 40. Now take TWO of your current 20mg  tablets. I have sent new order for Lisinopril 40mg , the new pill would be - just take ONE daily.  In future if this isn't working, we can try SWAPPING the Metoprolol 50mg  for the new medicine - Amlodipine 5 or 10mg . As discussed.  Keep track of BP occasionally at work, let me know if this is not working, goal is BP < 140/90.  ECHOcardiogram ordered. Newly detected heart murmur today. Can be due to abnormal blood flow in heart valve with higher pressure.  They will call you to schedule in October 2021, about 2-4 weeks before your apt with me.   Please schedule a Follow-up Appointment to: Return in about 3 months (around 09/18/2020) for 3 month HTN, DM A1c.  If you have any other questions or concerns, please feel free to call the office or send a message through [facility]. You may also schedule an earlier appointment if necessary.  Additionally, you may be receiving a survey about your experience at our office within a few days to 1 week by e-mail or mail. We value your feedback.  Nobie Putnam, DO [facility]

## 2020-06-18 NOTE — Assessment & Plan Note (Signed)
Controlled cholesterol on statin and lifestyle Last lipid panel 06/2020  Plan: 1. Continue current meds - Atorvastatin 20mg  2. Encourage improved lifestyle - low carb/cholesterol, reduce portion size, continue improving regular exercise

## 2020-06-18 NOTE — Assessment & Plan Note (Signed)
Again elevated BP - improve on re-check, still seems above goal - Home BP readings none available  No known complications   Plan:  1. Discussed options - either stop Metoprolol XL 50 and swap to Amlodipine 5-10mg , or INCREASE Lisinopril 20 to 40mg  - he opted to increase Lisinopril existing med, he has 20mg  tabs, take TWO of 20mg  = 40mg  daily. ALSO I have ordered Lisinopril 40mg  daily new tab - Continue HCTZ 25mg  daily, Metoprolol XL 50mg  daily 2. Encourage improved lifestyle - emphasized importance of low sodium diet, regular exercise 3. Keep monitor BP outside office, bring readings to next visit, if persistently >140/90 or new symptoms notify office sooner  He will check BP at work and notify us if need  New concern heart murmur, see A&P

## 2020-06-18 NOTE — Progress Notes (Signed)
Subjective:    Patient ID: Brady Sanchez, male    DOB: 06/20/1946, 74 y.o.   MRN: [id number]  Brady Sanchez is a 74 y.o. male presenting on 06/18/2020 for Hypertension   HPI  CHRONIC DM, Type 2/ Obesity BMI >33 A1c at 7.3 on last lab. No new concerns. Not checking sugar regularly. CBGs:none recently Meds:Metformin 1000mg  BID Reports good compliance. Tolerating well w/o side-effects Currently on ACEi Lifestyle:  - Diet (low sugar low carb diet, trying to improve)  - Exercise (limited recently) S/p DM Eye Exam Dr Matilde Sprang 06/23/19. He is s/p cataract removal surgery 9/17 and 9/27 with improved vision able to read more, less blurry. Denies hypoglycemia, polyuria, visual changes, numbness or tingling.  CHRONIC HTN: Elevated SBP recently. He does not check BP regularly. Previous visit 03/2019 - increased HCTZ from 12.5 to 25 Today still not at goal BP He says no particular other reason for Metoprolol. No heart history, no tachycardia or MI. Current Meds -Lisinopril 20mg  daily, HCTZ25mg  daily, Metoprolol XL 50mg  daily Reports good compliance, took meds today. Tolerating well, w/o complaints. Denies CP, dyspnea, edema, dizziness / lightheadedness, HA  HYPERLIPIDEMIA: - Reports no concerns. Last lipid panel 06/2020, controlled  - Currently taking Atorvastatin 20mg , tolerating well without side effects or myalgias  Heart Murmur New problem. He was not aware. Not heard on prior exams. See below Denies symptoms  Health Maintenance: UTD COVID vaccine Due for Flu Shot, declines today despite counseling on benefits Declines PNA vaccine.  Depression screen [facility] 2/9 06/18/2020 12/19/2019 08/19/2019  Decreased Interest 0 0 0  Down, Depressed, Hopeless 0 0 0  PHQ - 2 Score 0 0 0  Altered sleeping - - 0  Tired, decreased energy - - 0  Change in appetite - - 0  Feeling bad or failure about yourself  - - 0  Trouble concentrating - - 0  Moving slowly or fidgety/restless - - 0    Suicidal thoughts - - 0  PHQ-9 Score - - 0    Past Medical History:  Diagnosis Date  . Hyperlipidemia   . Hypertension    Past Surgical History:  Procedure Laterality Date  . CATARACT EXTRACTION    . REPLACEMENT TOTAL KNEE     Social History   Socioeconomic History  . Marital status: Widowed    Spouse name: Not on file  . Number of children: Not on file  . Years of education: Not on file  . Highest education level: Not on file  Occupational History  . 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 Smoking status: Never Smoker  . Smokeless tobacco: Never Used  Substance and Sexual Activity  . Alcohol use: Yes  . Drug use: No  . Sexual activity: Not on file  Other Topics Concern  . Not on file  Social History Narrative  . Not on file   Social Determinants of Health   Financial Resource Strain:   . Difficulty of Paying Living Expenses:   Food Insecurity:   . Worried About Charity fundraiser in the Last Year:   . Arboriculturist in the Last Year:   Transportation Needs:   . Film/video editor (Medical):   Marland Kitchen Lack of Transportation (Non-Medical):   Physical Activity:   . Days of Exercise per Week:   . Minutes of Exercise per Session:   Stress:   . Feeling of Stress :   Social Connections:   . Frequency  of Communication with Friends and Family:   . Frequency of Social Gatherings with Friends and Family:   . Attends Religious Services:   . Active Member of Clubs or Organizations:   . Attends Archivist Meetings:   Marland Kitchen Marital Status:   Intimate Partner Violence:   . Fear of Current or Ex-Partner:   . Emotionally Abused:   Marland Kitchen Physically Abused:   . Sexually Abused:    Family History  Problem Relation Age of Onset  . Seizures Mother   . Cancer Father        lung   Current Outpatient Medications on File Prior to Visit  Medication Sig  . Albuterol Sulfate (PROAIR RESPICLICK) 397 (90 Base) MCG/ACT AEPB Inhale 2 puffs into  the lungs every 4 (four) hours as needed.  . fluticasone (FLONASE) 50 MCG/ACT nasal spray PLACE 2 SPRAYS INTO BOTH NOSTRILS DAILY. USE FOR 4-6 WEEKS THEN STOP AND USE SEASONALLY OR AS NEEDED.  Marland Kitchen triamcinolone cream (KENALOG) 0.5 % APPLY TOPICALLY TO AFFECTED AREA EVERY DAY   No current facility-administered medications on file prior to visit.    Review of Systems Per HPI unless specifically indicated above     Objective:    BP (!) 148/70 (BP Location: Left Arm, Cuff Size: Normal)   Pulse (!) 59   Temp (!) 97.3 F (36.3 C) (Temporal)   Resp 16   Ht 5\' 9"  (1.753 m)   Wt 227 lb (103 kg)   SpO2 100%   BMI 33.52 kg/m   Wt Readings from Last 3 Encounters:  06/18/20 227 lb (103 kg)  12/19/19 228 lb (103.4 kg)  08/19/19 227 lb (103 kg)    Physical Exam Vitals and nursing note reviewed.  Constitutional:      General: He is not in acute distress.    Appearance: He is well-developed. He is not diaphoretic.     Comments: Well-appearing, comfortable, cooperative  HENT:     Head: Normocephalic and atraumatic.  Eyes:     General:        Right eye: No discharge.        Left eye: No discharge.     Conjunctiva/sclera: Conjunctivae normal.     Pupils: Pupils are equal, round, and reactive to light.  Neck:     Thyroid: No thyromegaly.     Vascular: Carotid bruit (bilateral) present.  Cardiovascular:     Rate and Rhythm: Normal rate and regular rhythm.     Heart sounds: Murmur (2/6 systolic murmur left sternal border, radiate to bilateral carotids) heard.   Pulmonary:     Effort: Pulmonary effort is normal. No respiratory distress.     Breath sounds: Normal breath sounds. No wheezing or rales.  Abdominal:     General: Bowel sounds are normal. There is no distension.     Palpations: Abdomen is soft. There is no mass.     Tenderness: There is no abdominal tenderness.  Musculoskeletal:        General: No tenderness. Normal range of motion.     Cervical back: Normal range of motion  and neck supple.     Comments: Upper / Lower Extremities: - Normal muscle tone, strength bilateral upper extremities 5/5, lower extremities 5/5  Lymphadenopathy:     Cervical: No cervical adenopathy.  Skin:    General: Skin is warm and dry.     Findings: No erythema or rash.  Neurological:     Mental Status: He is alert and oriented to person,  place, and time.     Comments: Distal sensation intact to light touch all extremities  Psychiatric:        Behavior: Behavior normal.     Comments: Well groomed, good eye contact, normal speech and thoughts       Results for orders placed or performed in visit on 06/14/20  COMPLETE METABOLIC PANEL WITH GFR  Result Value Ref Range   Glucose, Bld 201 (H) 65 - 99 mg/dL   BUN 10 7 - 25 mg/dL   Creat 1.03 0.70 - 1.18 mg/dL   GFR, Est Non African American 71 > OR = 60 mL/min/1.28m2   GFR, Est African American 83 > OR = 60 mL/min/1.65m2   BUN/Creatinine Ratio NOT APPLICABLE 6 - 22 (calc)   Sodium 136 135 - 146 mmol/L   Potassium 4.0 3.5 - 5.3 mmol/L   Chloride 99 98 - 110 mmol/L   CO2 28 20 - 32 mmol/L   Calcium 9.2 8.6 - 10.3 mg/dL   Total Protein 7.1 6.1 - 8.1 g/dL   Albumin 4.2 3.6 - 5.1 g/dL   Globulin 2.9 1.9 - 3.7 g/dL (calc)   AG Ratio 1.4 1.0 - 2.5 (calc)   Total Bilirubin 0.6 0.2 - 1.2 mg/dL   Alkaline phosphatase (APISO) 45 35 - 144 U/L   AST 19 10 - 35 U/L   ALT 17 9 - 46 U/L  Hemoglobin A1c  Result Value Ref Range   Hgb A1c MFr Bld 7.3 (H) <5.7 % of total Hgb   Mean Plasma Glucose 163 (calc)   eAG (mmol/L) 9.0 (calc)  Lipid panel  Result Value Ref Range   Cholesterol 146 <200 mg/dL   HDL 44 > OR = 40 mg/dL   Triglycerides 141 <150 mg/dL   LDL Cholesterol (Calc) 78 mg/dL (calc)   Total CHOL/HDL Ratio 3.3 <5.0 (calc)   Non-HDL Cholesterol (Calc) 102 <130 mg/dL (calc)  CBC with Differential/Platelet  Result Value Ref Range   WBC 5.8 3.8 - 10.8 Thousand/uL   RBC 5.47 4.20 - 5.80 Million/uL   Hemoglobin 14.7 13.2 - 17.1  g/dL   HCT 45.3 38 - 50 %   MCV 82.8 80.0 - 100.0 fL   MCH 26.9 (L) 27.0 - 33.0 pg   MCHC 32.5 32.0 - 36.0 g/dL   RDW 13.1 11.0 - 15.0 %   Platelets 187 140 - 400 Thousand/uL   MPV 11.4 7.5 - 12.5 fL   Neutro Abs 3,845 1,500 - 7,800 cells/uL   Lymphs Abs 1,270 850 - 3,900 cells/uL   Absolute Monocytes 545 200 - 950 cells/uL   Eosinophils Absolute 122 15 - 500 cells/uL   Basophils Absolute 17 0 - 200 cells/uL   Neutrophils Relative % 66.3 %   Total Lymphocyte 21.9 %   Monocytes Relative 9.4 %   Eosinophils Relative 2.1 %   Basophils Relative 0.3 %  PSA  Result Value Ref Range   PSA 2.2 < OR = 4.0 ng/mL      Assessment & Plan:   Problem List Items Addressed This Visit    Type 2 diabetes mellitus with other specified complication (HCC)    Stable controlled for age, A1c 7.3 No hypoglycemia or hyperglycemia Complications - hyperlipidemia, obesity, hypothyroidism, - increases risk of future cardiovascular complications   Plan:  1. Continue current therapy - Metformin 1000mg  BID 2. Encourage improved lifestyle - low carb, low sugar diet, reduce portion size, continue improving regular exercise 3. Check CBG , bring log to  next visit for review 4. Continue ACEi, Statin      Relevant Medications   lisinopril (ZESTRIL) 40 MG tablet   atorvastatin (LIPITOR) 20 MG tablet   metFORMIN (GLUCOPHAGE) 1000 MG tablet   Newly recognized heart murmur    Asymtpomatic New finding, abnormal exam today Systolic 2/6 LSB With radiation to carotids May be due to higher BP No prior ECHO Will order ECHOcardiogram, and f/u within 3 months      Relevant Orders   ECHOCARDIOGRAM COMPLETE   Hypertension - Primary    Again elevated BP - improve on re-check, still seems above goal - Home BP readings none available  No known complications   Plan:  1. Discussed options - either stop Metoprolol XL 50 and swap to Amlodipine 5-10mg , or INCREASE Lisinopril 20 to 40mg  - he opted to increase Lisinopril  existing med, he has 20mg  tabs, take TWO of 20mg  = 40mg  daily. ALSO I have ordered Lisinopril 40mg  daily new tab - Continue HCTZ 25mg  daily, Metoprolol XL 50mg  daily 2. Encourage improved lifestyle - emphasized importance of low sodium diet, regular exercise 3. Keep monitor BP outside office, bring readings to next visit, if persistently >140/90 or new symptoms notify office sooner  He will check BP at work and notify us if need  New concern heart murmur, see A&P      Relevant Medications   metoprolol succinate (TOPROL-XL) 50 MG 24 hr tablet   lisinopril (ZESTRIL) 40 MG tablet   atorvastatin (LIPITOR) 20 MG tablet   hydrochlorothiazide (HYDRODIURIL) 25 MG tablet   Other Relevant Orders   ECHOCARDIOGRAM COMPLETE   Hyperlipidemia    Controlled cholesterol on statin and lifestyle Last lipid panel 06/2020  Plan: 1. Continue current meds - Atorvastatin 20mg  2. Encourage improved lifestyle - low carb/cholesterol, reduce portion size, continue improving regular exercise      Relevant Medications   metoprolol succinate (TOPROL-XL) 50 MG 24 hr tablet   lisinopril (ZESTRIL) 40 MG tablet   atorvastatin (LIPITOR) 20 MG tablet   hydrochlorothiazide (HYDRODIURIL) 25 MG tablet      Updated Health Maintenance information Reviewed recent lab results with patient Encouraged improvement to lifestyle with diet and exercise - Goal of weight loss  Orders Placed This Encounter  Procedures  . ECHOCARDIOGRAM COMPLETE    Standing Status:   Future    Standing Expiration Date:   06/18/2021    Scheduling Instructions:     His appointment with me return to PCP is in 3 months, would like ECHO done within 2-4 weeks of returning here, sometime in October 2021 would be good    Order Specific Question:   Where should this test be performed    Answer:   [facility]    Order Specific Question:   Please indicate who you request to read the echo results.    Answer:   [facility]    Order Specific  Question:   Perflutren DEFINITY (image enhancing agent) should be administered unless hypersensitivity or allergy exist    Answer:   Administer Perflutren    Order Specific Question:   Is a special reader required? (athlete or structural heart)    Answer:   No    Order Specific Question:   Does this study need to be read by the Structural team/Level 3 readers?    Answer:   No    Order Specific Question:   Reason for exam-Echo    Answer:   Murmur  785.2 / R01.1  Order Specific Question:   Release to patient    Answer:   Immediate      Meds ordered this encounter  Medications  . metoprolol succinate (TOPROL-XL) 50 MG 24 hr tablet    Sig: Take 1 tablet (50 mg total) by mouth daily. for high blood pressure    Dispense:  90 tablet    Refill:  1  . lisinopril (ZESTRIL) 40 MG tablet    Sig: Take 1 tablet (40 mg total) by mouth daily.    Dispense:  90 tablet    Refill:  1  . atorvastatin (LIPITOR) 20 MG tablet    Sig: Take 1 tablet (20 mg total) by mouth daily.    Dispense:  90 tablet    Refill:  1  . hydrochlorothiazide (HYDRODIURIL) 25 MG tablet    Sig: Take 1 tablet (25 mg total) by mouth daily.    Dispense:  90 tablet    Refill:  1    DX Code Needed  . I10  . metFORMIN (GLUCOPHAGE) 1000 MG tablet    Sig: Take 1 tablet (1,000 mg total) by mouth 2 (two) times daily with a meal.    Dispense:  180 tablet    Refill:  3      Follow up plan: Return in about 3 months (around 09/18/2020) for 3 month HTN, DM A1c.  Nobie Putnam, [facility] 06/18/2020, 8:28 AM

## 2020-08-03 ENCOUNTER — Other Ambulatory Visit: Payer: Self-pay | Admitting: Family Medicine

## 2020-08-03 DIAGNOSIS — I10 Essential (primary) hypertension: Secondary | ICD-10-CM

## 2020-08-24 ENCOUNTER — Other Ambulatory Visit: Payer: Self-pay

## 2020-08-24 ENCOUNTER — Ambulatory Visit
Admission: RE | Admit: 2020-08-24 | Discharge: 2020-08-24 | Disposition: A | Discharge status: Home / Self Care | Payer: Medicare Other | Source: Ambulatory Visit | Attending: Family Medicine | Admitting: Family Medicine

## 2020-08-24 DIAGNOSIS — R011 Cardiac murmur, unspecified: Secondary | ICD-10-CM | POA: Diagnosis not present

## 2020-08-24 DIAGNOSIS — I10 Essential (primary) hypertension: Secondary | ICD-10-CM | POA: Insufficient documentation

## 2020-08-24 LAB — ECHOCARDIOGRAM COMPLETE
AR max vel: 1.98 cm2
AV Area VTI: 1.58 cm2
AV Area mean vel: 1.83 cm2
AV Mean grad: 5 mmHg
AV Peak grad: 9.2 mmHg
Ao pk vel: 1.52 m/s
Area-P 1/2: 4.24 cm2
Calc EF: 58.3 %
S' Lateral: 3.07 cm
Single Plane A2C EF: 61.7 %
Single Plane A4C EF: 55.6 %

## 2020-08-24 NOTE — Progress Notes (Signed)
*  PRELIMINARY RESULTS* Echocardiogram 2D Echocardiogram has been performed.  Brady Sanchez 08/24/2020, 11:44 AM

## 2020-09-21 ENCOUNTER — Encounter: Payer: Self-pay | Admitting: Family Medicine

## 2020-09-21 ENCOUNTER — Other Ambulatory Visit: Payer: Self-pay

## 2020-09-21 ENCOUNTER — Telehealth: Payer: Self-pay

## 2020-09-21 ENCOUNTER — Ambulatory Visit (INDEPENDENT_AMBULATORY_CARE_PROVIDER_SITE_OTHER): Payer: Medicare Other | Admitting: Family Medicine

## 2020-09-21 VITALS — BP 154/74 | HR 59 | Temp 97.3°F | Resp 16 | Ht 69.0 in | Wt 228.0 lb

## 2020-09-21 DIAGNOSIS — E669 Obesity, unspecified: Secondary | ICD-10-CM

## 2020-09-21 DIAGNOSIS — I10 Essential (primary) hypertension: Secondary | ICD-10-CM

## 2020-09-21 DIAGNOSIS — E1169 Type 2 diabetes mellitus with other specified complication: Secondary | ICD-10-CM

## 2020-09-21 DIAGNOSIS — R011 Cardiac murmur, unspecified: Secondary | ICD-10-CM | POA: Diagnosis not present

## 2020-09-21 DIAGNOSIS — E66811 Obesity, class 1: Secondary | ICD-10-CM | POA: Insufficient documentation

## 2020-09-21 LAB — POCT GLYCOSYLATED HEMOGLOBIN (HGB A1C): Hemoglobin A1C: 7.2 % — AB (ref 4.0–5.6)

## 2020-09-21 NOTE — Patient Instructions (Addendum)
Thank you for coming to the office today.  Pam and Grayland Ormond will call you with more information on monitoring BP  Goal to lower sodium in diet  Recent Labs    12/19/19 0825 06/14/20 0820 09/21/20 0814  HGBA1C 6.8* 7.3* 7.2*   Recommend a yearly Diabetic Eye Exam - have them send [place] copy of report, last was 06/2019  Please schedule a Follow-up Appointment to: Return in about 3 months (around 12/22/2020) for 3 month DM A1c, HTN refer if uncontrol (CCM referred already).  If you have any other questions or concerns, please feel free to call the office or send a message through [place]. You may also schedule an earlier appointment if necessary.  Additionally, you may be receiving a survey about your experience at our office within a few days to 1 week by e-mail or mail. We value your feedback.  Nobie Putnam, DO [facility], [facility]   DASH Eating Plan DASH stands for "Dietary Approaches to Stop Hypertension." The DASH eating plan is a healthy eating plan that has been shown to reduce high blood pressure (hypertension). It may also reduce your risk for type 2 diabetes, heart disease, and stroke. The DASH eating plan may also help with weight loss. What are tips for following this plan?  General guidelines  Avoid eating more than 2,300 mg (milligrams) of salt (sodium) a day. If you have hypertension, you may need to reduce your sodium intake to 1,500 mg a day.  Limit alcohol intake to no more than 1 drink a day for nonpregnant women and 2 drinks a day for men. One drink equals 12 oz of beer, 5 oz of wine, or 1 oz of hard liquor.  Work with your health care provider to maintain a healthy body weight or to lose weight. Ask what an ideal weight is for you.  Get at least 30 minutes of exercise that causes your heart to beat faster (aerobic exercise) most days of the week. Activities may include walking, swimming, or biking.  Work with your health care provider or diet  and nutrition specialist (dietitian) to adjust your eating plan to your individual calorie needs. Reading food labels   Check food labels for the amount of sodium per serving. Choose foods with less than 5 percent of the Daily Value of sodium. Generally, foods with less than 300 mg of sodium per serving fit into this eating plan.  To find whole grains, look for the word "whole" as the first word in the ingredient list. Shopping  Buy products labeled as "low-sodium" or "no salt added."  Buy fresh foods. Avoid canned foods and premade or frozen meals. Cooking  Avoid adding salt when cooking. Use salt-free seasonings or herbs instead of table salt or sea salt. Check with your health care provider or pharmacist before using salt substitutes.  Do not fry foods. Cook foods using healthy methods such as baking, boiling, grilling, and broiling instead.  Cook with heart-healthy oils, such as olive, canola, soybean, or sunflower oil. Meal planning  Eat a balanced diet that includes: ? 5 or more servings of fruits and vegetables each day. At each meal, try to fill half of your plate with fruits and vegetables. ? Up to 6-8 servings of whole grains each day. ? Less than 6 oz of lean meat, poultry, or fish each day. A 3-oz serving of meat is about the same size as a deck of cards. One egg equals 1 oz. ? 2 servings of  low-fat dairy each day. ? A serving of nuts, seeds, or beans 5 times each week. ? Heart-healthy fats. Healthy fats called Omega-3 fatty acids are found in foods such as flaxseeds and coldwater fish, like sardines, salmon, and mackerel.  Limit how much you eat of the following: ? Canned or prepackaged foods. ? Food that is high in trans fat, such as fried foods. ? Food that is high in saturated fat, such as fatty meat. ? Sweets, desserts, sugary drinks, and other foods with added sugar. ? Full-fat dairy products.  Do not salt foods before eating.  Try to eat at least 2 vegetarian  meals each week.  Eat more home-cooked food and less restaurant, buffet, and fast food.  When eating at a restaurant, ask that your food be prepared with less salt or no salt, if possible. What foods are recommended? The items listed may not be a complete list. Talk with your dietitian about what dietary choices are best for you. Grains Whole-grain or whole-wheat bread. Whole-grain or whole-wheat pasta. Brown rice. [place]. Bulgur. Whole-grain and low-sodium cereals. Pita bread. Low-fat, low-sodium crackers. Whole-wheat flour tortillas. Vegetables Fresh or frozen vegetables (raw, steamed, roasted, or grilled). Low-sodium or reduced-sodium tomato and vegetable juice. Low-sodium or reduced-sodium tomato sauce and tomato paste. Low-sodium or reduced-sodium canned vegetables. Fruits All fresh, dried, or frozen fruit. Canned fruit in natural juice (without added sugar). Meat and other protein foods Skinless chicken or [place]. Ground chicken or [place]. Pork with fat trimmed off. Fish and seafood. Egg whites. Dried beans, peas, or lentils. Unsalted nuts, nut butters, and seeds. Unsalted canned beans. Lean cuts of beef with fat trimmed off. Low-sodium, lean deli meat. Dairy Low-fat (1%) or fat-free (skim) milk. Fat-free, low-fat, or reduced-fat cheeses. Nonfat, low-sodium ricotta or cottage cheese. Low-fat or nonfat yogurt. Low-fat, low-sodium cheese. Fats and oils Soft margarine without trans fats. Vegetable oil. Low-fat, reduced-fat, or light mayonnaise and salad dressings (reduced-sodium). Canola, safflower, olive, soybean, and sunflower oils. Avocado. Seasoning and other foods Herbs. Spices. Seasoning mixes without salt. Unsalted popcorn and pretzels. Fat-free sweets. What foods are not recommended? The items listed may not be a complete list. Talk with your dietitian about what dietary choices are best for you. Grains Baked goods made with fat, such as croissants, muffins, or some  breads. Dry pasta or rice meal packs. Vegetables Creamed or fried vegetables. Vegetables in a cheese sauce. Regular canned vegetables (not low-sodium or reduced-sodium). Regular canned tomato sauce and paste (not low-sodium or reduced-sodium). Regular tomato and vegetable juice (not low-sodium or reduced-sodium). Angie Fava. Olives. Fruits Canned fruit in a light or heavy syrup. Fried fruit. Fruit in cream or butter sauce. Meat and other protein foods Fatty cuts of meat. Ribs. Fried meat. Berniece Salines. Sausage. Bologna and other processed lunch meats. Salami. Fatback. Hotdogs. Bratwurst. Salted nuts and seeds. Canned beans with added salt. Canned or smoked fish. Whole eggs or egg yolks. Chicken or [place] with skin. Dairy Whole or 2% milk, cream, and half-and-half. Whole or full-fat cream cheese. Whole-fat or sweetened yogurt. Full-fat cheese. Nondairy creamers. Whipped toppings. Processed cheese and cheese spreads. Fats and oils Butter. Stick margarine. Lard. Shortening. Ghee. Bacon fat. Tropical oils, such as coconut, palm kernel, or palm oil. Seasoning and other foods Salted popcorn and pretzels. Onion salt, garlic salt, seasoned salt, table salt, and sea salt. Worcestershire sauce. Tartar sauce. Barbecue sauce. Teriyaki sauce. Soy sauce, including reduced-sodium. Steak sauce. Canned and packaged gravies. Fish sauce. Oyster sauce. Cocktail sauce. Horseradish that you  find on the shelf. Ketchup. Mustard. Meat flavorings and tenderizers. Bouillon cubes. Hot sauce and Tabasco sauce. Premade or packaged marinades. Premade or packaged taco seasonings. Relishes. Regular salad dressings. Where to find more information:  National Heart, Lung, and [place]: https://wilson-eaton.com/  American Heart Association: www.heart.org Summary  The DASH eating plan is a healthy eating plan that has been shown to reduce high blood pressure (hypertension). It may also reduce your risk for type 2 diabetes, heart disease, and  stroke.  With the DASH eating plan, you should limit salt (sodium) intake to 2,300 mg a day. If you have hypertension, you may need to reduce your sodium intake to 1,500 mg a day.  When on the DASH eating plan, aim to eat more fresh fruits and vegetables, whole grains, lean proteins, low-fat dairy, and heart-healthy fats.  Work with your health care provider or diet and nutrition specialist (dietitian) to adjust your eating plan to your individual calorie needs. This information is not intended to replace advice given to you by your health care provider. Make sure you discuss any questions you have with your health care provider. Document Revised: 10/12/2017 Document Reviewed: 10/23/2016 Elsevier Patient Education  2020 [employer].

## 2020-09-21 NOTE — Progress Notes (Signed)
Subjective:    Patient ID: Brady Sanchez, male    DOB: [birth year], 74 y.o.   MRN: [id number]  Brady Sanchez is a 74 y.o. male presenting on 09/21/2020 for Hypertension and Diabetes   HPI  CHRONIC DM, Type 2/ Obesity BMI >33 Due for A1c today No new concerns. Not checking sugar regularly. CBGs:none recently Meds:Metformin 1000mg  BID Reports good compliance. Tolerating well w/o side-effects Currently on ACEi Lifestyle: - Diet (low sugar low carb diet, trying to improve)  - Exercise (limited recently) S/p DM Eye Exam Dr Brady Sanchez 06/23/19. He is s/p cataract removal surgery 9/17 and 9/27 with improved vision able to read more, less blurry. Denies hypoglycemia, polyuria, visual changes, numbness or tingling.  CHRONIC HTN: Elevated SBP recently. He does not check BP regularly. Previous visits- increased HCTZ from 12.5 to 25 and also Lisinopril dose inc from 20 to 40mg  Today still not at goal BP - he admits high sodium diet He says no particular other reason for Metoprolol. No heart history, no tachycardia or MI. Current Meds -Lisinopril 40mg  daily, HCTZ25mg  daily, Metoprolol XL 50mg  daily Reports good compliance, took meds today. Tolerating well, w/o complaints. Denies CP, dyspnea, edema, dizziness / lightheadedness,HA  Heart Murmur Last visit identified mild systolic murmur, had ECHO done, negative result see below Denies symptoms  Health Maintenance: UTD COVID vaccine Due for Flu Shot, declines today despite counseling on benefits Declines PNA vaccine.  Depression screen [facility] 2/9 09/21/2020 06/18/2020 12/19/2019  Decreased Interest 0 0 0  Down, Depressed, Hopeless 0 0 0  PHQ - 2 Score 0 0 0  Altered sleeping - - -  Tired, decreased energy - - -  Change in appetite - - -  Feeling bad or failure about yourself  - - -  Trouble concentrating - - -  Moving slowly or fidgety/restless - - -  Suicidal thoughts - - -  PHQ-9 Score - - -    Social History   Tobacco  Use  . Smoking status: Never Smoker  . Smokeless tobacco: Never Used  Substance Use Topics  . Alcohol use: Yes  . Drug use: No    Review of Systems Per HPI unless specifically indicated above     Objective:    BP (!) 154/74 (BP Location: Left Arm, Cuff Size: Normal)   Pulse (!) 59   Temp (!) 97.3 F (36.3 C) (Temporal)   Resp 16   Ht 5\' 9"  (1.753 m)   Wt 228 lb (103.4 kg)   SpO2 99%   BMI 33.67 kg/m   Wt Readings from Last 3 Encounters:  09/21/20 228 lb (103.4 kg)  06/18/20 227 lb (103 kg)  12/19/19 228 lb (103.4 kg)    Physical Exam Vitals and nursing note reviewed.  Constitutional:      General: He is not in acute distress.    Appearance: He is well-developed. He is not diaphoretic.     Comments: Well-appearing, comfortable, cooperative  HENT:     Head: Normocephalic and atraumatic.  Eyes:     General:        Right eye: No discharge.        Left eye: No discharge.     Conjunctiva/sclera: Conjunctivae normal.  Neck:     Thyroid: No thyromegaly.  Cardiovascular:     Rate and Rhythm: Normal rate and regular rhythm.     Heart sounds: Normal heart sounds. No murmur heard.   Pulmonary:     Effort: Pulmonary effort is normal.  No respiratory distress.     Breath sounds: Normal breath sounds. No wheezing or rales.  Musculoskeletal:        General: Normal range of motion.     Cervical back: Normal range of motion and neck supple.  Lymphadenopathy:     Cervical: No cervical adenopathy.  Skin:    General: Skin is warm and dry.     Findings: No erythema or rash.  Neurological:     Mental Status: He is alert and oriented to person, place, and time.  Psychiatric:        Behavior: Behavior normal.     Comments: Well groomed, good eye contact, normal speech and thoughts      I have personally reviewed the radiology report from 08/24/20 ECHOCARDIOGRAM.  ECHOCARDIOGRAM REPORT      Patient Name:  Brady Sanchez Date of Exam: 08/24/2020  Medical Rec #:  [id number]     Height:    69.0 in  Accession #:  [id number]     Weight:    227.0 lb  Date of Birth: [birth year]     BSA:     2.180 m  Patient Age:  1 years      BP:      148/70 mmHg  Patient Gender: M         HR:      58 bpm.  Exam Location: [facility]   Procedure: 2D Echo, Color Doppler and Cardiac Doppler   Indications:   R01.1 Murmur    History:     Patient has no prior history of Echocardiogram examinations.          Risk Factors:Hypertension and Dyslipidemia.    Sonographer:   Charmayne Sheer RDCS (AE)  Referring Phys: [place]  Diagnosing Phys: Nelva Bush MD     Sonographer Comments: Suboptimal subcostal window.  IMPRESSIONS    1. Left ventricular ejection fraction, by estimation, is 55 to 60%. The left ventricle has normal function. The left ventricle has no regional wall motion abnormalities. Left ventricular diastolic parameters are consistent with Grade I diastolic  dysfunction (impaired relaxation).  2. Right ventricular systolic function is normal. The right ventricular size is normal. Mildly increased right ventricular wall thickness.  3. The mitral valve is normal in structure. Trivial mitral valve regurgitation. No evidence of mitral stenosis.  4. The aortic valve has an indeterminant number of cusps. Aortic valve regurgitation is not visualized. No aortic stenosis is present.  5. The inferior vena cava is normal in size with greater than 50% respiratory variability, suggesting right atrial pressure of 3 mmHg.   FINDINGS  Left Ventricle: Left ventricular ejection fraction, by estimation, is 55 to 60%. The left ventricle has normal function. The left ventricle has no regional wall motion abnormalities. The left ventricular internal cavity size was normal in size. There is  borderline left ventricular hypertrophy. Left ventricular diastolic parameters are consistent with Grade  I diastolic dysfunction (impaired relaxation).   Right Ventricle: The right ventricular size is normal. Mildly increased right ventricular wall thickness. Right ventricular systolic function is normal.   Left Atrium: Left atrial size was normal in size.   Right Atrium: Right atrial size was normal in size.   Pericardium: There is no evidence of pericardial effusion.   Mitral Valve: The mitral valve is normal in structure. Mild mitral annular calcification. Trivial mitral valve regurgitation. No evidence of mitral valve stenosis. MV peak gradient, 3.6 mmHg. The mean mitral valve gradient is 1.0 mmHg.  Tricuspid Valve: The tricuspid valve is normal in structure. Tricuspid valve regurgitation is trivial.   Aortic Valve: The aortic valve has an indeterminant number of cusps. Aortic valve regurgitation is not visualized. No aortic stenosis is present. Aortic valve mean gradient measures 5.0 mmHg. Aortic valve peak gradient measures 9.2 mmHg. Aortic valve  area, by VTI measures 1.58 cm.   Pulmonic Valve: The pulmonic valve was normal in structure. Pulmonic valve regurgitation is not visualized. No evidence of pulmonic stenosis.   Aorta: The aortic root is normal in size and structure.   Pulmonary Artery: The pulmonary artery is not well seen.   Venous: The inferior vena cava is normal in size with greater than 50% respiratory variability, suggesting right atrial pressure of 3 mmHg.   IAS/Shunts: The interatrial septum was not well visualized.     LEFT VENTRICLE  PLAX 2D  LVIDd:     4.81 cm   Diastology  LVIDs:     3.07 cm   LV e' medial:  5.77 cm/s  LV PW:     1.08 cm   LV E/e' medial: 13.1  LV IVS:    0.98 cm   LV e' lateral:  5.98 cm/s  LVOT diam:   2.10 cm   LV E/e' lateral: 12.6  LV SV:     54  LV SV Index:  25  LVOT Area:   3.46 cm    LV Volumes (MOD)  LV vol d, MOD A2C: 97.4 ml  LV vol d, MOD A4C: 99.1 ml  LV vol s, MOD A2C: 37.3  ml  LV vol s, MOD A4C: 44.0 ml  LV SV MOD A2C:   60.1 ml  LV SV MOD A4C:   99.1 ml  LV SV MOD BP:   59.8 ml   RIGHT VENTRICLE  RV Basal diam: 2.89 cm   LEFT ATRIUM       Index    RIGHT ATRIUM      Index  LA diam:    4.50 cm 2.06 cm/m RA Area:   17.10 cm  LA Vol (A2C):  49.8 ml 22.84 ml/m RA Volume:  46.30 ml 21.24 ml/m  LA Vol (A4C):  58.7 ml 26.92 ml/m  LA Biplane Vol: 56.5 ml 25.91 ml/m  AORTIC VALVE          PULMONIC VALVE  AV Area (Vmax):  1.98 cm   PV Vmax:    1.39 m/s  AV Area (Vmean):  1.83 cm   PV Vmean:   87.500 cm/s  AV Area (VTI):   1.58 cm   PV VTI:    0.315 m  AV Vmax:      152.00 cm/s PV Peak grad: 7.7 mmHg  AV Vmean:     110.000 cm/s PV Mean grad: 3.0 mmHg  AV VTI:      0.345 m  AV Peak Grad:   9.2 mmHg  AV Mean Grad:   5.0 mmHg  LVOT Vmax:     86.80 cm/s  LVOT Vmean:    58.100 cm/s  LVOT VTI:     0.157 m  LVOT/AV VTI ratio: 0.46    AORTA  Ao Root diam: 3.00 cm   MITRAL VALVE  MV Area (PHT): 4.24 cm  SHUNTS  MV Peak grad: 3.6 mmHg  Systemic VTI: 0.16 m  MV Mean grad: 1.0 mmHg  Systemic Diam: 2.10 cm  MV Vmax:    0.95 m/s  MV Vmean:   58.1 cm/s  MV Decel Time: 179  msec  MV E velocity: 75.40 cm/s  MV A velocity: 93.00 cm/s  MV E/A ratio: 0.81   Christopher End MD  Electronically signed by Nelva Bush MD  Signature Date/Time: 08/24/2020/3:09:12 PM   Results for orders placed or performed in visit on 09/21/20  POCT HgB A1C  Result Value Ref Range   Hemoglobin A1C 7.2 (A) 4.0 - 5.6 %   Recent Labs    12/19/19 0825 06/14/20 0820 09/21/20 0814  HGBA1C 6.8* 7.3* 7.2*       Assessment & Plan:   Problem List Items Addressed This Visit    Type 2 diabetes mellitus with other specified complication ([place]) - Primary    Stable controlled for age, A1c 7.2 No hypoglycemia or hyperglycemia Complications - hyperlipidemia,  obesity, hypothyroidism, - increases risk of future cardiovascular complications   Plan:  1. Continue current therapy - Metformin 1000mg  BID 2. Encourage improved lifestyle - low carb, low sugar diet, reduce portion size, continue improving regular exercise 3. Check CBG , bring log to next visit for review 4. Continue ACEi, Statin  Need updated DM eye exam Dr Brady Sanchez      Relevant Orders   POCT HgB A1C (Completed)   Ambulatory referral to Chronic Care Management Services   Obesity (BMI 30.0-34.9)    Encourage diet lifestyle exercise      Essential hypertension    Again elevated BP - improve on re-check, still seems above goal - Home BP readings none available  No known complications    Plan:  1. Continue Lisinopril 40mg  daily, HCTZ 25mg  daily, Metoprolol XL 50mg  daily - Consider CCB Amlodipine in future 2. Encourage improved lifestyle - emphasized importance of low sodium diet, regular exercise 3. Keep monitor BP outside office, bring readings to next visit, if persistently >140/90 or new symptoms notify office sooner  He will check BP at work and notify us if need  Referral to CCM services for monitoring BP at home may need cuff and improve diet      Relevant Orders   Ambulatory referral to Chronic Care Management Services    Other Visit Diagnoses    Heart murmur         Heart murmur stable, mild ECHO reassuring   Orders Placed This Encounter  Procedures  . Ambulatory referral to Chronic Care Management Services    Referral Priority:   Routine    Referral Type:   Consultation    Referral Reason:   Care Coordination    Number of Visits Requested:   1  . POCT HgB A1C     No orders of the defined types were placed in this encounter.     Follow up plan: Return in about 3 months (around 12/22/2020) for 3 month DM A1c, HTN refer if uncontrol (CCM referred already).   Nobie Putnam, [facility] 09/21/2020,  8:26 AM

## 2020-09-21 NOTE — Assessment & Plan Note (Addendum)
Stable controlled for age, A1c 7.2 No hypoglycemia or hyperglycemia Complications - hyperlipidemia, obesity, hypothyroidism, - increases risk of future cardiovascular complications   Plan:  1. Continue current therapy - Metformin 1000mg  BID 2. Encourage improved lifestyle - low carb, low sugar diet, reduce portion size, continue improving regular exercise 3. Check CBG , bring log to next visit for review 4. Continue ACEi, Statin  Need updated DM eye exam Dr Matilde Sprang

## 2020-09-21 NOTE — Assessment & Plan Note (Signed)
Encourage diet lifestyle exercise

## 2020-09-21 NOTE — Chronic Care Management (AMB) (Signed)
  Chronic Care Management   Outreach Note  09/21/2020 Name: Brady Sanchez MRN: [id number] DOB: 1946-10-02  Brady Sanchez is a 74 y.o. year old male who is a primary care patient of Brady Hauser, Brady Sanchez. I reached out to Brady Sanchez by phone today in response to a referral sent by Brady Sanchez PCP, Brady Sanchez     An unsuccessful telephone outreach was attempted today. The patient was referred to the case management team for assistance with care management and care coordination.   Follow Up Plan: A HIPAA compliant phone message was left for the patient providing contact information and requesting a return call.  The care management team will reach out to the patient again over the next 7 days.  If patient returns call to provider office, please advise to call Brady Sanchez  at [place], [place], [place], [place] [address] Direct Dial: [phone] [email or web address] Website: [email or web address]

## 2020-09-21 NOTE — Assessment & Plan Note (Signed)
Again elevated BP - improve on re-check, still seems above goal - Home BP readings none available  No known complications    Plan:  1. Continue Lisinopril 40mg  daily, HCTZ 25mg  daily, Metoprolol XL 50mg  daily - Consider CCB Amlodipine in future 2. Encourage improved lifestyle - emphasized importance of low sodium diet, regular exercise 3. Keep monitor BP outside office, bring readings to next visit, if persistently >140/90 or new symptoms notify office sooner  He will check BP at work and notify us if need  Referral to [facility] services for monitoring BP at home may need cuff and improve diet

## 2020-09-28 NOTE — Chronic Care Management (AMB) (Signed)
  Chronic Care Management   Outreach Note  09/28/2020 Name: Brady Sanchez MRN: [id number] DOB: Aug 20, 1946  Brady Sanchez is a 74 y.o. year old male who is a primary care patient of Brady Hauser, DO. I reached out to Brady Sanchez by phone today in response to a referral sent by Brady Sanchez PCP, Dr. Parks Ranger     A second unsuccessful telephone outreach was attempted today. The patient was referred to the case management team for assistance with care management and care coordination.   Follow Up Plan: A HIPAA compliant phone message was left for the patient providing contact information and requesting a return call.  The care management team will reach out to the patient again over the next 7 days.  If patient returns call to provider office, please advise to call [place] at [place], [place], [place], [place] [address] Direct Dial: [phone] [email or web address]

## 2020-09-30 ENCOUNTER — Other Ambulatory Visit: Payer: Self-pay | Admitting: Family Medicine

## 2020-10-06 NOTE — Chronic Care Management (AMB) (Signed)
  Chronic Care Management   Note  10/06/2020 Name: Brady Sanchez MRN: [id number] DOB: 11-29-1945  Brady Sanchez is a 74 y.o. year old male who is a primary care patient of Olin Hauser, DO. I reached out to Lucrezia Europe by phone today in response to a referral sent by [name] PCP, Dr. Parks Ranger     [name] was given information about Chronic Care Management services today including:  1. CCM service includes personalized support from designated clinical staff supervised by his physician, including individualized plan of care and coordination with other care providers 2. 24/7 contact phone numbers for assistance for urgent and routine care needs. 3. Service will only be billed when office clinical staff spend 20 minutes or more in a month to coordinate care. 4. Only one practitioner may furnish and bill the service in a calendar month. 5. The patient may stop CCM services at any time (effective at the end of the month) by phone call to the office staff. 6. The patient will be responsible for cost sharing (co-pay) of up to 20% of the service fee (after annual deductible is met).  Patient did not agree to enrollment in care management services and does not wish to consider at this time.  Follow up plan: Patient declines further follow up and engagement by the care management team. Appropriate care team members and provider have been notified via electronic communication.   Noreene Larsson, [place], [place], [place] [address] Direct Dial: [phone] [email or web address]

## 2020-11-09 ENCOUNTER — Other Ambulatory Visit: Payer: Self-pay | Admitting: Family Medicine

## 2020-12-11 ENCOUNTER — Other Ambulatory Visit: Payer: Self-pay | Admitting: Family Medicine

## 2020-12-12 NOTE — Telephone Encounter (Signed)
Requested Prescriptions  Pending Prescriptions Disp Refills  . triamcinolone cream (KENALOG) 0.5 % [Pharmacy Med Name: TRIAMCINOLONE 0.5% CREAM] 15 g 0    Sig: APPLY TOPICALLY TO [place]     Dermatology:  Corticosteroids Passed - 12/11/2020 12:26 PM      Passed - Valid encounter within last 12 months    Recent Outpatient Visits          2 months ago Type 2 diabetes mellitus with other specified complication, without long-term current use of insulin [facility])   [place], DO   5 months ago Essential hypertension   [facility] Olin Hauser, DO   11 months ago COVID-19 virus infection   [facility] [place], [place]   11 months ago COVID-19 virus infection   [place], DO   11 months ago Type 2 diabetes mellitus with other specified complication, without long-term current use of insulin [facility])   [facility] Parks Ranger, Devonne Doughty, DO      Future Appointments            In 2 weeks Parks Ranger, Devonne Doughty, DO [facility], [facility]

## 2020-12-15 ENCOUNTER — Other Ambulatory Visit: Payer: Self-pay | Admitting: Family Medicine

## 2020-12-15 DIAGNOSIS — I10 Essential (primary) hypertension: Secondary | ICD-10-CM

## 2020-12-28 ENCOUNTER — Ambulatory Visit: Payer: Medicare Other | Admitting: Family Medicine

## 2021-01-09 ENCOUNTER — Other Ambulatory Visit: Payer: Self-pay | Admitting: Family Medicine

## 2021-01-09 NOTE — Telephone Encounter (Signed)
Requested Prescriptions  Pending Prescriptions Disp Refills  . triamcinolone cream (KENALOG) 0.5 % [Pharmacy Med Name: TRIAMCINOLONE 0.5% CREAM] 15 g 0    Sig: APPLY TOPICALLY TO [place]     Dermatology:  Corticosteroids Passed - 01/09/2021 12:44 PM      Passed - Valid encounter within last 12 months    Recent Outpatient Visits          3 months ago Type 2 diabetes mellitus with other specified complication, without long-term current use of insulin [facility])   [place], DO   6 months ago Essential hypertension   [facility] Olin Hauser, DO   1 year ago COVID-19 virus infection   [facility], [place], [place]   1 year ago COVID-19 virus infection   [facility], Devonne Doughty, DO   1 year ago Type 2 diabetes mellitus with other specified complication, without long-term current use of insulin ([place])   [facility] Parks Ranger, Devonne Doughty, DO      Future Appointments            In 2 weeks Parks Ranger, Devonne Doughty, DO [facility], [facility]

## 2021-01-14 ENCOUNTER — Other Ambulatory Visit: Payer: Self-pay | Admitting: Family Medicine

## 2021-01-14 DIAGNOSIS — E785 Hyperlipidemia, unspecified: Secondary | ICD-10-CM

## 2021-01-14 DIAGNOSIS — I10 Essential (primary) hypertension: Secondary | ICD-10-CM

## 2021-01-25 ENCOUNTER — Other Ambulatory Visit: Payer: Self-pay

## 2021-01-25 ENCOUNTER — Encounter: Payer: Self-pay | Admitting: Family Medicine

## 2021-01-25 ENCOUNTER — Ambulatory Visit (INDEPENDENT_AMBULATORY_CARE_PROVIDER_SITE_OTHER): Payer: Medicare Other | Admitting: Family Medicine

## 2021-01-25 VITALS — BP 174/67 | HR 60 | Ht 68.0 in | Wt 226.4 lb

## 2021-01-25 DIAGNOSIS — I10 Essential (primary) hypertension: Secondary | ICD-10-CM | POA: Diagnosis not present

## 2021-01-25 DIAGNOSIS — E669 Obesity, unspecified: Secondary | ICD-10-CM

## 2021-01-25 DIAGNOSIS — E1169 Type 2 diabetes mellitus with other specified complication: Secondary | ICD-10-CM | POA: Diagnosis not present

## 2021-01-25 LAB — POCT GLYCOSYLATED HEMOGLOBIN (HGB A1C): Hemoglobin A1C: 7 % — AB (ref 4.0–5.6)

## 2021-01-25 MED ORDER — AMLODIPINE BESYLATE 10 MG PO TABS: 10.0000 mg | ORAL_TABLET | Freq: Every day | ORAL | 5 refills | Status: DC

## 2021-01-25 NOTE — Assessment & Plan Note (Signed)
Stable controlled for age, A1c 7.0 improved No hypoglycemia or hyperglycemia Complications - hyperlipidemia, obesity, hypothyroidism, - increases risk of future cardiovascular complications   Plan:  1. Continue current therapy - Metformin 1000mg  BID 2. Encourage improved lifestyle - low carb, low sugar diet, reduce portion size, continue improving regular exercise 3. Check CBG , bring log to next visit for review 4. Continue ACEi, Statin  Need updated DM eye exam Dr Matilde Sprang - he will work on scheduling, had issue with loss of job and lack of supplemental insurance.

## 2021-01-25 NOTE — Progress Notes (Signed)
Subjective:    Patient ID: Brady Sanchez, male    DOB: 05/03/1946, 75 y.o.   MRN: [id number]  Brady Sanchez is a 75 y.o. male presenting on 01/25/2021 for Diabetes and Hypertension   HPI   CHRONIC DM, Type 2/ Obesity BMI >33 Due for A1c today. Previous A1c 7.3 to 7.2 No new concerns. Not checking sugar regularly. CBGs:none recently Meds:Metformin 1000mg  BID Reports good compliance. Tolerating well w/o side-effects Currently on ACEi Lifestyle: - Diet (low sugar low carb diet, trying to improve)  - Exercise (limited recently) S/p DM Eye Exam Dr Matilde Sprang 06/23/19. He is s/p cataract removal surgery 9/17 and 9/27 with improved vision able to read more, less blurry. He will work on returning to [employer] for DM eye exam, he changed jobs and issue with insurance coverage. Denies hypoglycemia, polyuria, visual changes, numbness or tingling.  CHRONIC HTN: Elevated SBP still. Prior readings in office 150-170 at times, some improved on recheck. He cannot check BP outside of office. No access to cuff Previous visits- increased HCTZ from 12.5 to 25 and also Lisinopril dose inc from 20 to 40mg  He has picture of his medications shows older rx Lisinopril 20 to 40mg .  Today still not at goal BP - he admits high sodium diet, improving this. He says no particular other reason for Metoprolol. No heart history, no tachycardia or MI. Current Meds -Lisinopril 40mg  daily, HCTZ25mg  daily, Metoprolol XL 50mg  daily Reports good compliance, took meds today. Tolerating well, w/o complaints. Denies CP, dyspnea, edema, dizziness / lightheadedness,HA   Depression screen [facility] 2/9 09/21/2020 06/18/2020 12/19/2019  Decreased Interest 0 0 0  Down, Depressed, Hopeless 0 0 0  PHQ - 2 Score 0 0 0  Altered sleeping - - -  Tired, decreased energy - - -  Change in appetite - - -  Feeling bad or failure about yourself  - - -  Trouble concentrating - - -  Moving slowly or fidgety/restless - - -  Suicidal  thoughts - - -  PHQ-9 Score - - -    Social History   Tobacco Use  . Smoking status: Never Smoker  . Smokeless tobacco: Never Used  Substance Use Topics  . Alcohol use: Yes  . Drug use: No    Review of Systems Per HPI unless specifically indicated above     Objective:    BP (!) 174/67   Pulse 60   Ht 5\' 8"  (1.727 m)   Wt 226 lb 6.4 oz (102.7 kg)   SpO2 100%   BMI 34.42 kg/m   Wt Readings from Last 3 Encounters:  01/25/21 226 lb 6.4 oz (102.7 kg)  09/21/20 228 lb (103.4 kg)  06/18/20 227 lb (103 kg)    Physical Exam Vitals and nursing note reviewed.  Constitutional:      General: He is not in acute distress.    Appearance: He is well-developed. He is not diaphoretic.     Comments: Well-appearing, comfortable, cooperative  HENT:     Head: Normocephalic and atraumatic.  Eyes:     General:        Right eye: No discharge.        Left eye: No discharge.     Conjunctiva/sclera: Conjunctivae normal.  Neck:     Thyroid: No thyromegaly.  Cardiovascular:     Rate and Rhythm: Normal rate and regular rhythm.     Heart sounds: Normal heart sounds. No murmur heard.   Pulmonary:     Effort:  Pulmonary effort is normal. No respiratory distress.     Breath sounds: Normal breath sounds. No wheezing or rales.  Musculoskeletal:        General: Normal range of motion.     Cervical back: Normal range of motion and neck supple.  Lymphadenopathy:     Cervical: No cervical adenopathy.  Skin:    General: Skin is warm and dry.     Findings: No erythema or rash.  Neurological:     Mental Status: He is alert and oriented to person, place, and time.  Psychiatric:        Behavior: Behavior normal.     Comments: Well groomed, good eye contact, normal speech and thoughts      Diabetic Foot Exam - Simple   Simple Foot Form Diabetic Foot exam was performed with the following findings: Yes 01/25/2021 10:09 AM  Visual Inspection No deformities, no ulcerations, no other skin  breakdown bilaterally: Yes Sensation Testing Intact to touch and monofilament testing bilaterally: Yes Pulse Check Posterior Tibialis and Dorsalis pulse intact bilaterally: Yes Comments Mild callus formation. No ulceration. Intact monofilament.     Results for orders placed or performed in visit on 01/25/21  POCT HgB A1C  Result Value Ref Range   Hemoglobin A1C 7.0 (A) 4.0 - 5.6 %   Recent Labs    06/14/20 0820 09/21/20 0814 01/25/21 1000  HGBA1C 7.3* 7.2* 7.0*       Assessment & Plan:   Problem List Items Addressed This Visit    Type 2 diabetes mellitus with other specified complication ([place]) - Primary    Stable controlled for age, A1c 7.0 improved No hypoglycemia or hyperglycemia Complications - hyperlipidemia, obesity, hypothyroidism, - increases risk of future cardiovascular complications   Plan:  1. Continue current therapy - Metformin 1000mg  BID 2. Encourage improved lifestyle - low carb, low sugar diet, reduce portion size, continue improving regular exercise 3. Check CBG , bring log to next visit for review 4. Continue ACEi, Statin  Need updated DM eye exam Dr Matilde Sprang - he will work on scheduling, had issue with loss of job and lack of supplemental insurance.      Relevant Orders   POCT HgB A1C (Completed)   Obesity (BMI 30.0-34.9)   Essential hypertension    Again elevated BP - Home BP readings none available  No known complications    Plan:  1. ADD new med Amlodipine 10mg  daily new CCB 2. Continue Lisinopril 40mg  daily, HCTZ 25mg  daily, Metoprolol XL 50mg  daily 3. Encourage improved lifestyle - emphasized importance of low sodium diet, regular exercise 4. Keep monitor BP outside office, bring readings to next visit, if persistently >140/90 or new symptoms notify office sooner  He will check BP at work and notify us if need, while on new med.  He has declined CCM services initially from last referral, reconsider      Relevant Medications    amLODipine (NORVASC) 10 MG tablet       Meds ordered this encounter  Medications  . amLODipine (NORVASC) 10 MG tablet    Sig: Take 1 tablet (10 mg total) by mouth daily.    Dispense:  30 tablet    Refill:  5     Follow up plan: Return in about 5 months (around 06/27/2021) for 5 month AM apt for Yearly Medicare Checkup (fasting lab AFTER).   Nobie Putnam, [facility] 01/25/2021, 9:58 AM

## 2021-01-25 NOTE — Assessment & Plan Note (Signed)
Again elevated BP - Home BP readings none available  No known complications    Plan:  1. ADD new med Amlodipine 10mg  daily new CCB 2. Continue Lisinopril 40mg  daily, HCTZ 25mg  daily, Metoprolol XL 50mg  daily 3. Encourage improved lifestyle - emphasized importance of low sodium diet, regular exercise 4. Keep monitor BP outside office, bring readings to next visit, if persistently >140/90 or new symptoms notify office sooner  He will check BP at work and notify us if need, while on new med.  He has declined CCM services initially from last referral, reconsider

## 2021-01-25 NOTE — Patient Instructions (Addendum)
Thank you for coming to the office today.  Start new BP medication - Amlodipine 10mg  daily.  Try to get Diabetic Eye.  Keep up the good work on blood sugar  Recent Labs    06/14/20 0820 09/21/20 0814 01/25/21 1000  HGBA1C 7.3* 7.2* 7.0*    Let me know if need anything else before next apt  Please schedule a Follow-up Appointment to: Return in about 5 months (around 06/27/2021) for 5 month AM apt for Yearly Medicare Checkup (fasting lab AFTER).  If you have any other questions or concerns, please feel free to call the office or send a message through [place]. You may also schedule an earlier appointment if necessary.  Additionally, you may be receiving a survey about your experience at our office within a few days to 1 week by e-mail or mail. We value your feedback.  Nobie Putnam, DO [place]

## 2021-06-22 ENCOUNTER — Other Ambulatory Visit: Payer: Self-pay | Admitting: Family Medicine

## 2021-06-22 DIAGNOSIS — E1169 Type 2 diabetes mellitus with other specified complication: Secondary | ICD-10-CM

## 2021-06-22 NOTE — Telephone Encounter (Signed)
Requested medications are due for refill today.  yes  Requested medications are on the active medications list.  yes  Last refill. 06/18/2020  Future visit scheduled.   yes  Notes to clinic.  Prescription is expired.

## 2021-06-29 ENCOUNTER — Encounter: Payer: Medicare Other | Admitting: Family Medicine

## 2021-07-04 ENCOUNTER — Other Ambulatory Visit: Payer: Self-pay | Admitting: Family Medicine

## 2021-07-04 DIAGNOSIS — I10 Essential (primary) hypertension: Secondary | ICD-10-CM

## 2021-07-04 NOTE — Telephone Encounter (Signed)
Notes to clinic:  Patient has upcoming appt on 09/13/2021 Review for refill until that time    Requested Prescriptions  Pending Prescriptions Disp Refills   hydrochlorothiazide (HYDRODIURIL) 25 MG tablet [Pharmacy Med Name: HYDROCHLOROTHIAZIDE 25 MG TAB] 90 tablet 1    Sig: TAKE 1 TABLET BY MOUTH EVERY DAY     Cardiovascular: Diuretics - Thiazide Failed - 07/04/2021  1:35 AM      Failed - Ca in normal range and within 360 days    Calcium  Date Value Ref Range Status  06/14/2020 9.2 8.6 - 10.3 mg/dL Final   Calcium, Total  Date Value Ref Range Status  01/30/2012 8.8 8.5 - 10.1 mg/dL Final          Failed - Cr in normal range and within 360 days    Creat  Date Value Ref Range Status  06/14/2020 1.03 0.70 - 1.18 mg/dL Final    Comment:    For patients >6 years of age, the reference limit for Creatinine is approximately 13% higher for people identified as African-American. .           Failed - K in normal range and within 360 days    Potassium  Date Value Ref Range Status  06/14/2020 4.0 3.5 - 5.3 mmol/L Final  01/30/2012 3.6 3.5 - 5.1 mmol/L Final          Failed - Na in normal range and within 360 days    Sodium  Date Value Ref Range Status  06/14/2020 136 135 - 146 mmol/L Final  01/30/2012 141 136 - 145 mmol/L Final          Failed - Last BP in normal range    BP Readings from Last 1 Encounters:  01/25/21 (!) 174/67          Passed - Valid encounter within last 6 months    Recent Outpatient Visits           5 months ago Type 2 diabetes mellitus with other specified complication, without long-term current use of insulin ([place])   [facility], Devonne Doughty, DO   9 months ago Type 2 diabetes mellitus with other specified complication, without long-term current use of insulin ([place])   [facility] Olin Hauser, DO   1 year ago Essential hypertension   [facility] Olin Hauser,  DO   1 year ago COVID-72 virus infection   [facility], [place], [place]   1 year ago COVID-19 virus infection   [place], DO       Future Appointments             In 3 weeks  [facility], [place]   In 2 months [place], DO [facility], [facility]             lisinopril (ZESTRIL) 40 MG tablet [Pharmacy Med Name: LISINOPRIL 40 MG TABLET] 90 tablet 1    Sig: TAKE 1 TABLET BY MOUTH EVERY DAY     Cardiovascular:  ACE Inhibitors Failed - 07/04/2021  1:35 AM      Failed - Cr in normal range and within 180 days    Creat  Date Value Ref Range Status  06/14/2020 1.03 0.70 - 1.18 mg/dL Final    Comment:    For patients >35 years of age, the reference limit for Creatinine is approximately 13% higher for  people identified as African-American. .           Failed - K in normal range and within 180 days    Potassium  Date Value Ref Range Status  06/14/2020 4.0 3.5 - 5.3 mmol/L Final  01/30/2012 3.6 3.5 - 5.1 mmol/L Final          Failed - Last BP in normal range    BP Readings from Last 1 Encounters:  01/25/21 (!) 174/67          Passed - Patient is not pregnant      Passed - Valid encounter within last 6 months    Recent Outpatient Visits           5 months ago Type 2 diabetes mellitus with other specified complication, without long-term current use of insulin ([place])   [facility], Devonne Doughty, DO   9 months ago Type 2 diabetes mellitus with other specified complication, without long-term current use of insulin ([place])   [facility] Olin Hauser, DO   1 year ago Essential hypertension   [facility] Olin Hauser, DO   1 year ago COVID-19 virus infection   [facility], [place], [place]   1 year ago COVID-19 virus infection   [place], Devonne Doughty, DO       Future Appointments             In 3 weeks  [facility], [place]   In 2 months Parks Ranger, Devonne Doughty, [facility], [facility]

## 2021-07-21 ENCOUNTER — Ambulatory Visit: Payer: Self-pay

## 2021-07-21 NOTE — Telephone Encounter (Signed)
Patient called and says since around 1230-1300 he has been having numbness to his right ring finger first joint to his finger tip and the left hand feeling tightness. He says it started after eating. He says the numbness is just to that joint and has not moved. He says he is able to hold his cell phone with his left hand without weakness. He says there has not been tingling, itching to his throat, tongue, no swelling noted. He denies any other symptoms. Advised home care advice, advised to monitor for stroke symptoms, advised to go to the ED if symptoms worsen or not improving, patient verbalized understanding.  Reason for Disposition  [1] Numbness or tingling on both sides of body AND [2] is a new symptom present < 24 hours  Answer Assessment - Initial Assessment Questions 1. SYMPTOM: "What is the main symptom you are concerned about?" (e.g., weakness, numbness)     Right ring finger joint numbness and left hand feels tight 2. ONSET: "When did this start?" (minutes, hours, days; while sleeping)     About 1230-1300 after eating 3. LAST NORMAL: "When was the last time you (the patient) were normal (no symptoms)?"     Prior to 1230 today 4. PATTERN "Does this come and go, or has it been constant since it started?"  "Is it present now?"     Constant since it started and present now 5. CARDIAC SYMPTOMS: "Have you had any of the following symptoms: chest pain, difficulty breathing, palpitations?"     No 6. NEUROLOGIC SYMPTOMS: "Have you had any of the following symptoms: headache, dizziness, vision loss, double vision, changes in speech, unsteady on your feet?"     No 7. OTHER SYMPTOMS: "Do you have any other symptoms?"      No 8. PREGNANCY: "Is there any chance you are pregnant?" "When was your last menstrual period?"     N/A  Protocols used: Neurologic Deficit-A-AH

## 2021-07-22 ENCOUNTER — Other Ambulatory Visit: Payer: Self-pay | Admitting: Family Medicine

## 2021-07-22 DIAGNOSIS — I10 Essential (primary) hypertension: Secondary | ICD-10-CM

## 2021-07-26 ENCOUNTER — Ambulatory Visit (INDEPENDENT_AMBULATORY_CARE_PROVIDER_SITE_OTHER): Payer: Medicare Other

## 2021-07-26 VITALS — Ht 69.0 in | Wt 240.0 lb

## 2021-07-26 DIAGNOSIS — Z0000 Encounter for general adult medical examination without abnormal findings: Secondary | ICD-10-CM

## 2021-07-26 NOTE — Patient Instructions (Signed)
Brady Sanchez , Thank you for taking time to come for your Medicare Wellness Visit. I appreciate your ongoing commitment to your health goals. Please review the following plan we discussed and let me know if I can assist you in the future.   Screening recommendations/referrals: Colonoscopy: cologuard 05/02/2019, due 05/01/2022 Recommended yearly ophthalmology/optometry visit for glaucoma screening and checkup Recommended yearly dental visit for hygiene and checkup  Vaccinations: Influenza vaccine: decline Pneumococcal vaccine: decline Tdap vaccine: decline Shingles vaccine: decline   Covid-19:  01/19/2020, 12/22/2019  Advanced directives: Advance directive discussed with you today. .  Conditions/risks identified: none  Next appointment: Follow up in one year for your annual wellness visit.   Preventive Care 12 Years and Older, Male Preventive care refers to lifestyle choices and visits with your health care provider that can promote health and wellness. What does preventive care include? A yearly physical exam. This is also called an annual well check. Dental exams once or twice a year. Routine eye exams. Ask your health care provider how often you should have your eyes checked. Personal lifestyle choices, including: Daily care of your teeth and gums. Regular physical activity. Eating a healthy diet. Avoiding tobacco and drug use. Limiting alcohol use. Practicing safe sex. Taking low doses of aspirin every day. Taking vitamin and mineral supplements as recommended by your health care provider. What happens during an annual well check? The services and screenings done by your health care provider during your annual well check will depend on your age, overall health, lifestyle risk factors, and family history of disease. Counseling  Your health care provider may ask you questions about your: Alcohol use. Tobacco use. Drug use. Emotional well-being. Home and relationship  well-being. Sexual activity. Eating habits. History of falls. Memory and ability to understand (cognition). Work and work Statistician. Screening  You may have the following tests or measurements: Height, weight, and BMI. Blood pressure. Lipid and cholesterol levels. These may be checked every 5 years, or more frequently if you are over 72 years old. Skin check. Lung cancer screening. You may have this screening every year starting at age 40 if you have a 30-pack-year history of smoking and currently smoke or have quit within the past 15 years. Fecal occult blood test (FOBT) of the stool. You may have this test every year starting at age 60. Flexible sigmoidoscopy or colonoscopy. You may have a sigmoidoscopy every 5 years or a colonoscopy every 10 years starting at age 4. Prostate cancer screening. Recommendations will vary depending on your family history and other risks. Hepatitis C blood test. Hepatitis B blood test. Sexually transmitted disease (STD) testing. Diabetes screening. This is done by checking your blood sugar (glucose) after you have not eaten for a while (fasting). You may have this done every 1-3 years. Abdominal aortic aneurysm (AAA) screening. You may need this if you are a current or former smoker. Osteoporosis. You may be screened starting at age 76 if you are at high risk. Talk with your health care provider about your test results, treatment options, and if necessary, the need for more tests. Vaccines  Your health care provider may recommend certain vaccines, such as: Influenza vaccine. This is recommended every year. Tetanus, diphtheria, and acellular pertussis (Tdap, Td) vaccine. You may need a Td booster every 10 years. Zoster vaccine. You may need this after age 27. Pneumococcal 13-valent conjugate (PCV13) vaccine. One dose is recommended after age 6. Pneumococcal polysaccharide (PPSV23) vaccine. One dose is recommended after age 51. Talk  to your health care  provider about which screenings and vaccines you need and how often you need them. This information is not intended to replace advice given to you by your health care provider. Make sure you discuss any questions you have with your health care provider. Document Released: 11/26/2015 Document Revised: 07/19/2016 Document Reviewed: 08/31/2015 Elsevier Interactive Patient Education  2017 [place] Prevention in the Home Falls can cause injuries. They can happen to people of all ages. There are many things you can do to make your home safe and to help prevent falls. What can I do on the outside of my home? Regularly fix the edges of walkways and driveways and fix any cracks. Remove anything that might make you trip as you walk through a door, such as a raised step or threshold. Trim any bushes or trees on the path to your home. Use bright outdoor lighting. Clear any walking paths of anything that might make someone trip, such as rocks or tools. Regularly check to see if handrails are loose or broken. Make sure that both sides of any steps have handrails. Any raised decks and porches should have guardrails on the edges. Have any leaves, snow, or ice cleared regularly. Use sand or salt on walking paths during winter. Clean up any spills in your garage right away. This includes oil or grease spills. What can I do in the bathroom? Use night lights. Install grab bars by the toilet and in the tub and shower. Do not use towel bars as grab bars. Use non-skid mats or decals in the tub or shower. If you need to sit down in the shower, use a plastic, non-slip stool. Keep the floor dry. Clean up any water that spills on the floor as soon as it happens. Remove soap buildup in the tub or shower regularly. Attach bath mats securely with double-sided non-slip rug tape. Do not have throw rugs and other things on the floor that can make you trip. What can I do in the bedroom? Use night lights. Make  sure that you have a light by your bed that is easy to reach. Do not use any sheets or blankets that are too big for your bed. They should not hang down onto the floor. Have a firm chair that has side arms. You can use this for support while you get dressed. Do not have throw rugs and other things on the floor that can make you trip. What can I do in the kitchen? Clean up any spills right away. Avoid walking on wet floors. Keep items that you use a lot in easy-to-reach places. If you need to reach something above you, use a strong step stool that has a grab bar. Keep electrical cords out of the way. Do not use floor polish or wax that makes floors slippery. If you must use wax, use non-skid floor wax. Do not have throw rugs and other things on the floor that can make you trip. What can I do with my stairs? Do not leave any items on the stairs. Make sure that there are handrails on both sides of the stairs and use them. Fix handrails that are broken or loose. Make sure that handrails are as long as the stairways. Check any carpeting to make sure that it is firmly attached to the stairs. Fix any carpet that is loose or worn. Avoid having throw rugs at the top or bottom of the stairs. If you do have throw rugs,  attach them to the floor with carpet tape. Make sure that you have a light switch at the top of the stairs and the bottom of the stairs. If you do not have them, ask someone to add them for you. What else can I do to help prevent falls? Wear shoes that: Do not have high heels. Have rubber bottoms. Are comfortable and fit you well. Are closed at the toe. Do not wear sandals. If you use a stepladder: Make sure that it is fully opened. Do not climb a closed stepladder. Make sure that both sides of the stepladder are locked into place. Ask someone to hold it for you, if possible. Clearly mark and make sure that you can see: Any grab bars or handrails. First and last steps. Where the  edge of each step is. Use tools that help you move around (mobility aids) if they are needed. These include: Canes. Walkers. Scooters. Crutches. Turn on the lights when you go into a dark area. Replace any light bulbs as soon as they burn out. Set up your furniture so you have a clear path. Avoid moving your furniture around. If any of your floors are uneven, fix them. If there are any pets around you, be aware of where they are. Review your medicines with your doctor. Some medicines can make you feel dizzy. This can increase your chance of falling. Ask your doctor what other things that you can do to help prevent falls. This information is not intended to replace advice given to you by your health care provider. Make sure you discuss any questions you have with your health care provider. Document Released: 08/26/2009 Document Revised: 04/06/2016 Document Reviewed: 12/04/2014 Elsevier Interactive Patient Education  2017 [employer].

## 2021-07-26 NOTE — Progress Notes (Signed)
I connected with Sheppard Penton today by telephone and verified that I am speaking with the correct person using two identifiers. Location patient: home Location provider: work Persons participating in the virtual visit: Brady Sanchez, Brady Durand LPN.   I discussed the limitations, risks, security and privacy concerns of performing an evaluation and management service by telephone and the availability of in person appointments. I also discussed with the patient that there may be a patient responsible charge related to this service. The patient expressed understanding and verbally consented to this telephonic visit.    Interactive audio and video telecommunications were attempted between this provider and patient, however failed, due to patient having technical difficulties OR patient did not have access to video capability.  We continued and completed visit with audio only.     Vital signs may be patient reported or missing.  Subjective:   [name] is a 75 y.o. male who presents for an Initial Medicare Annual Wellness Visit.  Review of Systems     Cardiac Risk Factors include: advanced age (>56mn, >>85women);diabetes mellitus;dyslipidemia;hypertension;obesity (BMI >30kg/m2)     Objective:    Today's Vitals   07/26/21 1316  Weight: 240 lb (108.9 kg)  Height: '5\' 9"'$  (1.753 m)   Body mass index is 35.44 kg/m.  Advanced Directives 07/26/2021 08/06/2019 11/13/2018 11/13/2018 07/05/2018  Does Patient Have a Medical Advance Directive? No No No No No  Would patient like information on creating a medical advance directive? - No - Patient declined No - Patient declined - No - Patient declined    Current Medications (verified) Outpatient Encounter Medications as of 07/26/2021  Medication Sig   amLODipine (NORVASC) 10 MG tablet TAKE 1 TABLET BY MOUTH EVERY DAY   atorvastatin (LIPITOR) 20 MG tablet TAKE 1 TABLET BY MOUTH EVERY DAY   hydrochlorothiazide (HYDRODIURIL) 25 MG  tablet TAKE 1 TABLET BY MOUTH EVERY DAY   lisinopril (ZESTRIL) 40 MG tablet TAKE 1 TABLET BY MOUTH EVERY DAY   metFORMIN (GLUCOPHAGE) 1000 MG tablet TAKE 1 TABLET (1,000 MG TOTAL) BY MOUTH 2 (TWO) TIMES DAILY WITH A MEAL.   metoprolol succinate (TOPROL-XL) 50 MG 24 hr tablet TAKE 1 TABLET (50 MG TOTAL) BY MOUTH DAILY. FOR HIGH BLOOD PRESSURE   triamcinolone cream (KENALOG) 0.5 % APPLY TOPICALLY TO AFFECTED AREA EVERY DAY   Albuterol Sulfate (PROAIR RESPICLICK) 1123XX123(90 Base) MCG/ACT AEPB Inhale 2 puffs into the lungs every 4 (four) hours as needed. (Patient not taking: Reported on 07/26/2021)   fluticasone (FLONASE) 50 MCG/ACT nasal spray PLACE 2 SPRAYS INTO BOTH NOSTRILS DAILY. USE FOR 4-6 WEEKS THEN STOP AND USE SEASONALLY OR AS NEEDED. (Patient not taking: Reported on 07/26/2021)   No facility-administered encounter medications on file as of 07/26/2021.    Allergies (verified) Dm-doxylamine-acetaminophen   History: Past Medical History:  Diagnosis Date   Hyperlipidemia    Hypertension    Past Surgical History:  Procedure Laterality Date   CATARACT EXTRACTION     REPLACEMENT TOTAL KNEE     Family History  Problem Relation Age of Onset   Seizures Mother    Cancer Father        lung   Social History   Socioeconomic History   Marital status: Widowed    Spouse name: Not on file   Number of children: Not on file   Years of education: Not on file   Highest education level: Not on file  Occupational History   Occupation: works at [employer]  Tobacco Use   Smoking status: Never   Smokeless tobacco: Never  Vaping Use   Vaping Use: Never used  Substance and Sexual Activity   Alcohol use: Yes   Drug use: No   Sexual activity: Not on file  Other Topics Concern   Not on file  Social History Narrative   Not on file   Social Determinants of Health   Financial Resource Strain: Low Risk    Difficulty of Paying Living Expenses: Not hard at all  Food  Insecurity: No Food Insecurity   Worried About Charity fundraiser in the Last Year: Never true   [place] in the Last Year: Never true  Transportation Needs: No Transportation Needs   Lack of Transportation (Medical): No   Lack of Transportation (Non-Medical): No  Physical Activity: Inactive   Days of Exercise per Week: 0 days   Minutes of Exercise per Session: 0 min  Stress: No Stress Concern Present   Feeling of Stress : Not at all  Social Connections: Not on file    Tobacco Counseling Counseling given: Not Answered   Clinical Intake:  Pre-visit preparation completed: Yes  Pain : No/denies pain     Nutritional Status: BMI > 30  Obese Nutritional Risks: None Diabetes: Yes  How often do you need to have someone help you when you read instructions, pamphlets, or other written materials from your doctor or pharmacy?: 1 - Never What is the last grade level you completed in school?: 9th grade  Diabetic? Yes Nutrition Risk Assessment:  Has the patient had any N/V/D within the last 2 months?  No  Does the patient have any non-healing wounds?  No  Has the patient had any unintentional weight loss or weight gain?  No   Diabetes:  Is the patient diabetic?  Yes  If diabetic, was a CBG obtained today?  No  Did the patient bring in their glucometer from home?  No  How often do you monitor your CBG's? Does not.   Financial Strains and Diabetes Management:  Are you having any financial strains with the device, your supplies or your medication? No .  Does the patient want to be seen by Chronic Care Management for management of their diabetes?  No  Would the patient like to be referred to a Nutritionist or for Diabetic Management?  No   Diabetic Exams:  Diabetic Eye Exam: Overdue for diabetic eye exam. Pt has been advised about the importance in completing this exam. Patient advised to call and schedule an eye exam. Diabetic Foot Exam: Completed  01/25/2021   Interpreter Needed?: No  Information entered by :: NAllen LPN   Activities of Daily Living In your present state of health, do you have any difficulty performing the following activities: 07/26/2021  Hearing? N  Vision? N  Difficulty concentrating or making decisions? N  Walking or climbing stairs? N  Comment a little  Dressing or bathing? N  Doing errands, shopping? N  Preparing Food and eating ? N  Using the Toilet? N  In the past six months, have you accidently leaked urine? N  Do you have problems with loss of bowel control? N  Managing your Medications? N  Managing your Finances? N  Housekeeping or managing your Housekeeping? N  Some recent data might be hidden    Patient Care Team: Olin Hauser, DO as PCP - General (Family Medicine)  Indicate any recent Medical Services you may have received  from other than [facility] providers in the past year (date may be approximate).     Assessment:   This is a routine wellness examination for [place].  Hearing/Vision screen Vision Screening - Comments:: No regular eye exams, [facility]  Dietary issues and exercise activities discussed: Current Exercise Habits: The patient has a physically strenuous job, but has no regular exercise apart from work.   Goals Addressed             This Visit's Progress    Patient Stated       07/26/2021, no goals       Depression Screen PHQ 2/9 Scores 07/26/2021 09/21/2020 06/18/2020 12/19/2019 08/19/2019 04/10/2019 01/02/2019  PHQ - 2 Score 0 0 0 0 0 0 0  PHQ- 9 Score - - - - 0 - -    Fall Risk Fall Risk  07/26/2021 09/21/2020 06/18/2020 12/19/2019 04/10/2019  Falls in the past year? 0 0 0 0 0  Number falls in past yr: - 0 0 0 -  Injury with Fall? - 0 0 0 -  Risk for fall due to : Medication side effect - - - -  Follow up Falls evaluation completed;Education provided;Falls prevention discussed Falls evaluation completed Falls evaluation completed Falls evaluation completed  Falls evaluation completed    FALL RISK PREVENTION PERTAINING TO THE HOME:  Any stairs in or around the home? No  If so, are there any without handrails?  N/a Home free of loose throw rugs in walkways, pet beds, electrical cords, etc? Yes  Adequate lighting in your home to reduce risk of falls? Yes   ASSISTIVE DEVICES UTILIZED TO PREVENT FALLS:  Life alert? No  Use of a cane, walker or w/c? No  Grab bars in the bathroom? No  Shower chair or bench in shower? No  Elevated toilet seat or a handicapped toilet? No   TIMED UP AND GO:  Was the test performed? No .  .     Cognitive Function:     6CIT Screen 07/26/2021  What Year? 0 points  What month? 0 points  What time? 0 points  Count back from 20 0 points  Months in reverse 4 points  Repeat phrase 6 points  Total Score 10    Immunizations Immunization History  Administered Date(s) Administered   Fluad Quad(high Dose 65+) 08/19/2019   Moderna Sars-Covid-2 Vaccination 12/22/2019, 01/19/2020    TDAP status: Due, Education has been provided regarding the importance of this vaccine. Advised may receive this vaccine at local pharmacy or Health Dept. Aware to provide a copy of the vaccination record if obtained from local pharmacy or Health Dept. Verbalized acceptance and understanding.  Flu Vaccine status: Declined, Education has been provided regarding the importance of this vaccine but patient still declined. Advised may receive this vaccine at local pharmacy or Health Dept. Aware to provide a copy of the vaccination record if obtained from local pharmacy or Health Dept. Verbalized acceptance and understanding.  Pneumococcal vaccine status: Declined,  Education has been provided regarding the importance of this vaccine but patient still declined. Advised may receive this vaccine at local pharmacy or Health Dept. Aware to provide a copy of the vaccination record if obtained from local pharmacy or Health Dept. Verbalized  acceptance and understanding.   Covid-19 vaccine status: Completed vaccines  Qualifies for Shingles Vaccine? Yes   Zostavax completed No   Shingrix Completed?: No.    Education has been provided regarding the importance of this vaccine. Patient has  been advised to call insurance company to determine out of pocket expense if they have not yet received this vaccine. Advised may also receive vaccine at local pharmacy or Health Dept. Verbalized acceptance and understanding.  Screening Tests Health Maintenance  Topic Date Due   OPHTHALMOLOGY EXAM  06/22/2020   COVID-19 Vaccine (3 - Booster for Moderna series) 08/11/2021 (Originally 06/20/2020)   Zoster Vaccines- Shingrix (1 of 2) 10/25/2021 (Originally 08/12/1995)   INFLUENZA VACCINE  02/10/2022 (Originally 06/13/2021)   TETANUS/TDAP  07/26/2022 (Originally [birth year]/1964)   PNA vac Low Risk Adult (1 of 2 - PCV13) 07/26/2022 (Originally 08/11/2010)   HEMOGLOBIN A1C  07/28/2021   FOOT EXAM  01/25/2022   Fecal DNA (Cologuard)  04/20/2022   Hepatitis C Screening  Completed   HPV VACCINES  Aged Out    Health Maintenance  Health Maintenance Due  Topic Date Due   OPHTHALMOLOGY EXAM  06/22/2020    Colorectal cancer screening: Type of screening: Cologuard. Completed 05/02/2019. Repeat every 3 years  Lung Cancer Screening: (Low Dose CT Chest recommended if Age 13-80 years, 30 pack-year currently smoking OR have quit w/in 15years.) does not qualify.   Lung Cancer Screening Referral: no  Additional Screening:  Hepatitis C Screening: does qualify; Completed 04/03/2019  Vision Screening: Recommended annual ophthalmology exams for early detection of glaucoma and other disorders of the eye. Is the patient up to date with their annual eye exam?  No  Who is the provider or what is the name of the office in which the patient attends annual eye exams? [facility] If pt is not established with a provider, would they like to be referred to a provider to  establish care? No .   Dental Screening: Recommended annual dental exams for proper oral hygiene  Community Resource Referral / Chronic Care Management: CRR required this visit?  No   CCM required this visit?  No      Plan:     I have personally reviewed and noted the following in the patient's chart:   Medical and social history Use of alcohol, tobacco or illicit drugs  Current medications and supplements including opioid prescriptions. Patient is not currently taking opioid prescriptions. Functional ability and status Nutritional status Physical activity Advanced directives List of other physicians Hospitalizations, surgeries, and ER visits in previous 12 months Vitals Screenings to include cognitive, depression, and falls Referrals and appointments  In addition, I have reviewed and discussed with patient certain preventive protocols, quality metrics, and best practice recommendations. A written personalized care plan for preventive services as well as general preventive health recommendations were provided to patient.     Kellie Simmering, LPN   [phone]   Nurse Notes:

## 2021-07-30 ENCOUNTER — Other Ambulatory Visit: Payer: Self-pay | Admitting: Family Medicine

## 2021-07-30 DIAGNOSIS — I10 Essential (primary) hypertension: Secondary | ICD-10-CM

## 2021-07-30 DIAGNOSIS — E785 Hyperlipidemia, unspecified: Secondary | ICD-10-CM

## 2021-08-08 ENCOUNTER — Other Ambulatory Visit: Payer: Self-pay

## 2021-08-08 DIAGNOSIS — E785 Hyperlipidemia, unspecified: Secondary | ICD-10-CM

## 2021-08-08 MED ORDER — ATORVASTATIN CALCIUM 20 MG PO TABS: 20.0000 mg | ORAL_TABLET | Freq: Every day | ORAL | 1 refills | Status: DC

## 2021-09-13 ENCOUNTER — Encounter: Payer: Self-pay | Admitting: Family Medicine

## 2021-09-13 ENCOUNTER — Ambulatory Visit (INDEPENDENT_AMBULATORY_CARE_PROVIDER_SITE_OTHER): Payer: Medicare Other | Admitting: Family Medicine

## 2021-09-13 ENCOUNTER — Other Ambulatory Visit: Payer: Self-pay

## 2021-09-13 VITALS — BP 139/68 | HR 58 | Ht 69.0 in | Wt 219.2 lb

## 2021-09-13 DIAGNOSIS — E669 Obesity, unspecified: Secondary | ICD-10-CM

## 2021-09-13 DIAGNOSIS — M159 Polyosteoarthritis, unspecified: Secondary | ICD-10-CM

## 2021-09-13 DIAGNOSIS — I10 Essential (primary) hypertension: Secondary | ICD-10-CM | POA: Diagnosis not present

## 2021-09-13 DIAGNOSIS — Z125 Encounter for screening for malignant neoplasm of prostate: Secondary | ICD-10-CM

## 2021-09-13 DIAGNOSIS — E1169 Type 2 diabetes mellitus with other specified complication: Secondary | ICD-10-CM

## 2021-09-13 DIAGNOSIS — R351 Nocturia: Secondary | ICD-10-CM

## 2021-09-13 DIAGNOSIS — E785 Hyperlipidemia, unspecified: Secondary | ICD-10-CM

## 2021-09-13 MED ORDER — TRIAMCINOLONE ACETONIDE 0.5 % EX CREA
TOPICAL_CREAM | Freq: Every day | CUTANEOUS | 3 refills | Status: DC | PRN
Start: 2021-09-13 — End: 2022-01-04

## 2021-09-13 MED ORDER — AMLODIPINE BESYLATE 10 MG PO TABS: 10.0000 mg | ORAL_TABLET | Freq: Every day | ORAL | 3 refills | Status: DC

## 2021-09-13 MED ORDER — HYDROCHLOROTHIAZIDE 25 MG PO TABS: 25.0000 mg | ORAL_TABLET | Freq: Every day | ORAL | 3 refills | Status: DC

## 2021-09-13 MED ORDER — METOPROLOL SUCCINATE ER 50 MG PO TB24: 50.0000 mg | ORAL_TABLET | Freq: Every day | ORAL | 3 refills | Status: DC

## 2021-09-13 MED ORDER — LISINOPRIL 40 MG PO TABS: 40.0000 mg | ORAL_TABLET | Freq: Every day | ORAL | 3 refills | Status: DC

## 2021-09-13 MED ORDER — ATORVASTATIN CALCIUM 20 MG PO TABS: 20.0000 mg | ORAL_TABLET | Freq: Every day | ORAL | 3 refills | Status: DC

## 2021-09-13 NOTE — Assessment & Plan Note (Signed)
Stable controlled for age previously 70, due for A1c No hypoglycemia or hyperglycemia Complications - hyperlipidemia, obesity, hypothyroidism, - increases risk of future cardiovascular complications   Plan:  1. Continue current therapy - Metformin 1000mg  BID 2. Encourage improved lifestyle - low carb, low sugar diet, reduce portion size, continue improving regular exercise 3. Check CBG , bring log to next visit for review 4. Continue ACEi, Statin  Need updated DM eye exam Dr Matilde Sprang - he will work on scheduling, had issue with loss of job and lack of supplemental insurance.

## 2021-09-13 NOTE — Assessment & Plan Note (Signed)
Controlled cholesterol on statin and lifestyle Due for repeat lipid panel  Plan: 1. Continue current meds - Atorvastatin 20mg 2. Encourage improved lifestyle - low carb/cholesterol, reduce portion size, continue improving regular exercise 

## 2021-09-13 NOTE — Patient Instructions (Addendum)
Thank you for coming to the office today.  Keep on current medications. Refills were sent to pharmacy for 90 day with refills  Followup with Dr Yves Dill if interested to discuss again with them about what to do next for bladder.  Please schedule a Follow-up Appointment to: Return in about 6 months (around 03/13/2022) for 6 month follow-up DM A1c, HTN, Foot exam.  If you have any other questions or concerns, please feel free to call the office or send a message through [place]. You may also schedule an earlier appointment if necessary.  Additionally, you may be receiving a survey about your experience at our office within a few days to 1 week by e-mail or mail. We value your feedback.  Nobie Putnam, DO [place]

## 2021-09-13 NOTE — Progress Notes (Signed)
Subjective:    Patient ID: Brady Sanchez, male    DOB: 17-Jul-1946, 75 y.o.   MRN: [id number]  Brady Sanchez is a 75 y.o. male presenting on 09/13/2021 for Diabetes   HPI  CHRONIC DM, Type 2 / Obesity BMI >32 Due for labs and A1c No new concerns. Not checking sugar regularly. CBGs: none recently Meds: Metformin 1000mg  BID Reports good compliance. Tolerating well w/o side-effects Currently on ACEi Lifestyle:  - Diet (low sugar low carb diet, trying to improve)  - Exercise (limited recently) S/p DM Eye Exam Dr Matilde Sprang 06/23/19. He is s/p cataract removal surgery 9/17 and 9/27 with improved vision able to read more, less blurry. He will work on returning to [employer] for DM eye exam, he changed jobs and issue with insurance coverage. Denies hypoglycemia, polyuria, visual changes, numbness or tingling.   CHRONIC HTN: Not checking BP at home Improved BP here. He says no particular other reason for Metoprolol. No heart history, no tachycardia or MI. Current Meds - Amlodipine 10mg  daily, Lisinopril 40mg  daily, HCTZ 25mg  daily, Metoprolol XL 50mg  daily Reports good compliance, took meds today. Tolerating well, w/o complaints. Denies CP, dyspnea, edema, dizziness / lightheadedness, HA  Health Maintenance:  COVID19 Booster not updated, he received initial vaccines.  Due for Flu Shot, declines today despite counseling on benefits   Depression screen [facility] 2/9 09/13/2021 07/26/2021 09/21/2020  Decreased Interest 0 0 0  Down, Depressed, Hopeless 0 0 0  PHQ - 2 Score 0 0 0  Altered sleeping 0 - -  Tired, decreased energy 0 - -  Change in appetite 0 - -  Feeling bad or failure about yourself  0 - -  Trouble concentrating 0 - -  Moving slowly or fidgety/restless 0 - -  Suicidal thoughts 0 - -  PHQ-9 Score 0 - -  Difficult doing work/chores Not difficult at all - -    Past Medical History:  Diagnosis Date   Hyperlipidemia    Hypertension    Past Surgical History:  Procedure  Laterality Date   CATARACT EXTRACTION     REPLACEMENT TOTAL KNEE     Social History   Socioeconomic History   Marital status: Widowed    Spouse name: Not on file   Number of children: Not on file   Years of education: Not on file   Highest education level: Not on file  Occupational History   Occupation: works at [employer]  Tobacco Use   Smoking status: Never   Smokeless tobacco: Never  Vaping Use   Vaping Use: Never used  Substance and Sexual Activity   Alcohol use: Yes   Drug use: No   Sexual activity: Not on file  Other Topics Concern   Not on file  Social History Narrative   Not on file   Social Determinants of Health   Financial Resource Strain: Low Risk    Difficulty of Paying Living Expenses: Not hard at all  Food Insecurity: No Food Insecurity   Worried About Charity fundraiser in the Last Year: Never true   [place] in the Last Year: Never true  Transportation Needs: No Transportation Needs   Lack of Transportation (Medical): No   Lack of Transportation (Non-Medical): No  Physical Activity: Inactive   Days of Exercise per Week: 0 days   Minutes of Exercise per Session: 0 min  Stress: No Stress Concern Present   Feeling of Stress : Not at all  Social Connections: Not on file  Intimate Partner Violence: Not on file   Family History  Problem Relation Age of Onset   Seizures Mother    Cancer Father        lung   Current Outpatient Medications on File Prior to Visit  Medication Sig   metFORMIN (GLUCOPHAGE) 1000 MG tablet TAKE 1 TABLET (1,000 MG TOTAL) BY MOUTH 2 (TWO) TIMES DAILY WITH A MEAL.   Albuterol Sulfate (PROAIR RESPICLICK) 191 (90 Base) MCG/ACT AEPB Inhale 2 puffs into the lungs every 4 (four) hours as needed. (Patient not taking: No sig reported)   fluticasone (FLONASE) 50 MCG/ACT nasal spray PLACE 2 SPRAYS INTO BOTH NOSTRILS DAILY. USE FOR 4-6 WEEKS THEN STOP AND USE SEASONALLY OR AS NEEDED. (Patient not taking: No sig  reported)   No current facility-administered medications on file prior to visit.    Review of Systems  Constitutional:  Negative for activity change, appetite change, chills, diaphoresis, fatigue and fever.  HENT:  Negative for congestion and hearing loss.   Eyes:  Negative for visual disturbance.  Respiratory:  Negative for cough, chest tightness, shortness of breath and wheezing.   Cardiovascular:  Negative for chest pain, palpitations and leg swelling.  Gastrointestinal:  Negative for abdominal pain, constipation, diarrhea, nausea and vomiting.  Genitourinary:  Negative for dysuria, frequency and hematuria.  Musculoskeletal:  Negative for arthralgias and neck pain.  Skin:  Negative for rash.  Neurological:  Negative for dizziness, weakness, light-headedness, numbness and headaches.  Hematological:  Negative for adenopathy.  Psychiatric/Behavioral:  Negative for behavioral problems, dysphoric mood and sleep disturbance.   Per HPI unless specifically indicated above      Objective:    BP 139/68   Pulse (!) 58   Ht 5\' 9"  (1.753 m)   Wt 219 lb 3.2 oz (99.4 kg)   SpO2 100%   BMI 32.37 kg/m   Wt Readings from Last 3 Encounters:  09/13/21 219 lb 3.2 oz (99.4 kg)  07/26/21 240 lb (108.9 kg)  01/25/21 226 lb 6.4 oz (102.7 kg)    Physical Exam Vitals and nursing note reviewed.  Constitutional:      General: He is not in acute distress.    Appearance: He is well-developed. He is not diaphoretic.     Comments: Well-appearing, comfortable, cooperative  HENT:     Head: Normocephalic and atraumatic.  Eyes:     General:        Right eye: No discharge.        Left eye: No discharge.     Conjunctiva/sclera: Conjunctivae normal.     Pupils: Pupils are equal, round, and reactive to light.  Neck:     Thyroid: No thyromegaly.  Cardiovascular:     Rate and Rhythm: Normal rate and regular rhythm.     Pulses: Normal pulses.     Heart sounds: Normal heart sounds. No murmur  heard. Pulmonary:     Effort: Pulmonary effort is normal. No respiratory distress.     Breath sounds: Normal breath sounds. No wheezing or rales.  Abdominal:     General: Bowel sounds are normal. There is no distension.     Palpations: Abdomen is soft. There is no mass.     Tenderness: There is no abdominal tenderness.  Musculoskeletal:        General: No tenderness. Normal range of motion.     Cervical back: Normal range of motion and neck supple.     Comments: Upper / Lower Extremities: -  Normal muscle tone, strength bilateral upper extremities 5/5, lower extremities 5/5  Lymphadenopathy:     Cervical: No cervical adenopathy.  Skin:    General: Skin is warm and dry.     Findings: No erythema or rash.  Neurological:     Mental Status: He is alert and oriented to person, place, and time.     Comments: Distal sensation intact to light touch all extremities  Psychiatric:        Mood and Affect: Mood normal.        Behavior: Behavior normal.        Thought Content: Thought content normal.     Comments: Well groomed, good eye contact, normal speech and thoughts     Results for orders placed or performed in visit on 01/25/21  POCT HgB A1C  Result Value Ref Range   Hemoglobin A1C 7.0 (A) 4.0 - 5.6 %      Assessment & Plan:   Problem List Items Addressed This Visit     Type 2 diabetes mellitus with other specified complication ([place]) - Primary    Stable controlled for age previously 24, due for A1c No hypoglycemia or hyperglycemia Complications - hyperlipidemia, obesity, hypothyroidism, - increases risk of future cardiovascular complications   Plan:  1. Continue current therapy - Metformin 1000mg  BID 2. Encourage improved lifestyle - low carb, low sugar diet, reduce portion size, continue improving regular exercise 3. Check CBG , bring log to next visit for review 4. Continue ACEi, Statin  Need updated DM eye exam Dr Matilde Sprang - he will work on scheduling, had issue with loss of  job and lack of supplemental insurance.      Relevant Medications   atorvastatin (LIPITOR) 20 MG tablet   lisinopril (ZESTRIL) 40 MG tablet   Other Relevant Orders   COMPLETE METABOLIC PANEL WITH GFR   Hemoglobin A1c   Osteoarthritis of multiple joints   Obesity (BMI 30.0-34.9)   Hyperlipidemia associated with type 2 diabetes mellitus (HCC)    Controlled cholesterol on statin and lifestyle Due for repeat lipid panel  Plan: 1. Continue current meds - Atorvastatin 20mg  2. Encourage improved lifestyle - low carb/cholesterol, reduce portion size, continue improving regular exercise      Relevant Medications   amLODipine (NORVASC) 10 MG tablet   atorvastatin (LIPITOR) 20 MG tablet   hydrochlorothiazide (HYDRODIURIL) 25 MG tablet   lisinopril (ZESTRIL) 40 MG tablet   metoprolol succinate (TOPROL-XL) 50 MG 24 hr tablet   triamcinolone cream (KENALOG) 0.5 %   Other Relevant Orders   Lipid panel   Essential hypertension    Improved HTN on current meds Home BP readings none available  No known complications    Plan:  1. Continue Amlodipine 10mg  daily, Lisinopril 40mg  daily, HCTZ 25mg  daily, Metoprolol XL 50mg  daily 2. Encourage improved lifestyle - emphasized importance of low sodium diet, regular exercise 3. Keep monitor BP outside office, bring readings to next visit, if persistently >140/90 or new symptoms notify office sooner      Relevant Medications   amLODipine (NORVASC) 10 MG tablet   atorvastatin (LIPITOR) 20 MG tablet   hydrochlorothiazide (HYDRODIURIL) 25 MG tablet   lisinopril (ZESTRIL) 40 MG tablet   metoprolol succinate (TOPROL-XL) 50 MG 24 hr tablet   Other Relevant Orders   COMPLETE METABOLIC PANEL WITH GFR   CBC with Differential/Platelet   Other Visit Diagnoses     Prostate cancer screening       Nocturia  Relevant Orders   PSA       Updated Health Maintenance information Declines Vaccines Flu/COVID booster Reviewed recent lab results with  patient Encouraged improvement to lifestyle with diet and exercise Goal of weight loss   Meds ordered this encounter  Medications   amLODipine (NORVASC) 10 MG tablet    Sig: Take 1 tablet (10 mg total) by mouth daily.    Dispense:  90 tablet    Refill:  3   atorvastatin (LIPITOR) 20 MG tablet    Sig: Take 1 tablet (20 mg total) by mouth daily.    Dispense:  90 tablet    Refill:  3   hydrochlorothiazide (HYDRODIURIL) 25 MG tablet    Sig: Take 1 tablet (25 mg total) by mouth daily.    Dispense:  90 tablet    Refill:  3   lisinopril (ZESTRIL) 40 MG tablet    Sig: Take 1 tablet (40 mg total) by mouth daily.    Dispense:  90 tablet    Refill:  3   metoprolol succinate (TOPROL-XL) 50 MG 24 hr tablet    Sig: Take 1 tablet (50 mg total) by mouth daily. for high blood pressure    Dispense:  90 tablet    Refill:  3   triamcinolone cream (KENALOG) 0.5 %    Sig: Apply topically daily as needed.    Dispense:  15 g    Refill:  3     Follow up plan: Return in about 6 months (around 03/13/2022) for 6 month follow-up DM A1c, HTN, Foot exam.  Nobie Putnam, DO [facility] 09/13/2021, 1:30 PM

## 2021-09-13 NOTE — Assessment & Plan Note (Signed)
Improved HTN on current meds Home BP readings none available  No known complications    Plan:  1. Continue Amlodipine 10mg daily, Lisinopril 40mg daily, HCTZ 25mg daily, Metoprolol XL 50mg daily 2. Encourage improved lifestyle - emphasized importance of low sodium diet, regular exercise 3. Keep monitor BP outside office, bring readings to next visit, if persistently >140/90 or new symptoms notify office sooner 

## 2021-09-14 LAB — CBC WITH DIFFERENTIAL/PLATELET
Absolute Monocytes: 693 cells/uL (ref 200–950)
Basophils Absolute: 40 cells/uL (ref 0–200)
Basophils Relative: 0.6 %
Eosinophils Absolute: 172 cells/uL (ref 15–500)
Eosinophils Relative: 2.6 %
HCT: 45.7 % (ref 38.5–50.0)
Hemoglobin: 14.6 g/dL (ref 13.2–17.1)
Lymphs Abs: 1637 cells/uL (ref 850–3900)
MCH: 26.1 pg — ABNORMAL LOW (ref 27.0–33.0)
MCHC: 31.9 g/dL — ABNORMAL LOW (ref 32.0–36.0)
MCV: 81.6 fL (ref 80.0–100.0)
MPV: 10.6 fL (ref 7.5–12.5)
Monocytes Relative: 10.5 %
Neutro Abs: 4059 cells/uL (ref 1500–7800)
Neutrophils Relative %: 61.5 %
Platelets: 285 10*3/uL (ref 140–400)
RBC: 5.6 10*6/uL (ref 4.20–5.80)
RDW: 12.9 % (ref 11.0–15.0)
Total Lymphocyte: 24.8 %
WBC: 6.6 10*3/uL (ref 3.8–10.8)

## 2021-09-14 LAB — LIPID PANEL
Cholesterol: 141 mg/dL (ref <200)
HDL: 45 mg/dL (ref >=40)
LDL Cholesterol (Calc): 79 mg/dL (calc)
Non-HDL Cholesterol (Calc): 96 mg/dL (calc) (ref <130)
Total CHOL/HDL Ratio: 3.1 (calc) (ref <5.0)
Triglycerides: 90 mg/dL (ref <150)

## 2021-09-14 LAB — COMPLETE METABOLIC PANEL WITH GFR
AG Ratio: 1.5 (calc) (ref 1.0–2.5)
ALT: 22 U/L (ref 9–46)
AST: 20 U/L (ref 10–35)
Albumin: 4.5 g/dL (ref 3.6–5.1)
Alkaline phosphatase (APISO): 52 U/L (ref 35–144)
BUN: 10 mg/dL (ref 7–25)
CO2: 27 mmol/L (ref 20–32)
Calcium: 9.8 mg/dL (ref 8.6–10.3)
Chloride: 99 mmol/L (ref 98–110)
Creat: 1 mg/dL (ref 0.70–1.28)
Globulin: 3 g/dL (calc) (ref 1.9–3.7)
Glucose, Bld: 114 mg/dL — ABNORMAL HIGH (ref 65–99)
Potassium: 3.8 mmol/L (ref 3.5–5.3)
Sodium: 136 mmol/L (ref 135–146)
Total Bilirubin: 0.5 mg/dL (ref 0.2–1.2)
Total Protein: 7.5 g/dL (ref 6.1–8.1)
eGFR: 78 mL/min/{1.73_m2} (ref >=60)

## 2021-09-14 LAB — HEMOGLOBIN A1C
Hgb A1c MFr Bld: 7 % of total Hgb — ABNORMAL HIGH (ref <5.7)
Mean Plasma Glucose: 154 mg/dL
eAG (mmol/L): 8.5 mmol/L

## 2021-09-14 LAB — PSA: PSA: 2.57 ng/mL (ref <=4.00)

## 2022-01-03 ENCOUNTER — Other Ambulatory Visit: Payer: Self-pay | Admitting: Family Medicine

## 2022-01-03 DIAGNOSIS — L309 Dermatitis, unspecified: Secondary | ICD-10-CM

## 2022-01-03 DIAGNOSIS — E785 Hyperlipidemia, unspecified: Secondary | ICD-10-CM

## 2022-01-03 DIAGNOSIS — E1169 Type 2 diabetes mellitus with other specified complication: Secondary | ICD-10-CM

## 2022-01-04 NOTE — Telephone Encounter (Signed)
Requested medication (s) are due for refill today: yes  Requested medication (s) are on the active medication list: yes  Last refill:  09/13/21 #15g/3  Future visit scheduled: no  Notes to clinic:  Unable to refill per protocol, cannot delegate.      Requested Prescriptions  Pending Prescriptions Disp Refills   triamcinolone cream (KENALOG) 0.5 % [Pharmacy Med Name: TRIAMCINOLONE 0.5% CREAM] 15 g 3    Sig: APPLY TO AFFECTED AREA EVERY DAY AS NEEDED     Not Delegated - Dermatology:  Corticosteroids Failed - 01/03/2022  4:22 PM      Failed - This refill cannot be delegated      Passed - Valid encounter within last 12 months    Recent Outpatient Visits           3 months ago Type 2 diabetes mellitus with other specified complication, without long-term current use of insulin ([place])   [facility], Devonne Doughty, DO   11 months ago Type 2 diabetes mellitus with other specified complication, without long-term current use of insulin ([place])   [facility], Devonne Doughty, DO   1 year ago Type 2 diabetes mellitus with other specified complication, without long-term current use of insulin [facility])   [facility] Olin Hauser, DO   1 year ago Essential hypertension   [facility] Olin Hauser, DO   2 years ago COVID-42 virus infection   [facility], Lilia Argue, [place]       Future Appointments             In 6 months [facility], [facility]

## 2022-07-09 ENCOUNTER — Other Ambulatory Visit: Payer: Self-pay | Admitting: Family Medicine

## 2022-07-09 DIAGNOSIS — E1169 Type 2 diabetes mellitus with other specified complication: Secondary | ICD-10-CM

## 2022-07-10 NOTE — Telephone Encounter (Signed)
Requested Prescriptions  Pending Prescriptions Disp Refills  . metFORMIN (GLUCOPHAGE) 1000 MG tablet [Pharmacy Med Name: METFORMIN HCL 1,000 MG TABLET] 180 tablet 0    Sig: TAKE 1 TABLET (1,000 MG TOTAL) BY MOUTH 2 (TWO) TIMES DAILY WITH A MEAL.     Endocrinology:  Diabetes - Biguanides Failed - 07/09/2022  9:33 AM      Failed - HBA1C is between 0 and 7.9 and within 180 days    Hemoglobin A1C  Date Value Ref Range Status  05/13/2018 7.9  Final   Hgb A1c MFr Bld  Date Value Ref Range Status  09/13/2021 7.0 (H) <5.7 % of total Hgb Final    Comment:    For someone without known diabetes, a hemoglobin A1c value of 6.5% or greater indicates that they may have  diabetes and this should be confirmed with a follow-up  test. . For someone with known diabetes, a value <7% indicates  that their diabetes is well controlled and a value  greater than or equal to 7% indicates suboptimal  control. A1c targets should be individualized based on  duration of diabetes, age, comorbid conditions, and  other considerations. . Currently, no consensus exists regarding use of hemoglobin A1c for diagnosis of diabetes for children. .          Failed - B12 Level in normal range and within 720 days    No results found for: "VITAMINB12"       Failed - Valid encounter within last 6 months    Recent Outpatient Visits          10 months ago Type 2 diabetes mellitus with other specified complication, without long-term current use of insulin ([place])   [facility], Devonne Doughty, DO   1 year ago Type 2 diabetes mellitus with other specified complication, without long-term current use of insulin ([place])   [facility], Devonne Doughty, DO   1 year ago Type 2 diabetes mellitus with other specified complication, without long-term current use of insulin ([place])   [place], Devonne Doughty, DO   2 years ago Essential hypertension   [facility] Olin Hauser, DO   2 years ago COVID-66 virus infection   [facility], Lilia Argue, [place]      Future Appointments            In 3 weeks [facility], [place] in normal range and within 360 days    Creat  Date Value Ref Range Status  09/13/2021 1.00 0.70 - 1.28 mg/dL Final         Passed - eGFR in normal range and within 360 days    GFR, Est African American  Date Value Ref Range Status  06/14/2020 83 > OR = 60 mL/min/1.70m Final   GFR, Est Non African American  Date Value Ref Range Status  06/14/2020 71 > OR = 60 mL/min/1.755mFinal   eGFR  Date Value Ref Range Status  09/13/2021 78 > OR = 60 mL/min/1.7373minal    Comment:    The eGFR is based on the CKD-EPI 2021 equation. To calculate  the new eGFR from a previous Creatinine or Cystatin C result, go to https://www.kidney.org/professionals/ kdoqi/gfr%5Fcalculator          Passed - CBC within normal limits and completed in the last 12 months  WBC  Date Value Ref Range Status  09/13/2021 6.6 3.8 - 10.8 Thousand/uL Final   RBC  Date Value Ref Range Status  09/13/2021 5.60 4.20 - 5.80 Million/uL Final   Hemoglobin  Date Value Ref Range Status  09/13/2021 14.6 13.2 - 17.1 g/dL Final   HGB  Date Value Ref Range Status  01/30/2012 14.1 13.0 - 18.0 g/dL Final   HCT  Date Value Ref Range Status  09/13/2021 45.7 38.5 - 50.0 % Final  01/30/2012 42.7 40.0 - 52.0 % Final   MCHC  Date Value Ref Range Status  09/13/2021 31.9 (L) 32.0 - 36.0 g/dL Final   [facility]  Date Value Ref Range Status  09/13/2021 26.1 (L) 27.0 - 33.0 pg Final   MCV  Date Value Ref Range Status  09/13/2021 81.6 80.0 - 100.0 fL Final  01/30/2012 81 80 - 100 fL Final   No results found for: "PLTCOUNTKUC", "LABPLAT", "POCPLA" RDW  Date Value Ref Range Status  09/13/2021 12.9 11.0 - 15.0 % Final  01/30/2012 13.0 11.5 - 14.5 % Final

## 2022-07-18 ENCOUNTER — Other Ambulatory Visit: Payer: Self-pay | Admitting: Family Medicine

## 2022-07-18 DIAGNOSIS — L309 Dermatitis, unspecified: Secondary | ICD-10-CM

## 2022-07-20 NOTE — Telephone Encounter (Signed)
Requested medication (s) are due for refill today: yes  Requested medication (s) are on the active medication list: yes    Last refill: 01/04/22  15g  3 refills  Future visit scheduled With nurse  08/01/22  Notes to clinic: Not delegated, please review.  Requested Prescriptions  Pending Prescriptions Disp Refills   triamcinolone cream (KENALOG) 0.5 % [Pharmacy Med Name: TRIAMCINOLONE 0.5% CREAM] 15 g 3    Sig: APPLY TO AFFECTED AREA EVERY DAY AS NEEDED     Not Delegated - Dermatology:  Corticosteroids Failed - 07/18/2022  4:21 PM      Failed - This refill cannot be delegated      Passed - Valid encounter within last 12 months    Recent Outpatient Visits           10 months ago Type 2 diabetes mellitus with other specified complication, without long-term current use of insulin ([place])   [facility], Devonne Doughty, DO   1 year ago Type 2 diabetes mellitus with other specified complication, without long-term current use of insulin [facility])   [facility], Devonne Doughty, DO   1 year ago Type 2 diabetes mellitus with other specified complication, without long-term current use of insulin [facility])   [facility] Olin Hauser, DO   2 years ago Essential hypertension   [facility] Olin Hauser, DO   2 years ago COVID-32 virus infection   [facility], Lilia Argue, [place]       Future Appointments             In 1 week [facility], [facility]

## 2022-07-28 ENCOUNTER — Ambulatory Visit (INDEPENDENT_AMBULATORY_CARE_PROVIDER_SITE_OTHER): Payer: Medicare Other

## 2022-07-28 VITALS — BP 150/68 | Ht 68.0 in | Wt 218.4 lb

## 2022-07-28 DIAGNOSIS — Z0000 Encounter for general adult medical examination without abnormal findings: Secondary | ICD-10-CM | POA: Diagnosis not present

## 2022-07-28 NOTE — Patient Instructions (Signed)
[name] , Thank you for taking time to come for your Medicare Wellness Visit. I appreciate your ongoing commitment to your health goals. Please review the following plan we discussed and let me know if I can assist you in the future.   Screening recommendations/referrals: Colonoscopy: aged out Recommended yearly ophthalmology/optometry visit for glaucoma screening and checkup Recommended yearly dental visit for hygiene and checkup  Vaccinations: Influenza vaccine: n/c Pneumococcal vaccine: n/d Tdap vaccine: n/d Shingles vaccine: n/d   Covid-19: 12/22/19, 01/19/20  Advanced directives: no  Conditions/risks identified: none  Next appointment: Follow up in one year for your annual wellness visit. 08/03/23 @ 2:45pm in person  Preventive Care 77 Years and Older, Male Preventive care refers to lifestyle choices and visits with your health care provider that can promote health and wellness. What does preventive care include? A yearly physical exam. This is also called an annual well check. Dental exams once or twice a year. Routine eye exams. Ask your health care provider how often you should have your eyes checked. Personal lifestyle choices, including: Daily care of your teeth and gums. Regular physical activity. Eating a healthy diet. Avoiding tobacco and drug use. Limiting alcohol use. Practicing safe sex. Taking low doses of aspirin every day. Taking vitamin and mineral supplements as recommended by your health care provider. What happens during an annual well check? The services and screenings done by your health care provider during your annual well check will depend on your age, overall health, lifestyle risk factors, and family history of disease. Counseling  Your health care provider may ask you questions about your: Alcohol use. Tobacco use. Drug use. Emotional well-being. Home and relationship well-being. Sexual activity. Eating habits. History of falls. Memory and  ability to understand (cognition). Work and work Statistician. Screening  You may have the following tests or measurements: Height, weight, and BMI. Blood pressure. Lipid and cholesterol levels. These may be checked every 5 years, or more frequently if you are over 89 years old. Skin check. Lung cancer screening. You may have this screening every year starting at age 77 if you have a 30-pack-year history of smoking and currently smoke or have quit within the past 15 years. Fecal occult blood test (FOBT) of the stool. You may have this test every year starting at age 77. Flexible sigmoidoscopy or colonoscopy. You may have a sigmoidoscopy every 5 years or a colonoscopy every 10 years starting at age 77. Prostate cancer screening. Recommendations will vary depending on your family history and other risks. Hepatitis C blood test. Hepatitis B blood test. Sexually transmitted disease (STD) testing. Diabetes screening. This is done by checking your blood sugar (glucose) after you have not eaten for a while (fasting). You may have this done every 1-3 years. Abdominal aortic aneurysm (AAA) screening. You may need this if you are a current or former smoker. Osteoporosis. You may be screened starting at age 77 if you are at high risk. Talk with your health care provider about your test results, treatment options, and if necessary, the need for more tests. Vaccines  Your health care provider may recommend certain vaccines, such as: Influenza vaccine. This is recommended every year. Tetanus, diphtheria, and acellular pertussis (Tdap, Td) vaccine. You may need a Td booster every 10 years. Zoster vaccine. You may need this after age 79. Pneumococcal 13-valent conjugate (PCV13) vaccine. One dose is recommended after age 77 Pneumococcal polysaccharide (PPSV23) vaccine. One dose is recommended after age 77 Talk to your health care provider  about which screenings and vaccines you need and how often you need  them. This information is not intended to replace advice given to you by your health care provider. Make sure you discuss any questions you have with your health care provider. Document Released: 11/26/2015 Document Revised: 07/19/2016 Document Reviewed: 08/31/2015 Elsevier Interactive Patient Education  2017 [place] Prevention in the Home Falls can cause injuries. They can happen to people of all ages. There are many things you can do to make your home safe and to help prevent falls. What can I do on the outside of my home? Regularly fix the edges of walkways and driveways and fix any cracks. Remove anything that might make you trip as you walk through a door, such as a raised step or threshold. Trim any bushes or trees on the path to your home. Use bright outdoor lighting. Clear any walking paths of anything that might make someone trip, such as rocks or tools. Regularly check to see if handrails are loose or broken. Make sure that both sides of any steps have handrails. Any raised decks and porches should have guardrails on the edges. Have any leaves, snow, or ice cleared regularly. Use sand or salt on walking paths during winter. Clean up any spills in your garage right away. This includes oil or grease spills. What can I do in the bathroom? Use night lights. Install grab bars by the toilet and in the tub and shower. Do not use towel bars as grab bars. Use non-skid mats or decals in the tub or shower. If you need to sit down in the shower, use a plastic, non-slip stool. Keep the floor dry. Clean up any water that spills on the floor as soon as it happens. Remove soap buildup in the tub or shower regularly. Attach bath mats securely with double-sided non-slip rug tape. Do not have throw rugs and other things on the floor that can make you trip. What can I do in the bedroom? Use night lights. Make sure that you have a light by your bed that is easy to reach. Do not use  any sheets or blankets that are too big for your bed. They should not hang down onto the floor. Have a firm chair that has side arms. You can use this for support while you get dressed. Do not have throw rugs and other things on the floor that can make you trip. What can I do in the kitchen? Clean up any spills right away. Avoid walking on wet floors. Keep items that you use a lot in easy-to-reach places. If you need to reach something above you, use a strong step stool that has a grab bar. Keep electrical cords out of the way. Do not use floor polish or wax that makes floors slippery. If you must use wax, use non-skid floor wax. Do not have throw rugs and other things on the floor that can make you trip. What can I do with my stairs? Do not leave any items on the stairs. Make sure that there are handrails on both sides of the stairs and use them. Fix handrails that are broken or loose. Make sure that handrails are as long as the stairways. Check any carpeting to make sure that it is firmly attached to the stairs. Fix any carpet that is loose or worn. Avoid having throw rugs at the top or bottom of the stairs. If you do have throw rugs, attach them to the floor  with carpet tape. Make sure that you have a light switch at the top of the stairs and the bottom of the stairs. If you do not have them, ask someone to add them for you. What else can I do to help prevent falls? Wear shoes that: Do not have high heels. Have rubber bottoms. Are comfortable and fit you well. Are closed at the toe. Do not wear sandals. If you use a stepladder: Make sure that it is fully opened. Do not climb a closed stepladder. Make sure that both sides of the stepladder are locked into place. Ask someone to hold it for you, if possible. Clearly mark and make sure that you can see: Any grab bars or handrails. First and last steps. Where the edge of each step is. Use tools that help you move around (mobility aids)  if they are needed. These include: Canes. Walkers. Scooters. Crutches. Turn on the lights when you go into a dark area. Replace any light bulbs as soon as they burn out. Set up your furniture so you have a clear path. Avoid moving your furniture around. If any of your floors are uneven, fix them. If there are any pets around you, be aware of where they are. Review your medicines with your doctor. Some medicines can make you feel dizzy. This can increase your chance of falling. Ask your doctor what other things that you can do to help prevent falls. This information is not intended to replace advice given to you by your health care provider. Make sure you discuss any questions you have with your health care provider. Document Released: 08/26/2009 Document Revised: 04/06/2016 Document Reviewed: 12/04/2014 Elsevier Interactive Patient Education  2017 [employer].

## 2022-07-28 NOTE — Progress Notes (Signed)
Subjective:   Brady Sanchez is a 76 y.o. male who presents for Medicare Annual/Subsequent preventive examination.  Review of Systems           Objective:    Today's Vitals   07/28/22 1132  BP: (!) 150/68  Weight: 218 lb 6.4 oz (99.1 kg)  Height: '5\' 8"'$  (1.727 m)   Body mass index is 33.21 kg/m.     07/26/2021    1:26 PM 08/06/2019    1:27 AM 11/13/2018    5:41 PM 11/13/2018   10:56 AM 07/05/2018    6:42 AM  Advanced Directives  Does Patient Have a Medical Advance Directive? No No No No No  Would patient like information on creating a medical advance directive?  No - Patient declined No - Patient declined  No - Patient declined    Current Medications (verified) Outpatient Encounter Medications as of 07/28/2022  Medication Sig   Albuterol Sulfate (PROAIR RESPICLICK) 144 (90 Base) MCG/ACT AEPB Inhale 2 puffs into the lungs every 4 (four) hours as needed. (Patient not taking: No sig reported)   amLODipine (NORVASC) 10 MG tablet Take 1 tablet (10 mg total) by mouth daily.   atorvastatin (LIPITOR) 20 MG tablet Take 1 tablet (20 mg total) by mouth daily.   fluticasone (FLONASE) 50 MCG/ACT nasal spray PLACE 2 SPRAYS INTO BOTH NOSTRILS DAILY. USE FOR 4-6 WEEKS THEN STOP AND USE SEASONALLY OR AS NEEDED. (Patient not taking: No sig reported)   hydrochlorothiazide (HYDRODIURIL) 25 MG tablet Take 1 tablet (25 mg total) by mouth daily.   lisinopril (ZESTRIL) 40 MG tablet Take 1 tablet (40 mg total) by mouth daily.   metFORMIN (GLUCOPHAGE) 1000 MG tablet TAKE 1 TABLET (1,000 MG TOTAL) BY MOUTH 2 (TWO) TIMES DAILY WITH A MEAL.   metoprolol succinate (TOPROL-XL) 50 MG 24 hr tablet Take 1 tablet (50 mg total) by mouth daily. for high blood pressure   triamcinolone cream (KENALOG) 0.5 % APPLY TO AFFECTED AREA EVERY DAY AS NEEDED   No facility-administered encounter medications on file as of 07/28/2022.    Allergies (verified) Dm-doxylamine-acetaminophen   History: Past Medical History:   Diagnosis Date   Hyperlipidemia    Hypertension    Past Surgical History:  Procedure Laterality Date   CATARACT EXTRACTION     REPLACEMENT TOTAL KNEE     Family History  Problem Relation Age of Onset   Seizures Mother    Cancer Father        lung   Social History   Socioeconomic History   Marital status: Widowed    Spouse name: Not on file   Number of children: Not on file   Years of education: Not on file   Highest education level: Not on file  Occupational History   Occupation: works at [employer] care facility  Tobacco Use   Smoking status: Never   Smokeless tobacco: Never  Vaping Use   Vaping Use: Never used  Substance and Sexual Activity   Alcohol use: Yes   Drug use: No   Sexual activity: Not on file  Other Topics Concern   Not on file  Social History Narrative   Not on file   Social Determinants of Health   Financial Resource Strain: Low Risk  (07/26/2021)   Overall Financial Resource Strain (CARDIA)    Difficulty of Paying Living Expenses: Not hard at all  Food Insecurity: No Food Insecurity (07/26/2021)   Hunger Vital Sign    Worried About Running Out  of Food in the Last Year: Never true    [place] in the Last Year: Never true  Transportation Needs: No Transportation Needs (07/26/2021)   PRAPARE - Hydrologist (Medical): No    Lack of Transportation (Non-Medical): No  Physical Activity: Inactive (07/26/2021)   Exercise Vital Sign    Days of Exercise per Week: 0 days    Minutes of Exercise per Session: 0 min  Stress: No Stress Concern Present (07/26/2021)   [place]    Feeling of Stress : Not at all  Social Connections: Not on file    Tobacco Counseling Counseling given: Not Answered   Clinical Intake:  Pre-visit preparation completed: Yes  Pain : No/denies pain     Nutritional Risks: None Diabetes: Yes CBG done?: No Did pt.  bring in CBG monitor from home?: No  How often do you need to have someone help you when you read instructions, pamphlets, or other written materials from your doctor or pharmacy?: 1 - Never  Diabetic?yes Nutrition Risk Assessment:  Has the patient had any N/V/D within the last 2 months?  Yes  Does the patient have any non-healing wounds?  No  Has the patient had any unintentional weight loss or weight gain?  No   Diabetes:  Is the patient diabetic?  Yes  If diabetic, was a CBG obtained today?  No  Did the patient bring in their glucometer from home?  No  How often do you monitor your CBG's? never.   Financial Strains and Diabetes Management:  Are you having any financial strains with the device, your supplies or your medication? No .  Does the patient want to be seen by Chronic Care Management for management of their diabetes?  No  Would the patient like to be referred to a Nutritionist or for Diabetic Management?  No   Diabetic Exams:  Diabetic Eye Exam: Completed 06/23/19. Pt has been advised about the importance in completing this exam.   Diabetic Foot Exam: Completed 01/25/21. Pt has been advised about the importance in completing this exam.   Interpreter Needed?: No  Information entered by :: Kirke Shaggy, LPN   Activities of Daily Living     No data to display          Patient Care Team: Olin Hauser, DO as PCP - General (Family Medicine)  Indicate any recent Medical Services you may have received from other than [facility] providers in the past year (date may be approximate).     Assessment:   This is a routine wellness examination for [place].  Hearing/Vision screen No results found.  Dietary issues and exercise activities discussed:     Goals Addressed   None    Depression Screen    09/13/2021    1:21 PM 07/26/2021    1:27 PM 09/21/2020    8:08 AM 06/18/2020    8:23 AM 12/19/2019    8:08 AM 08/19/2019    9:43 AM 04/10/2019    8:28 AM  PHQ  2/9 Scores  PHQ - 2 Score 0 0 0 0 0 0 0  PHQ- 9 Score 0     0     Fall Risk    09/13/2021    1:21 PM 07/26/2021    1:27 PM 09/21/2020    8:08 AM 06/18/2020    8:23 AM 12/19/2019    8:07 AM  [place]  in the past year? 0 0 0 0 0  Number falls in past yr: 0  0 0 0  Injury with Fall? 0  0 0 0  Risk for fall due to :  Medication side effect     Follow up Falls evaluation completed Falls evaluation completed;Education provided;Falls prevention discussed Falls evaluation completed Falls evaluation completed Falls evaluation completed    FALL RISK PREVENTION PERTAINING TO THE HOME:  Any stairs in or around the home? Yes  If so, are there any without handrails? No  Home free of loose throw rugs in walkways, pet beds, electrical cords, etc? Yes  Adequate lighting in your home to reduce risk of falls? Yes   ASSISTIVE DEVICES UTILIZED TO PREVENT FALLS:  Life alert? No  Use of a cane, walker or w/c? No  Grab bars in the bathroom? No  Shower chair or bench in shower? No  Elevated toilet seat or a handicapped toilet? No   TIMED UP AND GO:  Was the test performed? Yes .  Length of time to ambulate 10 feet: 4 sec.   Gait steady and fast without use of assistive device  Cognitive Function:        07/26/2021    1:28 PM  6CIT Screen  What Year? 0 points  What month? 0 points  What time? 0 points  Count back from 20 0 points  Months in reverse 4 points  Repeat phrase 6 points  Total Score 10 points    Immunizations Immunization History  Administered Date(s) Administered   Fluad Quad(high Dose 65+) 08/19/2019   Moderna Sars-Covid-2 Vaccination 12/22/2019, 01/19/2020    TDAP status: Due, Education has been provided regarding the importance of this vaccine. Advised may receive this vaccine at local pharmacy or Health Dept. Aware to provide a copy of the vaccination record if obtained from local pharmacy or Health Dept. Verbalized acceptance and understanding.  Flu Vaccine  status: Declined, Education has been provided regarding the importance of this vaccine but patient still declined. Advised may receive this vaccine at local pharmacy or Health Dept. Aware to provide a copy of the vaccination record if obtained from local pharmacy or Health Dept. Verbalized acceptance and understanding.  Pneumococcal vaccine status: Declined,  Education has been provided regarding the importance of this vaccine but patient still declined. Advised may receive this vaccine at local pharmacy or Health Dept. Aware to provide a copy of the vaccination record if obtained from local pharmacy or Health Dept. Verbalized acceptance and understanding.   Covid-19 vaccine status: Completed vaccines  Qualifies for Shingles Vaccine? Yes   Zostavax completed No   Shingrix Completed?: No.    Education has been provided regarding the importance of this vaccine. Patient has been advised to call insurance company to determine out of pocket expense if they have not yet received this vaccine. Advised may also receive vaccine at local pharmacy or Health Dept. Verbalized acceptance and understanding.  Screening Tests Health Maintenance  Topic Date Due   Diabetic kidney evaluation - Urine ACR  Never done   TETANUS/TDAP  Never done   Zoster Vaccines- Shingrix (1 of 2) Never done   COVID-19 Vaccine (3 - Moderna series) 03/15/2020   OPHTHALMOLOGY EXAM  06/22/2020   FOOT EXAM  01/25/2022   HEMOGLOBIN A1C  03/13/2022   INFLUENZA VACCINE  06/13/2022   Pneumonia Vaccine 17+ Years old (1 - PCV) 09/13/2022 (Originally 08/11/2010)   Diabetic kidney evaluation - GFR measurement  09/13/2022   Hepatitis C  Screening  Completed   HPV VACCINES  Aged Out   Fecal DNA (Cologuard)  Discontinued    Health Maintenance  Health Maintenance Due  Topic Date Due   Diabetic kidney evaluation - Urine ACR  Never done   TETANUS/TDAP  Never done   Zoster Vaccines- Shingrix (1 of 2) Never done   COVID-19 Vaccine (3 -  Moderna series) 03/15/2020   OPHTHALMOLOGY EXAM  06/22/2020   FOOT EXAM  01/25/2022   HEMOGLOBIN A1C  03/13/2022   INFLUENZA VACCINE  06/13/2022    Colorectal cancer screening: No longer required.   Lung Cancer Screening: (Low Dose CT Chest recommended if Age 60-80 years, 30 pack-year currently smoking OR have quit w/in 15years.) does not qualify.    Additional Screening:  Hepatitis C Screening: does qualify; Completed 04/03/19  Vision Screening: Recommended annual ophthalmology exams for early detection of glaucoma and other disorders of the eye. Is the patient up to date with their annual eye exam?  Yes  Who is the provider or what is the name of the office in which the patient attends annual eye exams? Dr.Nice If pt is not established with a provider, would they like to be referred to a provider to establish care? No .   Dental Screening: Recommended annual dental exams for proper oral hygiene  Community Resource Referral / Chronic Care Management: CRR required this visit?  No   CCM required this visit?  No      Plan:     I have personally reviewed and noted the following in the patient's chart:   Medical and social history Use of alcohol, tobacco or illicit drugs  Current medications and supplements including opioid prescriptions. Patient is not currently taking opioid prescriptions. Functional ability and status Nutritional status Physical activity Advanced directives List of other physicians Hospitalizations, surgeries, and ER visits in previous 12 months Vitals Screenings to include cognitive, depression, and falls Referrals and appointments  In addition, I have reviewed and discussed with patient certain preventive protocols, quality metrics, and best practice recommendations. A written personalized care plan for preventive services as well as general preventive health recommendations were provided to patient.     Dionisio David, LPN   03/21/3266   Nurse  Notes:

## 2022-08-01 ENCOUNTER — Ambulatory Visit: Payer: Medicare Other

## 2022-09-07 ENCOUNTER — Other Ambulatory Visit: Payer: Self-pay | Admitting: Family Medicine

## 2022-09-07 DIAGNOSIS — I10 Essential (primary) hypertension: Secondary | ICD-10-CM

## 2022-09-07 NOTE — Telephone Encounter (Signed)
Requested Prescriptions  Pending Prescriptions Disp Refills  . amLODipine (NORVASC) 10 MG tablet [Pharmacy Med Name: AMLODIPINE BESYLATE 10 MG TAB] 90 tablet 0    Sig: TAKE 1 TABLET BY MOUTH EVERY DAY     Cardiovascular: Calcium Channel Blockers 2 Failed - 09/07/2022  9:32 AM      Failed - Last BP in normal range    BP Readings from Last 1 Encounters:  07/28/22 (!) 150/68         Passed - Last Heart Rate in normal range    Pulse Readings from Last 1 Encounters:  09/13/21 (!) 58         Passed - Valid encounter within last 6 months    Recent Outpatient Visits          11 months ago Type 2 diabetes mellitus with other specified complication, without long-term current use of insulin ([place])   [facility] [place], Devonne Doughty, DO   1 year ago Type 2 diabetes mellitus with other specified complication, without long-term current use of insulin [facility])   [facility] [place], Devonne Doughty, DO   1 year ago Type 2 diabetes mellitus with other specified complication, without long-term current use of insulin [facility])   [facility] Olin Hauser, DO   2 years ago Essential hypertension   [facility] Olin Hauser, DO   2 years ago COVID-62 virus infection   [facility], [employer] [facility] [employer], [place]

## 2022-09-21 ENCOUNTER — Other Ambulatory Visit: Payer: Self-pay | Admitting: Family Medicine

## 2022-09-21 DIAGNOSIS — I10 Essential (primary) hypertension: Secondary | ICD-10-CM

## 2022-09-21 NOTE — Telephone Encounter (Signed)
Requested Prescriptions  Pending Prescriptions Disp Refills   metoprolol succinate (TOPROL-XL) 50 MG 24 hr tablet [Pharmacy Med Name: METOPROLOL SUCC ER 50 MG TAB] 90 tablet 0    Sig: TAKE 1 TABLET (50 MG TOTAL) BY MOUTH DAILY. FOR HIGH BLOOD PRESSURE     Cardiovascular:  Beta Blockers Failed - 09/21/2022  8:30 AM      Failed - Last BP in normal range    BP Readings from Last 1 Encounters:  07/28/22 (!) 150/68         Passed - Last Heart Rate in normal range    Pulse Readings from Last 1 Encounters:  09/13/21 (!) 93         Passed - Valid encounter within last 6 months    Recent Outpatient Visits           1 year ago Type 2 diabetes mellitus with other specified complication, without long-term current use of insulin ([place])   [facility] [place], Devonne Doughty, DO   1 year ago Type 2 diabetes mellitus with other specified complication, without long-term current use of insulin [facility])   [facility] Devonne Doughty, DO   2 years ago Type 2 diabetes mellitus with other specified complication, without long-term current use of insulin [facility])   [facility] Olin Hauser, DO   2 years ago Essential hypertension   [facility] Olin Hauser, DO   2 years ago COVID-45 virus infection   [facility], [place], [place]

## 2022-09-25 ENCOUNTER — Other Ambulatory Visit: Payer: Self-pay | Admitting: Family Medicine

## 2022-09-25 DIAGNOSIS — I10 Essential (primary) hypertension: Secondary | ICD-10-CM

## 2022-09-25 NOTE — Telephone Encounter (Signed)
Patient called, left VM to return the call to the office to scheduled an appt for medication refill request.   

## 2022-09-25 NOTE — Telephone Encounter (Signed)
Requested medication (s) are due for refill today: yes  Requested medication (s) are on the active medication list: yes  Last refill:  09/13/21 #90/3  Future visit scheduled: no  Notes to clinic:  Unable to refill per protocol due to failed labs, no updated results.      Requested Prescriptions  Pending Prescriptions Disp Refills   hydrochlorothiazide (HYDRODIURIL) 25 MG tablet [Pharmacy Med Name: HYDROCHLOROTHIAZIDE 25 MG TAB] 90 tablet 3    Sig: Take 1 tablet (25 mg total) by mouth daily.     Cardiovascular: Diuretics - Thiazide Failed - 09/25/2022  1:54 AM      Failed - Cr in normal range and within 180 days    Creat  Date Value Ref Range Status  09/13/2021 1.00 0.70 - 1.28 mg/dL Final         Failed - K in normal range and within 180 days    Potassium  Date Value Ref Range Status  09/13/2021 3.8 3.5 - 5.3 mmol/L Final  01/30/2012 3.6 3.5 - 5.1 mmol/L Final         Failed - Na in normal range and within 180 days    Sodium  Date Value Ref Range Status  09/13/2021 136 135 - 146 mmol/L Final  01/30/2012 141 136 - 145 mmol/L Final         Failed - Last BP in normal range    BP Readings from Last 1 Encounters:  07/28/22 (!) 150/68         Passed - Valid encounter within last 6 months    Recent Outpatient Visits           1 year ago Type 2 diabetes mellitus with other specified complication, without long-term current use of insulin ([place])   [facility] Olin Hauser, DO   1 year ago Type 2 diabetes mellitus with other specified complication, without long-term current use of insulin ([place])   [facility], Devonne Doughty, DO   2 years ago Type 2 diabetes mellitus with other specified complication, without long-term current use of insulin ([place])   [place], Devonne Doughty, DO   2 years ago Essential hypertension   [place], Devonne Doughty, DO   2 years ago COVID-61  virus infection   [facility], [place], [place]               lisinopril (ZESTRIL) 40 MG tablet [Pharmacy Med Name: LISINOPRIL 40 MG TABLET] 90 tablet 3    Sig: TAKE 1 TABLET BY MOUTH EVERY DAY     Cardiovascular:  ACE Inhibitors Failed - 09/25/2022  1:54 AM      Failed - Cr in normal range and within 180 days    Creat  Date Value Ref Range Status  09/13/2021 1.00 0.70 - 1.28 mg/dL Final         Failed - K in normal range and within 180 days    Potassium  Date Value Ref Range Status  09/13/2021 3.8 3.5 - 5.3 mmol/L Final  01/30/2012 3.6 3.5 - 5.1 mmol/L Final         Failed - Last BP in normal range    BP Readings from Last 1 Encounters:  07/28/22 (!) 150/68         Passed - Patient is not pregnant      Passed - Valid encounter within last 6 months    Recent Outpatient  Visits           1 year ago Type 2 diabetes mellitus with other specified complication, without long-term current use of insulin ([place])   [facility], Devonne Doughty, DO   1 year ago Type 2 diabetes mellitus with other specified complication, without long-term current use of insulin [facility])   [facility], Devonne Doughty, DO   2 years ago Type 2 diabetes mellitus with other specified complication, without long-term current use of insulin [facility])   [facility] Olin Hauser, DO   2 years ago Essential hypertension   [facility] Olin Hauser, DO   2 years ago COVID-30 virus infection   [facility], [place], [place]

## 2022-09-26 ENCOUNTER — Other Ambulatory Visit: Payer: Self-pay | Admitting: Family Medicine

## 2022-09-26 DIAGNOSIS — E1169 Type 2 diabetes mellitus with other specified complication: Secondary | ICD-10-CM

## 2022-09-26 NOTE — Telephone Encounter (Signed)
Requested Prescriptions  Pending Prescriptions Disp Refills   atorvastatin (LIPITOR) 20 MG tablet [Pharmacy Med Name: ATORVASTATIN 20 MG TABLET] 90 tablet 0    Sig: TAKE 1 TABLET BY MOUTH EVERY DAY     Cardiovascular:  Antilipid - Statins Failed - 09/26/2022 11:30 AM      Failed - Lipid Panel in normal range within the last 12 months    Cholesterol  Date Value Ref Range Status  09/13/2021 141 <200 mg/dL Final   LDL Cholesterol (Calc)  Date Value Ref Range Status  09/13/2021 79 mg/dL (calc) Final    Comment:    Reference range: <100 . Desirable range <100 mg/dL for primary prevention;   <70 mg/dL for patients with CHD or diabetic patients  with > or = 2 CHD risk factors. Marland Kitchen LDL-C is now calculated using the Martin-Hopkins  calculation, which is a validated novel method providing  better accuracy than the Friedewald equation in the  estimation of LDL-C.  Cresenciano Genre et al. Annamaria Helling. 1941;740(81): 2061-2068  (http://education.[employer].com/faq/FAQ164)    HDL  Date Value Ref Range Status  09/13/2021 45 > OR = 40 mg/dL Final   Triglycerides  Date Value Ref Range Status  09/13/2021 90 <150 mg/dL Final         Passed - Patient is not pregnant      Passed - Valid encounter within last 12 months    Recent Outpatient Visits           1 year ago Type 2 diabetes mellitus with other specified complication, without long-term current use of insulin ([place])   [facility], Devonne Doughty, DO   1 year ago Type 2 diabetes mellitus with other specified complication, without long-term current use of insulin [facility])   [facility], Devonne Doughty, DO   2 years ago Type 2 diabetes mellitus with other specified complication, without long-term current use of insulin [facility])   [facility] Olin Hauser, DO   2 years ago Essential hypertension   [facility] Olin Hauser, DO   2 years ago  COVID-56 virus infection   [facility], [place], [place]

## 2022-10-09 ENCOUNTER — Encounter: Payer: Self-pay | Admitting: Family Medicine

## 2022-10-09 ENCOUNTER — Ambulatory Visit (INDEPENDENT_AMBULATORY_CARE_PROVIDER_SITE_OTHER): Payer: Medicare Other | Admitting: Family Medicine

## 2022-10-09 VITALS — BP 134/70 | HR 65 | Ht 68.0 in | Wt 217.2 lb

## 2022-10-09 DIAGNOSIS — M159 Polyosteoarthritis, unspecified: Secondary | ICD-10-CM | POA: Diagnosis not present

## 2022-10-09 DIAGNOSIS — I10 Essential (primary) hypertension: Secondary | ICD-10-CM | POA: Diagnosis not present

## 2022-10-09 DIAGNOSIS — E669 Obesity, unspecified: Secondary | ICD-10-CM

## 2022-10-09 DIAGNOSIS — R351 Nocturia: Secondary | ICD-10-CM

## 2022-10-09 DIAGNOSIS — E785 Hyperlipidemia, unspecified: Secondary | ICD-10-CM

## 2022-10-09 DIAGNOSIS — E1169 Type 2 diabetes mellitus with other specified complication: Secondary | ICD-10-CM

## 2022-10-09 LAB — POCT GLYCOSYLATED HEMOGLOBIN (HGB A1C): Hemoglobin A1C: 6.9 % — AB (ref 4.0–5.6)

## 2022-10-09 MED ORDER — ATORVASTATIN CALCIUM 20 MG PO TABS: 20.0000 mg | ORAL_TABLET | Freq: Every day | ORAL | 3 refills | Status: DC

## 2022-10-09 MED ORDER — METOPROLOL SUCCINATE ER 50 MG PO TB24: 50.0000 mg | ORAL_TABLET | Freq: Every day | ORAL | 3 refills | Status: DC

## 2022-10-09 MED ORDER — METFORMIN HCL 1000 MG PO TABS: 1000.0000 mg | ORAL_TABLET | Freq: Two times a day (BID) | ORAL | 3 refills | Status: DC

## 2022-10-09 MED ORDER — AMLODIPINE BESYLATE 10 MG PO TABS: 10.0000 mg | ORAL_TABLET | Freq: Every day | ORAL | 3 refills | Status: DC

## 2022-10-09 NOTE — Assessment & Plan Note (Signed)
Controlled cholesterol on statin and lifestyle Due for repeat lipid panel  Plan: 1. Continue current meds - Atorvastatin '20mg'$  2. Encourage improved lifestyle - low carb/cholesterol, reduce portion size, continue improving regular exercise

## 2022-10-09 NOTE — Assessment & Plan Note (Signed)
Improved HTN on current meds Home BP readings none available  No known complications    Plan:  1. Continue Amlodipine '10mg'$  daily, Lisinopril '40mg'$  daily, HCTZ '25mg'$  daily, Metoprolol XL '50mg'$  daily 2. Encourage improved lifestyle - emphasized importance of low sodium diet, regular exercise 3. Keep monitor BP outside office, bring readings to next visit, if persistently >140/90 or new symptoms notify office sooner

## 2022-10-09 NOTE — Progress Notes (Signed)
Subjective:    Patient ID: Brady Sanchez, male    DOB: 1946-10-05, 76 y.o.   MRN: [id number]  Brady Sanchez is a 76 y.o. male presenting on 10/09/2022 for Diabetes and Hypertension   HPI  CHRONIC DM, Type 2 / Obesity BMI >33 Due for labs and A1c No new concerns. Not checking sugar regularly. CBGs: none recently Meds: Metformin '1000mg'$  BID Reports good compliance. Tolerating well w/o side-effects Currently on ACEi Lifestyle:  - Diet (low sugar low carb diet, trying to improve)  - Exercise (limited) S/p DM Eye Exam Dr Matilde Sprang 06/23/19. He is s/p cataract removal surgery 9/17 and 9/27 with improved vision able to read more, less blurry. He will work on returning to [employer] for DM eye exam, he changed jobs and issue with insurance coverage. He has not been back to [facility]. Denies hypoglycemia, polyuria, visual changes, numbness or tingling.   CHRONIC HTN: Not checking BP at home Improved BP here on repeat check. He says no particular other reason for Metoprolol. No heart history, no tachycardia or MI. Current Meds - Amlodipine '10mg'$  daily, Lisinopril '40mg'$  daily, HCTZ '25mg'$  daily, Metoprolol XL '50mg'$  daily Reports good compliance, took meds today. Tolerating well, w/o complaints. Denies CP, dyspnea, edema, dizziness / lightheadedness, HA   HYPERLIPIDEMIA: - Reports no concerns. Last lipid panel 09/2021, controlled on statin - Currently taking Atorvastatin '20mg'$ , tolerating well without side effects or myalgias Due for lab today  Health Maintenance:   Due for Flu Shot, declines today despite counseling on benefits     07/28/2022   11:39 AM 09/13/2021    1:21 PM 07/26/2021    1:27 PM  Depression screen PHQ 2/9  Decreased Interest 0 0 0  Down, Depressed, Hopeless 0 0 0  PHQ - 2 Score 0 0 0  Altered sleeping 0 0   Tired, decreased energy 0 0   Change in appetite 0 0   Feeling bad or failure about yourself  0 0   Trouble concentrating 0 0   Moving slowly or  fidgety/restless 0 0   Suicidal thoughts 0 0   PHQ-9 Score 0 0   Difficult doing work/chores Not difficult at all Not difficult at all     Past Medical History:  Diagnosis Date   Hyperlipidemia    Hypertension    Past Surgical History:  Procedure Laterality Date   CATARACT EXTRACTION     REPLACEMENT TOTAL KNEE     Social History   Socioeconomic History   Marital status: Widowed    Spouse name: Not on file   Number of children: Not on file   Years of education: Not on file   Highest education level: Not on file  Occupational History   Occupation: works at [employer]  Tobacco Use   Smoking status: Never   Smokeless tobacco: Never  Vaping Use   Vaping Use: Never used  Substance and Sexual Activity   Alcohol use: Yes   Drug use: No   Sexual activity: Not on file  Other Topics Concern   Not on file  Social History Narrative   Not on file   Social Determinants of Health   Financial Resource Strain: [place]  (07/28/2022)   Overall Financial Resource Strain (CARDIA)    Difficulty of Paying Living Expenses: Not very hard  Food Insecurity: No Food Insecurity (07/28/2022)   Hunger Vital Sign    Worried About Running Out of Food in the Last Year: Never true  Ran Out of Food in the Last Year: Never true  Transportation Needs: No Transportation Needs (07/28/2022)   PRAPARE - Hydrologist (Medical): No    Lack of Transportation (Non-Medical): No  Physical Activity: Insufficiently Active (07/28/2022)   Exercise Vital Sign    Days of Exercise per Week: 4 days    Minutes of Exercise per Session: 30 min  Stress: No Stress Concern Present (07/28/2022)   [place]    Feeling of Stress : Not at all  Social Connections: Socially Isolated (07/28/2022)   Social Connection and Isolation Panel [NHANES]    Frequency of Communication with Friends and Family: More than three  times a week    Frequency of Social Gatherings with Friends and Family: More than three times a week    Attends Religious Services: Never    Marine scientist or Organizations: No    Attends Archivist Meetings: Never    Marital Status: Widowed  Intimate Partner Violence: Not At Risk (07/28/2022)   Humiliation, Afraid, Rape, and Kick questionnaire    Fear of Current or Ex-Partner: No    Emotionally Abused: No    Physically Abused: No    Sexually Abused: No   Family History  Problem Relation Age of Onset   Seizures Mother    Cancer Father        lung   Current Outpatient Medications on File Prior to Visit  Medication Sig   hydrochlorothiazide (HYDRODIURIL) 25 MG tablet TAKE 1 TABLET (25 MG TOTAL) BY MOUTH DAILY.   lisinopril (ZESTRIL) 40 MG tablet TAKE 1 TABLET BY MOUTH EVERY DAY   triamcinolone cream (KENALOG) 0.5 % APPLY TO AFFECTED AREA EVERY DAY AS NEEDED   Albuterol Sulfate (PROAIR RESPICLICK) 654 (90 Base) MCG/ACT AEPB Inhale 2 puffs into the lungs every 4 (four) hours as needed. (Patient not taking: Reported on 07/26/2021)   No current facility-administered medications on file prior to visit.    Review of Systems  Constitutional:  Negative for activity change, appetite change, chills, diaphoresis, fatigue and fever.  HENT:  Negative for congestion and hearing loss.   Eyes:  Negative for visual disturbance.  Respiratory:  Negative for cough, chest tightness, shortness of breath and wheezing.   Cardiovascular:  Negative for chest pain, palpitations and leg swelling.  Gastrointestinal:  Negative for abdominal pain, constipation, diarrhea, nausea and vomiting.  Genitourinary:  Negative for dysuria, frequency and hematuria.  Musculoskeletal:  Negative for arthralgias and neck pain.  Skin:  Negative for rash.  Neurological:  Negative for dizziness, weakness, light-headedness, numbness and headaches.  Hematological:  Negative for adenopathy.   Psychiatric/Behavioral:  Negative for behavioral problems, dysphoric mood and sleep disturbance.    Per HPI unless specifically indicated above      Objective:    BP 134/70 (BP Location: Left Arm, Cuff Size: Normal)   Pulse 65   Ht '5\' 8"'$  (1.727 m)   Wt 217 lb 3.2 oz (98.5 kg)   SpO2 97%   BMI 33.03 kg/m   Wt Readings from Last 3 Encounters:  10/09/22 217 lb 3.2 oz (98.5 kg)  07/28/22 218 lb 6.4 oz (99.1 kg)  09/13/21 219 lb 3.2 oz (99.4 kg)    Physical Exam Vitals and nursing note reviewed.  Constitutional:      General: He is not in acute distress.    Appearance: He is well-developed. He is not diaphoretic.  Comments: Well-appearing, comfortable, cooperative  HENT:     Head: Normocephalic and atraumatic.  Eyes:     General:        Right eye: No discharge.        Left eye: No discharge.     Conjunctiva/sclera: Conjunctivae normal.     Pupils: Pupils are equal, round, and reactive to light.  Neck:     Thyroid: No thyromegaly.     Vascular: No carotid bruit.  Cardiovascular:     Rate and Rhythm: Normal rate and regular rhythm.     Pulses: Normal pulses.     Heart sounds: Normal heart sounds. No murmur heard. Pulmonary:     Effort: Pulmonary effort is normal. No respiratory distress.     Breath sounds: Normal breath sounds. No wheezing or rales.  Abdominal:     General: Bowel sounds are normal. There is no distension.     Palpations: Abdomen is soft. There is no mass.     Tenderness: There is no abdominal tenderness.  Musculoskeletal:        General: No tenderness. Normal range of motion.     Cervical back: Normal range of motion and neck supple.     Right lower leg: No edema.     Left lower leg: No edema.     Comments: Upper / Lower Extremities: - Normal muscle tone, strength bilateral upper extremities 5/5, lower extremities 5/5  Lymphadenopathy:     Cervical: No cervical adenopathy.  Skin:    General: Skin is warm and dry.     Findings: No erythema or  rash.  Neurological:     Mental Status: He is alert and oriented to person, place, and time.     Comments: Distal sensation intact to light touch all extremities  Psychiatric:        Mood and Affect: Mood normal.        Behavior: Behavior normal.        Thought Content: Thought content normal.     Comments: Well groomed, good eye contact, normal speech and thoughts     Diabetic Foot Exam - Simple   Simple Foot Form Diabetic Foot exam was performed with the following findings: Yes 10/09/2022  9:41 AM  Visual Inspection See comments: Yes Sensation Testing Intact to touch and monofilament testing bilaterally: Yes Pulse Check Posterior Tibialis and Dorsalis pulse intact bilaterally: Yes Comments Mild callus formation bilateral forefoot. Intact monofilament testing.      Results for orders placed or performed in visit on 10/09/22  POCT HgB A1C  Result Value Ref Range   Hemoglobin A1C 6.9 (A) 4.0 - 5.6 %      Assessment & Plan:   Problem List Items Addressed This Visit     Essential hypertension    Improved HTN on current meds Home BP readings none available  No known complications    Plan:  1. Continue Amlodipine '10mg'$  daily, Lisinopril '40mg'$  daily, HCTZ '25mg'$  daily, Metoprolol XL '50mg'$  daily 2. Encourage improved lifestyle - emphasized importance of low sodium diet, regular exercise 3. Keep monitor BP outside office, bring readings to next visit, if persistently >140/90 or new symptoms notify office sooner      Relevant Medications   amLODipine (NORVASC) 10 MG tablet   atorvastatin (LIPITOR) 20 MG tablet   metoprolol succinate (TOPROL-XL) 50 MG 24 hr tablet   Other Relevant Orders   COMPLETE METABOLIC PANEL WITH GFR   CBC with Differential/Platelet   Hyperlipidemia associated with type 2  diabetes mellitus ([place])    Controlled cholesterol on statin and lifestyle Due for repeat lipid panel  Plan: 1. Continue current meds - Atorvastatin '20mg'$  2. Encourage improved  lifestyle - low carb/cholesterol, reduce portion size, continue improving regular exercise      Relevant Medications   amLODipine (NORVASC) 10 MG tablet   atorvastatin (LIPITOR) 20 MG tablet   metFORMIN (GLUCOPHAGE) 1000 MG tablet   metoprolol succinate (TOPROL-XL) 50 MG 24 hr tablet   Other Relevant Orders   Lipid panel   TSH   Obesity (BMI 30.0-34.9)   Osteoarthritis of multiple joints   Type 2 diabetes mellitus with other specified complication (HCC) - Primary    A1c today 6.9 controlled No hypoglycemia or hyperglycemia Complications - hyperlipidemia, obesity, hypothyroidism, - increases risk of future cardiovascular complications   Plan:  1. Continue current therapy - Metformin '1000mg'$  BID 2. Encourage improved lifestyle - low carb, low sugar diet, reduce portion size, continue improving regular exercise 3. Check CBG , bring log to next visit for review 4. Continue ACEi, Statin  Need updated DM eye exam Dr Matilde Sprang - he will work on scheduling      Relevant Medications   atorvastatin (LIPITOR) 20 MG tablet   metFORMIN (GLUCOPHAGE) 1000 MG tablet   Other Relevant Orders   POCT HgB A1C (Completed)   COMPLETE METABOLIC PANEL WITH GFR   Urine Microalbumin w/creat. ratio   Other Visit Diagnoses     Nocturia       Relevant Orders   PSA       Updated Health Maintenance information Non Fasting lab ordered today. Encouraged improvement to lifestyle with diet and exercise Goal of weight loss  Orders Placed This Encounter  Procedures   COMPLETE METABOLIC PANEL WITH GFR   CBC with Differential/Platelet   Lipid panel    Order Specific Question:   Has the patient fasted?    Answer:   No   PSA   TSH   Urine Microalbumin w/creat. ratio   POCT HgB A1C     Meds ordered this encounter  Medications   amLODipine (NORVASC) 10 MG tablet    Sig: Take 1 tablet (10 mg total) by mouth daily.    Dispense:  90 tablet    Refill:  3    Add refills   atorvastatin (LIPITOR) 20 MG  tablet    Sig: Take 1 tablet (20 mg total) by mouth daily.    Dispense:  90 tablet    Refill:  3    Add refills   metFORMIN (GLUCOPHAGE) 1000 MG tablet    Sig: Take 1 tablet (1,000 mg total) by mouth 2 (two) times daily with a meal.    Dispense:  180 tablet    Refill:  3    Add refills   metoprolol succinate (TOPROL-XL) 50 MG 24 hr tablet    Sig: Take 1 tablet (50 mg total) by mouth daily. for high blood pressure    Dispense:  90 tablet    Refill:  3    Add refills      Follow up plan: Return in about 1 year (around 10/10/2023) for 1 year fasting lab only then 1 week later Annual Physical.  Future labs  Nobie Putnam, [facility] 10/09/2022, 9:36 AM

## 2022-10-09 NOTE — Assessment & Plan Note (Addendum)
A1c today 6.9 controlled No hypoglycemia or hyperglycemia Complications - hyperlipidemia, obesity, hypothyroidism, - increases risk of future cardiovascular complications   Plan:  1. Continue current therapy - Metformin '1000mg'$  BID 2. Encourage improved lifestyle - low carb, low sugar diet, reduce portion size, continue improving regular exercise 3. Check CBG , bring log to next visit for review 4. Continue ACEi, Statin  Need updated DM eye exam Dr Matilde Sprang - he will work on scheduling

## 2022-10-09 NOTE — Patient Instructions (Addendum)
Thank you for coming to the office today.  Keep up the good work.  BP is improved on recheck  Recent Labs    10/09/22 0939  HGBA1C 6.9*   Sugar is controlled  Keep on Metformin and current BP meds  Labs today, and urine test.  Recommend Eye Doctor visit if you can.  Future vaccines if you change your mind.   Please schedule a Follow-up Appointment to: Return in about 1 year (around 10/10/2023) for 1 year fasting lab only then 1 week later Annual Physical.  If you have any other questions or concerns, please feel free to call the office or send a message through [place]. You may also schedule an earlier appointment if necessary.  Additionally, you may be receiving a survey about your experience at our office within a few days to 1 week by e-mail or mail. We value your feedback.  Nobie Putnam, DO [place]

## 2022-10-10 LAB — PSA: PSA: 2.84 ng/mL (ref <=4.00)

## 2022-10-10 LAB — CBC WITH DIFFERENTIAL/PLATELET
Absolute Monocytes: 708 cells/uL (ref 200–950)
Basophils Absolute: 30 cells/uL (ref 0–200)
Basophils Relative: 0.5 %
Eosinophils Absolute: 124 cells/uL (ref 15–500)
Eosinophils Relative: 2.1 %
HCT: 44.1 % (ref 38.5–50.0)
Hemoglobin: 14.6 g/dL (ref 13.2–17.1)
Lymphs Abs: 1280 cells/uL (ref 850–3900)
MCH: 26.6 pg — ABNORMAL LOW (ref 27.0–33.0)
MCHC: 33.1 g/dL (ref 32.0–36.0)
MCV: 80.5 fL (ref 80.0–100.0)
MPV: 11.6 fL (ref 7.5–12.5)
Monocytes Relative: 12 %
Neutro Abs: 3758 cells/uL (ref 1500–7800)
Neutrophils Relative %: 63.7 %
Platelets: 304 10*3/uL (ref 140–400)
RBC: 5.48 10*6/uL (ref 4.20–5.80)
RDW: 13.1 % (ref 11.0–15.0)
Total Lymphocyte: 21.7 %
WBC: 5.9 10*3/uL (ref 3.8–10.8)

## 2022-10-10 LAB — COMPLETE METABOLIC PANEL WITH GFR
AG Ratio: 1.5 (calc) (ref 1.0–2.5)
ALT: 16 U/L (ref 9–46)
AST: 17 U/L (ref 10–35)
Albumin: 4.6 g/dL (ref 3.6–5.1)
Alkaline phosphatase (APISO): 57 U/L (ref 35–144)
BUN: 10 mg/dL (ref 7–25)
CO2: 28 mmol/L (ref 20–32)
Calcium: 9.6 mg/dL (ref 8.6–10.3)
Chloride: 99 mmol/L (ref 98–110)
Creat: 0.93 mg/dL (ref 0.70–1.28)
Globulin: 3.1 g/dL (calc) (ref 1.9–3.7)
Glucose, Bld: 98 mg/dL (ref 65–99)
Potassium: 3.9 mmol/L (ref 3.5–5.3)
Sodium: 137 mmol/L (ref 135–146)
Total Bilirubin: 0.4 mg/dL (ref 0.2–1.2)
Total Protein: 7.7 g/dL (ref 6.1–8.1)
eGFR: 85 mL/min/{1.73_m2} (ref >=60)

## 2022-10-10 LAB — MICROALBUMIN / CREATININE URINE RATIO
Creatinine, Urine: 41 mg/dL (ref 20–320)
Microalb Creat Ratio: 256 mcg/mg creat — ABNORMAL HIGH (ref <30)
Microalb, Ur: 10.5 mg/dL

## 2022-10-10 LAB — LIPID PANEL
Cholesterol: 153 mg/dL (ref <200)
HDL: 47 mg/dL (ref >=40)
LDL Cholesterol (Calc): 87 mg/dL (calc)
Non-HDL Cholesterol (Calc): 106 mg/dL (calc) (ref <130)
Total CHOL/HDL Ratio: 3.3 (calc) (ref <5.0)
Triglycerides: 95 mg/dL (ref <150)

## 2022-10-10 LAB — TSH: TSH: 2.96 mIU/L (ref 0.40–4.50)

## 2022-11-20 ENCOUNTER — Ambulatory Visit (INDEPENDENT_AMBULATORY_CARE_PROVIDER_SITE_OTHER): Payer: Medicare Other | Admitting: Family Medicine

## 2022-11-20 ENCOUNTER — Encounter: Payer: Self-pay | Admitting: Family Medicine

## 2022-11-20 VITALS — BP 136/74 | HR 62 | Ht 68.0 in | Wt 218.8 lb

## 2022-11-20 DIAGNOSIS — J3089 Other allergic rhinitis: Secondary | ICD-10-CM

## 2022-11-20 DIAGNOSIS — H9191 Unspecified hearing loss, right ear: Secondary | ICD-10-CM | POA: Diagnosis not present

## 2022-11-20 DIAGNOSIS — H6991 Unspecified Eustachian tube disorder, right ear: Secondary | ICD-10-CM | POA: Diagnosis not present

## 2022-11-20 DIAGNOSIS — H7391 Unspecified disorder of tympanic membrane, right ear: Secondary | ICD-10-CM

## 2022-11-20 MED ORDER — FLUTICASONE PROPIONATE 50 MCG/ACT NA SUSP: 2.0000 | Freq: Every day | NASAL | 0 refills | Status: DC

## 2022-11-20 NOTE — Progress Notes (Signed)
Subjective:    Patient ID: Brady Sanchez, male    DOB: 06-24-1946, 77 y.o.   MRN: [id number]  Brady Sanchez is a 77 y.o. male presenting on 11/20/2022 for Ear Problem   HPI  RIght Ear problem Hearing Loss He reports some interference on his Right ear. Recent problem past few days. He denies any ear pain or pressure or drainage He does not wear hearing aids. He has history of hearing evaluation before       07/28/2022   11:39 AM 09/13/2021    1:21 PM 07/26/2021    1:27 PM  Depression screen PHQ 2/9  Decreased Interest 0 0 0  Down, Depressed, Hopeless 0 0 0  PHQ - 2 Score 0 0 0  Altered sleeping 0 0   Tired, decreased energy 0 0   Change in appetite 0 0   Feeling bad or failure about yourself  0 0   Trouble concentrating 0 0   Moving slowly or fidgety/restless 0 0   Suicidal thoughts 0 0   PHQ-9 Score 0 0   Difficult doing work/chores Not difficult at all Not difficult at all     Social History   Tobacco Use   Smoking status: Never   Smokeless tobacco: Never  Vaping Use   Vaping Use: Never used  Substance Use Topics   Alcohol use: Yes   Drug use: No    Review of Systems Per HPI unless specifically indicated above     Objective:    BP 136/74   Pulse 62   Ht '5\' 8"'$  (1.727 m)   Wt 218 lb 12.8 oz (99.2 kg)   SpO2 99%   BMI 33.27 kg/m   Wt Readings from Last 3 Encounters:  11/20/22 218 lb 12.8 oz (99.2 kg)  10/09/22 217 lb 3.2 oz (98.5 kg)  07/28/22 218 lb 6.4 oz (99.1 kg)    Physical Exam Vitals and nursing note reviewed.  Constitutional:      General: He is not in acute distress.    Appearance: Normal appearance. He is well-developed. He is not diaphoretic.     Comments: Well-appearing, comfortable, cooperative  HENT:     Head: Normocephalic and atraumatic.     Right Ear: Ear canal and external ear normal. There is no impacted cerumen.     Left Ear: Tympanic membrane, ear canal and external ear normal. There is no impacted cerumen.      Ears:     Comments: R TM with some hazy appearance possible scar tissue, some effusion. No obvious erythema or bulging Eyes:     General:        Right eye: No discharge.        Left eye: No discharge.     Conjunctiva/sclera: Conjunctivae normal.  Cardiovascular:     Rate and Rhythm: Normal rate.  Pulmonary:     Effort: Pulmonary effort is normal.  Skin:    General: Skin is warm and dry.     Findings: No erythema or rash.  Neurological:     Mental Status: He is alert and oriented to person, place, and time.  Psychiatric:        Mood and Affect: Mood normal.        Behavior: Behavior normal.        Thought Content: Thought content normal.     Comments: Well groomed, good eye contact, normal speech and thoughts    Results for orders placed or performed in visit on  10/09/22  COMPLETE METABOLIC PANEL WITH GFR  Result Value Ref Range   Glucose, Bld 98 65 - 99 mg/dL   BUN 10 7 - 25 mg/dL   Creat 0.93 0.70 - 1.28 mg/dL   eGFR 85 > OR = 60 mL/min/1.68m   BUN/Creatinine Ratio SEE NOTE: 6 - 22 (calc)   Sodium 137 135 - 146 mmol/L   Potassium 3.9 3.5 - 5.3 mmol/L   Chloride 99 98 - 110 mmol/L   CO2 28 20 - 32 mmol/L   Calcium 9.6 8.6 - 10.3 mg/dL   Total Protein 7.7 6.1 - 8.1 g/dL   Albumin 4.6 3.6 - 5.1 g/dL   Globulin 3.1 1.9 - 3.7 g/dL (calc)   AG Ratio 1.5 1.0 - 2.5 (calc)   Total Bilirubin 0.4 0.2 - 1.2 mg/dL   Alkaline phosphatase (APISO) 57 35 - 144 U/L   AST 17 10 - 35 U/L   ALT 16 9 - 46 U/L  CBC with Differential/Platelet  Result Value Ref Range   WBC 5.9 3.8 - 10.8 Thousand/uL   RBC 5.48 4.20 - 5.80 Million/uL   Hemoglobin 14.6 13.2 - 17.1 g/dL   HCT 44.1 38.5 - 50.0 %   MCV 80.5 80.0 - 100.0 fL   MCH 26.6 (L) 27.0 - 33.0 pg   MCHC 33.1 32.0 - 36.0 g/dL   RDW 13.1 11.0 - 15.0 %   Platelets 304 140 - 400 Thousand/uL   MPV 11.6 7.5 - 12.5 fL   Neutro Abs 3,758 1,500 - 7,800 cells/uL   Lymphs Abs 1,280 850 - 3,900 cells/uL   Absolute Monocytes 708 200 - 950  cells/uL   Eosinophils Absolute 124 15 - 500 cells/uL   Basophils Absolute 30 0 - 200 cells/uL   Neutrophils Relative % 63.7 %   Total Lymphocyte 21.7 %   Monocytes Relative 12.0 %   Eosinophils Relative 2.1 %   Basophils Relative 0.5 %  Lipid panel  Result Value Ref Range   Cholesterol 153 <200 mg/dL   HDL 47 > OR = 40 mg/dL   Triglycerides 95 <150 mg/dL   LDL Cholesterol (Calc) 87 mg/dL (calc)   Total CHOL/HDL Ratio 3.3 <5.0 (calc)   Non-HDL Cholesterol (Calc) 106 <130 mg/dL (calc)  PSA  Result Value Ref Range   PSA 2.84 < OR = 4.00 ng/mL  TSH  Result Value Ref Range   TSH 2.96 0.40 - 4.50 mIU/L  Microalbumin / creatinine urine ratio  Result Value Ref Range   Creatinine, Urine 41 20 - 320 mg/dL   Microalb, Ur 10.5 mg/dL   Microalb Creat Ratio 256 (H) <30 mcg/mg creat  POCT HgB A1C  Result Value Ref Range   Hemoglobin A1C 6.9 (A) 4.0 - 5.6 %      Assessment & Plan:   Problem List Items Addressed This Visit   None Visit Diagnoses     Tympanic membrane disorder, right    -  Primary   Hearing loss of right ear, unspecified hearing loss type       Acute dysfunction of right eustachian tube       Relevant Medications   fluticasone (FLONASE) 50 MCG/ACT nasal spray   Seasonal allergic rhinitis due to other allergic trigger       Relevant Medications   fluticasone (FLONASE) 50 MCG/ACT nasal spray       Right Ear Drum appears to have some hazy or cloudy appearance. No sign of infection or ear wax or other cause.  We can try to treat the sinuses if any fluid or pressure causing this on the ear drum.  Start nasal steroid Flonase 2 sprays in each nostril daily for 4-6 weeks, may repeat course seasonally or as needed  If successful can keep using the nasal spray as needed  If not successful, call us back and we can refer you to ENT specialist to evaluate the ear drum further, may need other treatment or hearing evaluation.  [facility] [place], [place] [address] Phone: [phone]   No orders of the defined types were placed in this encounter.    Meds ordered this encounter  Medications   fluticasone (FLONASE) 50 MCG/ACT nasal spray    Sig: Place 2 sprays into both nostrils daily. Use for 4-6 weeks then stop and use seasonally or as needed.    Dispense:  16 g    Refill:  0      Follow up plan: Return if symptoms worsen or fail to improve.   Nobie Putnam, [facility] 11/20/2022, 2:24 PM

## 2022-11-20 NOTE — Patient Instructions (Addendum)
Thank you for coming to the office today.  Right Ear Drum appears to have some hazy or cloudy appearance. No sign of infection or ear wax or other cause.  We can try to treat the sinuses if any fluid or pressure causing this on the ear drum.  Start nasal steroid Flonase 2 sprays in each nostril daily for 4-6 weeks, may repeat course seasonally or as needed  If successful can keep using the nasal spray as needed  If not successful, call us back and we can refer you to ENT specialist to evaluate the ear drum further, may need other treatment or hearing evaluation.  [facility] [address] [place], [place] [address] Phone: [phone]    Please schedule a Follow-up Appointment to: Return if symptoms worsen or fail to improve.  If you have any other questions or concerns, please feel free to call the office or send a message through [place]. You may also schedule an earlier appointment if necessary.  Additionally, you may be receiving a survey about your experience at our office within a few days to 1 week by e-mail or mail. We value your feedback.  Nobie Putnam, DO [place]

## 2022-11-22 ENCOUNTER — Telehealth: Payer: Self-pay

## 2022-11-22 DIAGNOSIS — H9191 Unspecified hearing loss, right ear: Secondary | ICD-10-CM

## 2022-11-22 DIAGNOSIS — H9201 Otalgia, right ear: Secondary | ICD-10-CM

## 2022-11-22 DIAGNOSIS — H7391 Unspecified disorder of tympanic membrane, right ear: Secondary | ICD-10-CM

## 2022-11-22 DIAGNOSIS — H6991 Unspecified Eustachian tube disorder, right ear: Secondary | ICD-10-CM

## 2022-11-22 NOTE — Telephone Encounter (Signed)
Copied from [place] [id number]. Topic: Referral - Request for Referral >> Nov 22, 2022 10:20 AM Ludger Nutting wrote: Has patient seen PCP for this complaint? Yes.   *If NO, is insurance requiring patient see PCP for this issue before PCP can refer them? Referral for which specialty: ENT Preferred provider/office: [facility] Reason for referral: Ear Pain

## 2022-11-22 NOTE — Telephone Encounter (Signed)
Referral sent to [facility]  Nobie Putnam, [facility] 11/22/2022, 10:44 AM

## 2022-12-12 ENCOUNTER — Other Ambulatory Visit: Payer: Self-pay | Admitting: Family Medicine

## 2022-12-12 DIAGNOSIS — H6991 Unspecified Eustachian tube disorder, right ear: Secondary | ICD-10-CM

## 2022-12-12 DIAGNOSIS — J3089 Other allergic rhinitis: Secondary | ICD-10-CM

## 2022-12-12 NOTE — Telephone Encounter (Signed)
Requesting 90 DS  Requested Prescriptions  Pending Prescriptions Disp Refills   fluticasone (FLONASE) 50 MCG/ACT nasal spray [Pharmacy Med Name: FLUTICASONE PROP 50 MCG SPRAY] 48 mL 0    Sig: PLACE 2 SPRAYS INTO BOTH NOSTRILS DAILY. USE FOR 4-6 WEEKS THEN STOP AND USE SEASONALLY OR AS NEEDED.     Ear, Nose, and Throat: Nasal Preparations - Corticosteroids Passed - 12/12/2022 10:30 AM      Passed - Valid encounter within last 12 months    Recent Outpatient Visits           3 weeks ago Tympanic membrane disorder, right   [facility] [place], Devonne Doughty, DO   2 months ago Type 2 diabetes mellitus with other specified complication, without long-term current use of insulin [facility])   [facility] [place], Devonne Doughty, DO   1 year ago Type 2 diabetes mellitus with other specified complication, without long-term current use of insulin [facility])   [facility] [place], Devonne Doughty, DO   1 year ago Type 2 diabetes mellitus with other specified complication, without long-term current use of insulin [facility])   [facility] [place], Devonne Doughty, DO   2 years ago Type 2 diabetes mellitus with other specified complication, without long-term current use of insulin [facility])   [place], DO       Future Appointments             In 10 months [place], Devonne Doughty, DO [facility], [facility]

## 2022-12-15 ENCOUNTER — Other Ambulatory Visit: Payer: Self-pay | Admitting: Family Medicine

## 2022-12-15 DIAGNOSIS — L309 Dermatitis, unspecified: Secondary | ICD-10-CM

## 2022-12-15 NOTE — Telephone Encounter (Signed)
Requested medications are due for refill today.  unsure  Requested medications are on the active medications list.  yes  Last refill. 07/20/2022 15 g 3 rf  Future visit scheduled.   yes  Notes to clinic.  Refill not delegated.    Requested Prescriptions  Pending Prescriptions Disp Refills   triamcinolone cream (KENALOG) 0.5 % [Pharmacy Med Name: TRIAMCINOLONE 0.5% CREAM] 15 g 3    Sig: APPLY TO AFFECTED AREA EVERY DAY AS NEEDED     Not Delegated - Dermatology:  Corticosteroids Failed - 12/15/2022  4:23 PM      Failed - This refill cannot be delegated      Passed - Valid encounter within last 12 months    Recent Outpatient Visits           3 weeks ago Tympanic membrane disorder, right   [facility] [place], Devonne Doughty, DO   2 months ago Type 2 diabetes mellitus with other specified complication, without long-term current use of insulin [facility])   [facility] [place], Devonne Doughty, DO   1 year ago Type 2 diabetes mellitus with other specified complication, without long-term current use of insulin [facility])   [facility] [place], Devonne Doughty, DO   1 year ago Type 2 diabetes mellitus with other specified complication, without long-term current use of insulin [facility])   [facility] [place], Devonne Doughty, DO   2 years ago Type 2 diabetes mellitus with other specified complication, without long-term current use of insulin [facility])   [place], DO       Future Appointments             In 10 months [place], Devonne Doughty, DO [facility], [facility]

## 2023-04-23 ENCOUNTER — Other Ambulatory Visit: Payer: Self-pay | Admitting: Family Medicine

## 2023-04-23 DIAGNOSIS — L309 Dermatitis, unspecified: Secondary | ICD-10-CM

## 2023-04-24 NOTE — Telephone Encounter (Signed)
Requested medications are due for refill today.  unsure  Requested medications are on the active medications list.  yes  Last refill. 12/19/2022 15 g 3 rf  Future visit scheduled.   yes  Notes to clinic.  Refill not delegated.    Requested Prescriptions  Pending Prescriptions Disp Refills   triamcinolone cream (KENALOG) 0.5 % [Pharmacy Med Name: TRIAMCINOLONE 0.5% CREAM] 15 g 3    Sig: APPLY TO AFFECTED AREA EVERY DAY AS NEEDED     Not Delegated - Dermatology:  Corticosteroids Failed - 04/23/2023 12:24 PM      Failed - This refill cannot be delegated      Passed - Valid encounter within last 12 months    Recent Outpatient Visits           5 months ago Tympanic membrane disorder, right   [place] [facility] [place], Netta Neat, DO   6 months ago Type 2 diabetes mellitus with other specified complication, without long-term current use of insulin [facility])   [place] [facility] [place], Netta Neat, DO   1 year ago Type 2 diabetes mellitus with other specified complication, without long-term current use of insulin [facility])   [place] [facility] [place], Netta Neat, DO   2 years ago Type 2 diabetes mellitus with other specified complication, without long-term current use of insulin [facility])   [place] [facility] [place], Netta Neat, DO   2 years ago Type 2 diabetes mellitus with other specified complication, without long-term current use of insulin [facility])   [place] [facility] [place], Netta Neat, DO       Future Appointments             In 5 months Althea Charon, Netta Neat, DO [facility], [facility]

## 2023-07-21 ENCOUNTER — Other Ambulatory Visit: Payer: Self-pay | Admitting: Family Medicine

## 2023-07-21 DIAGNOSIS — I10 Essential (primary) hypertension: Secondary | ICD-10-CM

## 2023-07-23 NOTE — Telephone Encounter (Signed)
Requested Prescriptions  Refused Prescriptions Disp Refills   lisinopril (ZESTRIL) 40 MG tablet [Pharmacy Med Name: LISINOPRIL 40 MG TABLET] 90 tablet 3    Sig: TAKE 1 TABLET BY MOUTH EVERY DAY     Cardiovascular:  ACE Inhibitors Failed - 07/21/2023  9:10 AM      Failed - Cr in normal range and within 180 days    Creat  Date Value Ref Range Status  10/09/2022 0.93 0.70 - 1.28 mg/dL Final   Creatinine, Urine  Date Value Ref Range Status  10/09/2022 41 20 - 320 mg/dL Final         Failed - K in normal range and within 180 days    Potassium  Date Value Ref Range Status  10/09/2022 3.9 3.5 - 5.3 mmol/L Final  01/30/2012 3.6 3.5 - 5.1 mmol/L Final         Failed - Valid encounter within last 6 months    Recent Outpatient Visits           8 months ago Tympanic membrane disorder, right   [place] [facility] Smitty Cords, DO   9 months ago Type 2 diabetes mellitus with other specified complication, without long-term current use of insulin [facility])   [place] [facility] [place], Netta Neat, DO   1 year ago Type 2 diabetes mellitus with other specified complication, without long-term current use of insulin [facility])   [place] [facility] [place], Netta Neat, DO   2 years ago Type 2 diabetes mellitus with other specified complication, without long-term current use of insulin [facility])   [place] [facility] [place], Netta Neat, DO   2 years ago Type 2 diabetes mellitus with other specified complication, without long-term current use of insulin [facility])   [place] [facility] [place], Netta Neat, DO       Future Appointments             In 3 months Althea Charon, Netta Neat, DO [place] [facility], [facility]            Passed - Patient is not pregnant      Passed - Last BP in normal range    BP Readings from Last 1 Encounters:  11/20/22 136/74

## 2023-07-24 ENCOUNTER — Other Ambulatory Visit: Payer: Self-pay | Admitting: Family Medicine

## 2023-07-24 DIAGNOSIS — I10 Essential (primary) hypertension: Secondary | ICD-10-CM

## 2023-07-24 NOTE — Telephone Encounter (Signed)
Medication Refill - Medication: lisinopril (ZESTRIL) 40 MG tablet [Pharmacy Med Name: LISINOPRIL 40 MG TABLET] [[id number]]  Has the patient contacted their pharmacy? Yes.    Preferred Pharmacy (with phone number or street name):  [phone] [place], [place] - [address]  [address] [place], [place] [address]  Phone:  [phone]  Fax:  [phone]  DEA #:  [id number]      Has the patient been seen for an appointment in the last year OR does the patient have an upcoming appointment? Yes.    Agent: Please be advised that RX refills may take up to 3 business days. We ask that you follow-up with your pharmacy.

## 2023-07-25 NOTE — Telephone Encounter (Signed)
Request is too soon, last refill 09/25/22 for 90 and 3 refills.  Requested Prescriptions  Pending Prescriptions Disp Refills   lisinopril (ZESTRIL) 40 MG tablet 90 tablet 3    Sig: Take 1 tablet (40 mg total) by mouth daily.     Cardiovascular:  ACE Inhibitors Failed - 07/24/2023  9:01 AM      Failed - Cr in normal range and within 180 days    Creat  Date Value Ref Range Status  10/09/2022 0.93 0.70 - 1.28 mg/dL Final   Creatinine, Urine  Date Value Ref Range Status  10/09/2022 41 20 - 320 mg/dL Final         Failed - K in normal range and within 180 days    Potassium  Date Value Ref Range Status  10/09/2022 3.9 3.5 - 5.3 mmol/L Final  01/30/2012 3.6 3.5 - 5.1 mmol/L Final         Failed - Valid encounter within last 6 months    Recent Outpatient Visits           8 months ago Tympanic membrane disorder, right   [place] [facility] Smitty Cords, DO   9 months ago Type 2 diabetes mellitus with other specified complication, without long-term current use of insulin [facility])   [facility] [place], Netta Neat, DO   1 year ago Type 2 diabetes mellitus with other specified complication, without long-term current use of insulin [facility])   [place] [facility] [place], Netta Neat, DO   2 years ago Type 2 diabetes mellitus with other specified complication, without long-term current use of insulin [facility])   [place] [facility] [place], Netta Neat, DO   2 years ago Type 2 diabetes mellitus with other specified complication, without long-term current use of insulin [facility])   [place] [facility] [place], Netta Neat, DO       Future Appointments             In 3 months Althea Charon, Netta Neat, DO [place] [facility], [facility]            Passed - Patient is not pregnant      Passed - Last BP in normal range    BP Readings from Last 1  Encounters:  11/20/22 136/74

## 2023-07-27 ENCOUNTER — Other Ambulatory Visit: Payer: Self-pay | Admitting: Family Medicine

## 2023-07-27 DIAGNOSIS — I10 Essential (primary) hypertension: Secondary | ICD-10-CM

## 2023-07-27 NOTE — Telephone Encounter (Signed)
Medication Refill - Medication:  lisinopril (ZESTRIL) 40 MG tablet  *patient has enough medication for 2 days  Has the patient contacted their pharmacy? Yes, advised to contact PCP  Preferred Pharmacy (with phone number or street name):  [id number] [place] [place] [address]  Phone: [phone] Fax: [phone]   Has the patient been seen for an appointment in the last year OR does the patient have an upcoming appointment? Yes, Med AWV scheduled for 9.20.24

## 2023-07-28 ENCOUNTER — Other Ambulatory Visit: Payer: Self-pay | Admitting: Family Medicine

## 2023-07-28 DIAGNOSIS — I10 Essential (primary) hypertension: Secondary | ICD-10-CM

## 2023-07-30 MED ORDER — LISINOPRIL 40 MG PO TABS
40.0000 mg | ORAL_TABLET | Freq: Every day | ORAL | 3 refills | Status: DC
Start: 2023-07-30 — End: 2024-04-18

## 2023-07-30 NOTE — Telephone Encounter (Signed)
Requested medication (s) are due for refill today:   Yes  Requested medication (s) are on the active medication list:   Yes  Future visit scheduled:   Yes 10/25/2023    Has an AWV prior to this appt.   Last ordered: 11/13/223 #90, 3 refills  Returned because labs are due so unable to refill per protocol.   Requested Prescriptions  Pending Prescriptions Disp Refills   lisinopril (ZESTRIL) 40 MG tablet 90 tablet 3    Sig: Take 1 tablet (40 mg total) by mouth daily.     Cardiovascular:  ACE Inhibitors Failed - 07/27/2023 11:33 AM      Failed - Cr in normal range and within 180 days    Creat  Date Value Ref Range Status  10/09/2022 0.93 0.70 - 1.28 mg/dL Final   Creatinine, Urine  Date Value Ref Range Status  10/09/2022 41 20 - 320 mg/dL Final         Failed - K in normal range and within 180 days    Potassium  Date Value Ref Range Status  10/09/2022 3.9 3.5 - 5.3 mmol/L Final  01/30/2012 3.6 3.5 - 5.1 mmol/L Final         Failed - Valid encounter within last 6 months    Recent Outpatient Visits           8 months ago Tympanic membrane disorder, right   [place] [facility] Smitty Cords, DO   9 months ago Type 2 diabetes mellitus with other specified complication, without long-term current use of insulin [facility])   [place] [facility] [place], Netta Neat, DO   1 year ago Type 2 diabetes mellitus with other specified complication, without long-term current use of insulin [facility])   [place] [facility] [place], Netta Neat, DO   2 years ago Type 2 diabetes mellitus with other specified complication, without long-term current use of insulin [facility])   [place] [place] [facility] [place], Netta Neat, DO   2 years ago Type 2 diabetes mellitus with other specified complication, without long-term current use of insulin [facility])   [place] [facility] [place], Netta Neat, DO        Future Appointments             In 2 months Althea Charon, Netta Neat, DO [place] [facility], [facility]            Passed - Patient is not pregnant      Passed - Last BP in normal range    BP Readings from Last 1 Encounters:  11/20/22 136/74

## 2023-08-03 ENCOUNTER — Ambulatory Visit (INDEPENDENT_AMBULATORY_CARE_PROVIDER_SITE_OTHER): Payer: Medicare Other

## 2023-08-03 VITALS — BP 130/70 | Ht 68.0 in | Wt 214.0 lb

## 2023-08-03 DIAGNOSIS — Z0000 Encounter for general adult medical examination without abnormal findings: Secondary | ICD-10-CM | POA: Diagnosis not present

## 2023-08-03 NOTE — Progress Notes (Signed)
Subjective:   Brady Sanchez is a 77 y.o. male who presents for Medicare Annual/Subsequent preventive examination.  Visit Complete: In person  Cardiac Risk Factors include: advanced age (>68men, >80 women);diabetes mellitus;dyslipidemia;hypertension;male gender;obesity (BMI >30kg/m2)     Objective:    Today's Vitals   08/03/23 1438 08/03/23 1442  BP: 130/70   Weight: 214 lb (97.1 kg)   Height: 5\' 8"  (1.727 m)   PainSc:  4    Body mass index is 32.54 kg/m.     08/03/2023    2:47 PM 07/28/2022   11:41 AM 07/26/2021    1:26 PM 08/06/2019    1:27 AM 11/13/2018    5:41 PM 11/13/2018   10:56 AM 07/05/2018    6:42 AM  Advanced Directives  Does Patient Have a Medical Advance Directive? No No No No No No No  Would patient like information on creating a medical advance directive? No - Patient declined No - Patient declined  No - Patient declined No - Patient declined  No - Patient declined    Current Medications (verified) Outpatient Encounter Medications as of 08/03/2023  Medication Sig   Albuterol Sulfate (PROAIR RESPICLICK) 108 (90 Base) MCG/ACT AEPB Inhale 2 puffs into the lungs every 4 (four) hours as needed.   amLODipine (NORVASC) 10 MG tablet Take 1 tablet (10 mg total) by mouth daily.   atorvastatin (LIPITOR) 20 MG tablet Take 1 tablet (20 mg total) by mouth daily.   hydrochlorothiazide (HYDRODIURIL) 25 MG tablet TAKE 1 TABLET (25 MG TOTAL) BY MOUTH DAILY.   lisinopril (ZESTRIL) 40 MG tablet Take 1 tablet (40 mg total) by mouth daily.   metFORMIN (GLUCOPHAGE) 1000 MG tablet Take 1 tablet (1,000 mg total) by mouth 2 (two) times daily with a meal.   metoprolol succinate (TOPROL-XL) 50 MG 24 hr tablet Take 1 tablet (50 mg total) by mouth daily. for high blood pressure   triamcinolone cream (KENALOG) 0.5 % APPLY TO AFFECTED AREA EVERY DAY AS NEEDED   fluticasone (FLONASE) 50 MCG/ACT nasal spray PLACE 2 SPRAYS INTO BOTH NOSTRILS DAILY. USE FOR 4-6 WEEKS THEN STOP AND USE  SEASONALLY OR AS NEEDED. (Patient not taking: Reported on 08/03/2023)   No facility-administered encounter medications on file as of 08/03/2023.    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  Tobacco Use   Smoking status: Never   Smokeless tobacco: Never  Vaping Use   Vaping status: Never Used  Substance and Sexual Activity   Alcohol use: Yes   Drug use: No   Sexual activity: Not on file  Other Topics Concern   Not on file  Social History Narrative   Not on file   Social Determinants of Health   Financial Resource Strain: Low Risk  (08/03/2023)   Overall Financial Resource Strain (CARDIA)  Difficulty of Paying Living Expenses: Not hard at all  Food Insecurity: No Food Insecurity (08/03/2023)   Hunger Vital Sign    Worried About Running Out of Food in the Last Year: Never true    Ran Out of Food in the Last Year: Never true  Transportation Needs: No Transportation Needs (08/03/2023)   PRAPARE - Administrator, Civil Service (Medical): No    Lack of Transportation (Non-Medical): No  Physical Activity: Sufficiently Active (08/03/2023)   Exercise Vital Sign    Days of Exercise per Week: 5 days    Minutes of Exercise per Session: 60 min  Stress: No Stress Concern Present (08/03/2023)   [employer] of Occupational Health - Occupational Stress Questionnaire    Feeling of Stress : Only a  little  Social Connections: Socially Isolated (08/03/2023)   Social Connection and Isolation Panel [NHANES]    Frequency of Communication with Friends and Family: More than three times a week    Frequency of Social Gatherings with Friends and Family: Twice a week    Attends Religious Services: Never    Database administrator or Organizations: No    Attends Banker Meetings: Never    Marital Status: Widowed    Tobacco Counseling Counseling given: Not Answered   Clinical Intake:  Pre-visit preparation completed: Yes  Pain : 0-10 Pain Score: 4  Pain Type: Chronic pain Pain Location: Heel Pain Orientation: Left Pain Descriptors / Indicators: Aching, Burning, Constant Pain Onset: 1 to 4 weeks ago Pain Frequency: Constant Pain Relieving Factors: cortisone shot  Pain Relieving Factors: cortisone shot  Nutritional Status: BMI > 30  Obese Nutritional Risks: None Diabetes: Yes CBG done?: No Did pt. bring in CBG monitor from home?: No  How often do you need to have someone help you when you read instructions, pamphlets, or other written materials from your doctor or pharmacy?: 1 - Never  Interpreter Needed?: No  Information entered by :: Kennedy Bucker, LPN   Activities of Daily Living    08/03/2023    2:48 PM  In your present state of health, do you have any difficulty performing the following activities:  Hearing? 0  Vision? 0  Difficulty concentrating or making decisions? 0  Walking or climbing stairs? 1  Dressing or bathing? 0  Doing errands, shopping? 0  Preparing Food and eating ? N  Using the Toilet? N  In the past six months, have you accidently leaked urine? N  Do you have problems with loss of bowel control? N  Managing your Medications? N  Managing your Finances? N  Housekeeping or managing your Housekeeping? N    Patient Care Team: Smitty Cords, DO as PCP - General (Family Medicine)  Indicate any recent Medical Services you may  have received from other than [facility] providers in the past year (date may be approximate).     Assessment:   This is a routine wellness examination for [place].  Hearing/Vision screen Hearing Screening - Comments:: No aids Vision Screening - Comments:: Readers- Dr.Nice   Goals Addressed             This Visit's Progress    DIET - INCREASE WATER INTAKE         Depression Screen    08/03/2023    2:46 PM 07/28/2022   11:39 AM 09/13/2021    1:21 PM 07/26/2021    1:27 PM 09/21/2020    8:08 AM 06/18/2020    8:23 AM 12/19/2019  8:08 AM  PHQ 2/9 Scores  PHQ - 2 Score 0 0 0 0 0 0 0  PHQ- 9 Score 0 0 0        Fall Risk    08/03/2023    2:48 PM 07/28/2022   11:42 AM 09/13/2021    1:21 PM 07/26/2021    1:27 PM 09/21/2020    8:08 AM  Fall Risk   Falls in the past year? 0 0 0 0 0  Number falls in past yr: 0 0 0  0  Injury with Fall? 0 0 0  0  Risk for fall due to : No Fall Risks No Fall Risks  Medication side effect   Follow up Falls prevention discussed;Falls evaluation completed Falls prevention discussed Falls evaluation completed Falls evaluation completed;Education provided;Falls prevention discussed Falls evaluation completed    MEDICARE RISK AT HOME: Medicare Risk at Home Any stairs in or around the home?: No If so, are there any without handrails?: No Home free of loose throw rugs in walkways, pet beds, electrical cords, etc?: Yes Adequate lighting in your home to reduce risk of falls?: Yes Life alert?: No Use of a cane, walker or w/c?: No Grab bars in the bathroom?: No Shower chair or bench in shower?: No Elevated toilet seat or a handicapped toilet?: No  TIMED UP AND GO:  Was the test performed?  Yes  Length of time to ambulate 10 feet: 4 sec Gait steady and fast without use of assistive device    Cognitive Function:        08/03/2023    2:50 PM 07/26/2021    1:28 PM  6CIT Screen  What Year? 0 points 0 points  What month? 0 points 0 points  What time? 0  points 0 points  Count back from 20 0 points 0 points  Months in reverse 0 points 4 points  Repeat phrase 4 points 6 points  Total Score 4 points 10 points    Immunizations Immunization History  Administered Date(s) Administered   Fluad Quad(high Dose 65+) 08/19/2019   Moderna Sars-Covid-2 Vaccination 12/22/2019, 01/19/2020    TDAP status: Due, Education has been provided regarding the importance of this vaccine. Advised may receive this vaccine at local pharmacy or Health Dept. Aware to provide a copy of the vaccination record if obtained from local pharmacy or Health Dept. Verbalized acceptance and understanding.  Flu Vaccine status: Declined, Education has been provided regarding the importance of this vaccine but patient still declined. Advised may receive this vaccine at local pharmacy or Health Dept. Aware to provide a copy of the vaccination record if obtained from local pharmacy or Health Dept. Verbalized acceptance and understanding.  Pneumococcal vaccine status: Declined,  Education has been provided regarding the importance of this vaccine but patient still declined. Advised may receive this vaccine at local pharmacy or Health Dept. Aware to provide a copy of the vaccination record if obtained from local pharmacy or Health Dept. Verbalized acceptance and understanding.   Covid-19 vaccine status: Completed vaccines  Qualifies for Shingles Vaccine? Yes   Zostavax completed No   Shingrix Completed?: No.    Education has been provided regarding the importance of this vaccine. Patient has been advised to call insurance company to determine out of pocket expense if they have not yet received this vaccine. Advised may also receive vaccine at local pharmacy or Health Dept. Verbalized acceptance and understanding.  Screening Tests Health Maintenance  Topic Date Due   DTaP/Tdap/Td (1 - Tdap)  Never done   Zoster Vaccines- Shingrix (1 of 2) Never done   OPHTHALMOLOGY EXAM  06/22/2020    HEMOGLOBIN A1C  04/09/2023   INFLUENZA VACCINE  06/14/2023   COVID-19 Vaccine (3 - 2023-24 season) 07/15/2023   Pneumonia Vaccine 69+ Years old (1 of 1 - PCV) 10/10/2023 (Originally 08/11/2010)   Diabetic kidney evaluation - eGFR measurement  10/10/2023   Diabetic kidney evaluation - Urine ACR  10/10/2023   FOOT EXAM  10/10/2023   Medicare Annual Wellness (AWV)  08/02/2024   Hepatitis C Screening  Completed   HPV VACCINES  Aged Out   Fecal DNA (Cologuard)  Discontinued    Health Maintenance  Health Maintenance Due  Topic Date Due   DTaP/Tdap/Td (1 - Tdap) Never done   Zoster Vaccines- Shingrix (1 of 2) Never done   OPHTHALMOLOGY EXAM  06/22/2020   HEMOGLOBIN A1C  04/09/2023   INFLUENZA VACCINE  06/14/2023   COVID-19 Vaccine (3 - 2023-24 season) 07/15/2023    Colorectal cancer screening: No longer required.   Lung Cancer Screening: (Low Dose CT Chest recommended if Age 6-80 years, 20 pack-year currently smoking OR have quit w/in 15years.) does not qualify.    Additional Screening:  Hepatitis C Screening: does qualify; Completed 04/03/19  Vision Screening: Recommended annual ophthalmology exams for early detection of glaucoma and other disorders of the eye. Is the patient up to date with their annual eye exam?  Yes  Who is the provider or what is the name of the office in which the patient attends annual eye exams? Dr.Nice If pt is not established with a provider, would they like to be referred to a provider to establish care? No .   Dental Screening: Recommended annual dental exams for proper oral hygiene  Diabetic Foot Exam: Diabetic Foot Exam: Completed 10/09/22  Community Resource Referral / Chronic Care Management: CRR required this visit?  No   CCM required this visit?  No     Plan:     I have personally reviewed and noted the following in the patient's chart:   Medical and social history Use of alcohol, tobacco or illicit drugs  Current medications and  supplements including opioid prescriptions. Patient is not currently taking opioid prescriptions. Functional ability and status Nutritional status Physical activity Advanced directives List of other physicians Hospitalizations, surgeries, and ER visits in previous 12 months Vitals Screenings to include cognitive, depression, and falls Referrals and appointments  In addition, I have reviewed and discussed with patient certain preventive protocols, quality metrics, and best practice recommendations. A written personalized care plan for preventive services as well as general preventive health recommendations were provided to patient.     Hal Hope, LPN   [id number]   After Visit Summary: none, declined  Nurse Notes: none

## 2023-08-03 NOTE — Patient Instructions (Addendum)
Brady Sanchez , Thank you for taking time to come for your Medicare Wellness Visit. I appreciate your ongoing commitment to your health goals. Please review the following plan we discussed and let me know if I can assist you in the future.   Referrals/Orders/Follow-Ups/Clinician Recommendations: none  This is a list of the screening recommended for you and due dates:  Health Maintenance  Topic Date Due   DTaP/Tdap/Td vaccine (1 - Tdap) Never done   Zoster (Shingles) Vaccine (1 of 2) Never done   Eye exam for diabetics  06/22/2020   Hemoglobin A1C  04/09/2023   Flu Shot  06/14/2023   COVID-19 Vaccine (3 - 2023-24 season) 07/15/2023   Pneumonia Vaccine (1 of 1 - PCV) 10/10/2023*   Yearly kidney function blood test for diabetes  10/10/2023   Yearly kidney health urinalysis for diabetes  10/10/2023   Complete foot exam   10/10/2023   Medicare Annual Wellness Visit  08/02/2024   Hepatitis C Screening  Completed   HPV Vaccine  Aged Out   Cologuard (Stool DNA test)  Discontinued  *Topic was postponed. The date shown is not the original due date.    Advanced directives: (ACP Link)Information on Advanced Care Planning can be found at [facility] Directives Advance Health Care Directives ([email or web address]   Next Medicare Annual Wellness Visit scheduled for next year: Yes   08/08/24 @ 1:00 pm in person

## 2023-08-21 ENCOUNTER — Other Ambulatory Visit: Payer: Self-pay | Admitting: Family Medicine

## 2023-08-21 DIAGNOSIS — I10 Essential (primary) hypertension: Secondary | ICD-10-CM

## 2023-08-21 NOTE — Telephone Encounter (Signed)
Requested Prescriptions  Pending Prescriptions Disp Refills   metoprolol succinate (TOPROL-XL) 50 MG 24 hr tablet [Pharmacy Med Name: METOPROLOL SUCC ER 50 MG TAB] 90 tablet 1    Sig: TAKE 1 TABLET (50 MG TOTAL) BY MOUTH DAILY. FOR HIGH BLOOD PRESSURE     Cardiovascular:  Beta Blockers Passed - 08/21/2023  2:21 AM      Passed - Last BP in normal range    BP Readings from Last 1 Encounters:  08/03/23 130/70         Passed - Last Heart Rate in normal range    Pulse Readings from Last 1 Encounters:  11/20/22 62         Passed - Valid encounter within last 6 months    Recent Outpatient Visits           9 months ago Tympanic membrane disorder, right   [place] [facility] [place], Netta Neat, DO   10 months ago Type 2 diabetes mellitus with other specified complication, without long-term current use of insulin [facility])   [place] [facility] [place], Netta Neat, DO   1 year ago Type 2 diabetes mellitus with other specified complication, without long-term current use of insulin [facility])   [place] [facility] [place], Netta Neat, DO   2 years ago Type 2 diabetes mellitus with other specified complication, without long-term current use of insulin (HCC)   [place] [facility] [place], Netta Neat, DO   2 years ago Type 2 diabetes mellitus with other specified complication, without long-term current use of insulin (HCC)   [place] [facility] [place], Netta Neat, DO       Future Appointments             In 2 months Althea Charon, Netta Neat, DO [place] [facility]             amLODipine (NORVASC) 10 MG tablet [Pharmacy Med Name: AMLODIPINE BESYLATE 10 MG TAB] 90 tablet 1    Sig: TAKE 1 TABLET BY MOUTH EVERY DAY     Cardiovascular: Calcium Channel Blockers 2 Passed - 08/21/2023  2:21 AM      Passed - Last BP in normal range    BP Readings from  Last 1 Encounters:  08/03/23 130/70         Passed - Last Heart Rate in normal range    Pulse Readings from Last 1 Encounters:  11/20/22 62         Passed - Valid encounter within last 6 months    Recent Outpatient Visits           9 months ago Tympanic membrane disorder, right   [place] [facility], Netta Neat, DO   10 months ago Type 2 diabetes mellitus with other specified complication, without long-term current use of insulin [facility])   [place] [facility] [place], Netta Neat, DO   1 year ago Type 2 diabetes mellitus with other specified complication, without long-term current use of insulin [facility])   [place] [facility] [place], Netta Neat, DO   2 years ago Type 2 diabetes mellitus with other specified complication, without long-term current use of insulin [facility])   [place] [facility] [place], Netta Neat, DO   2 years ago Type 2 diabetes mellitus with other specified complication, without long-term current use of insulin (HCC)  [facility] Althea Charon, Netta Neat, DO       Future Appointments             In 2 months Althea Charon, Netta Neat, DO [place] [facility], [place]

## 2023-08-27 ENCOUNTER — Other Ambulatory Visit: Payer: Self-pay | Admitting: Family Medicine

## 2023-08-27 DIAGNOSIS — E1169 Type 2 diabetes mellitus with other specified complication: Secondary | ICD-10-CM

## 2023-09-21 ENCOUNTER — Other Ambulatory Visit: Payer: Self-pay | Admitting: Family Medicine

## 2023-09-21 DIAGNOSIS — I10 Essential (primary) hypertension: Secondary | ICD-10-CM

## 2023-09-21 DIAGNOSIS — E1169 Type 2 diabetes mellitus with other specified complication: Secondary | ICD-10-CM

## 2023-09-21 NOTE — Telephone Encounter (Signed)
Requested Prescriptions  Pending Prescriptions Disp Refills   hydrochlorothiazide (HYDRODIURIL) 25 MG tablet [Pharmacy Med Name: HYDROCHLOROTHIAZIDE 25 MG TAB] 90 tablet 0    Sig: TAKE 1 TABLET (25 MG TOTAL) BY MOUTH DAILY.     Cardiovascular: Diuretics - Thiazide Failed - 09/21/2023  2:34 AM      Failed - Cr in normal range and within 180 days    Creat  Date Value Ref Range Status  10/09/2022 0.93 0.70 - 1.28 mg/dL Final   Creatinine, Urine  Date Value Ref Range Status  10/09/2022 41 20 - 320 mg/dL Final         Failed - K in normal range and within 180 days    Potassium  Date Value Ref Range Status  10/09/2022 3.9 3.5 - 5.3 mmol/L Final  01/30/2012 3.6 3.5 - 5.1 mmol/L Final         Failed - Na in normal range and within 180 days    Sodium  Date Value Ref Range Status  10/09/2022 137 135 - 146 mmol/L Final  01/30/2012 141 136 - 145 mmol/L Final         Passed - Last BP in normal range    BP Readings from Last 1 Encounters:  08/03/23 130/70         Passed - Valid encounter within last 6 months    Recent Outpatient Visits           10 months ago Tympanic membrane disorder, right   [place] [facility] Smitty Cords, DO   11 months ago Type 2 diabetes mellitus with other specified complication, without long-term current use of insulin [facility])   [place] [facility] [place], Netta Neat, DO   2 years ago Type 2 diabetes mellitus with other specified complication, without long-term current use of insulin [facility])   [place] [facility] [place], Netta Neat, DO   2 years ago Type 2 diabetes mellitus with other specified complication, without long-term current use of insulin [facility])   [place] [facility] [place], Netta Neat, DO   3 years ago Type 2 diabetes mellitus with other specified complication, without long-term current use of insulin (HCC)   [place] [facility] [place], Netta Neat, DO       Future Appointments             In 1 month Althea Charon, Netta Neat, DO [facility]             atorvastatin (LIPITOR) 20 MG tablet [Pharmacy Med Name: ATORVASTATIN 20 MG TABLET] 90 tablet 0    Sig: TAKE 1 TABLET BY MOUTH EVERY DAY     Cardiovascular:  Antilipid - Statins Failed - 09/21/2023  2:34 AM      Failed - Lipid Panel in normal range within the last 12 months    Cholesterol  Date Value Ref Range Status  10/09/2022 153 <200 mg/dL Final   LDL Cholesterol (Calc)  Date Value Ref Range Status  10/09/2022 87 mg/dL (calc) Final    Comment:    Reference range: <100 . Desirable range <100 mg/dL for primary prevention;   <70 mg/dL for patients with CHD or diabetic patients  with > or = 2 CHD risk factors. Marland Kitchen LDL-C is now calculated using the Martin-Hopkins  calculation, which is a validated novel method providing  better accuracy than the Friedewald equation in the  estimation  of LDL-C.  Horald Pollen et al. Lenox Ahr. 0102;725(36): 2061-2068  (http://education.QuestDiagnostics.com/faq/FAQ164)    HDL  Date Value Ref Range Status  10/09/2022 47 > OR = 40 mg/dL Final   Triglycerides  Date Value Ref Range Status  10/09/2022 95 <150 mg/dL Final         Passed - Patient is not pregnant      Passed - Valid encounter within last 12 months    Recent Outpatient Visits           10 months ago Tympanic membrane disorder, right   [place] [facility] [place], Netta Neat, DO   11 months ago Type 2 diabetes mellitus with other specified complication, without long-term current use of insulin [facility])   [place] [facility] [place], Netta Neat, DO   2 years ago Type 2 diabetes mellitus with other specified complication, without long-term current use of insulin [facility])   [place] [facility] [place], Netta Neat, DO   2 years ago Type 2  diabetes mellitus with other specified complication, without long-term current use of insulin [facility])   [place] [facility] [place], Netta Neat, DO   3 years ago Type 2 diabetes mellitus with other specified complication, without long-term current use of insulin (HCC)   [place] [facility] [place], Netta Neat, DO       Future Appointments             In 1 month Althea Charon, Netta Neat, DO [place] [facility], [facility]

## 2023-09-23 ENCOUNTER — Other Ambulatory Visit: Payer: Self-pay | Admitting: Family Medicine

## 2023-09-23 DIAGNOSIS — L309 Dermatitis, unspecified: Secondary | ICD-10-CM

## 2023-09-25 NOTE — Telephone Encounter (Signed)
Requested medication (s) are due for refill today:   Provider to review  Requested medication (s) are on the active medication list:   Yes  Future visit scheduled:   Yes 12/12   Last ordered: 04/24/2023 15 g, 3 refills  Non delegated refill    Requested Prescriptions  Pending Prescriptions Disp Refills   triamcinolone cream (KENALOG) 0.5 % [Pharmacy Med Name: TRIAMCINOLONE 0.5% CREAM] 15 g 3    Sig: APPLY TO AFFECTED AREA EVERY DAY AS NEEDED     Not Delegated - Dermatology:  Corticosteroids Failed - 09/23/2023  1:29 PM      Failed - This refill cannot be delegated      Passed - Valid encounter within last 12 months    Recent Outpatient Visits           10 months ago Tympanic membrane disorder, right   [place] [facility] Smitty Cords, DO   11 months ago Type 2 diabetes mellitus with other specified complication, without long-term current use of insulin [facility])   [place] [facility] [place], Netta Neat, DO   2 years ago Type 2 diabetes mellitus with other specified complication, without long-term current use of insulin [facility])   [facility] [place], Netta Neat, DO   2 years ago Type 2 diabetes mellitus with other specified complication, without long-term current use of insulin [facility])   [place] [facility] [place], Netta Neat, DO   3 years ago Type 2 diabetes mellitus with other specified complication, without long-term current use of insulin [facility])   [place] [facility] [place], Netta Neat, DO       Future Appointments             In 1 month Althea Charon, Netta Neat, DO [place] [facility], [facility]

## 2023-10-10 ENCOUNTER — Other Ambulatory Visit: Payer: Medicare Other

## 2023-10-17 ENCOUNTER — Encounter: Payer: Medicare Other | Admitting: Family Medicine

## 2023-10-18 ENCOUNTER — Other Ambulatory Visit: Payer: Medicare Other

## 2023-10-18 ENCOUNTER — Other Ambulatory Visit: Payer: Self-pay | Admitting: Internal Medicine

## 2023-10-18 DIAGNOSIS — Z1329 Encounter for screening for other suspected endocrine disorder: Secondary | ICD-10-CM

## 2023-10-18 DIAGNOSIS — N400 Enlarged prostate without lower urinary tract symptoms: Secondary | ICD-10-CM

## 2023-10-18 DIAGNOSIS — E1169 Type 2 diabetes mellitus with other specified complication: Secondary | ICD-10-CM

## 2023-10-18 DIAGNOSIS — Z125 Encounter for screening for malignant neoplasm of prostate: Secondary | ICD-10-CM

## 2023-10-22 ENCOUNTER — Other Ambulatory Visit: Payer: Medicare Other

## 2023-10-22 DIAGNOSIS — Z1329 Encounter for screening for other suspected endocrine disorder: Secondary | ICD-10-CM

## 2023-10-22 DIAGNOSIS — E1169 Type 2 diabetes mellitus with other specified complication: Secondary | ICD-10-CM

## 2023-10-22 DIAGNOSIS — N400 Enlarged prostate without lower urinary tract symptoms: Secondary | ICD-10-CM

## 2023-10-22 DIAGNOSIS — Z125 Encounter for screening for malignant neoplasm of prostate: Secondary | ICD-10-CM

## 2023-10-23 LAB — COMPLETE METABOLIC PANEL WITH GFR
AG Ratio: 1.3 (calc) (ref 1.0–2.5)
ALT: 20 U/L (ref 9–46)
AST: 18 U/L (ref 10–35)
Albumin: 4.3 g/dL (ref 3.6–5.1)
Alkaline phosphatase (APISO): 65 U/L (ref 35–144)
BUN: 10 mg/dL (ref 7–25)
CO2: 29 mmol/L (ref 20–32)
Calcium: 9.7 mg/dL (ref 8.6–10.3)
Chloride: 97 mmol/L — ABNORMAL LOW (ref 98–110)
Creat: 1.04 mg/dL (ref 0.70–1.28)
Globulin: 3.2 g/dL (ref 1.9–3.7)
Glucose, Bld: 137 mg/dL — ABNORMAL HIGH (ref 65–99)
Potassium: 4.2 mmol/L (ref 3.5–5.3)
Sodium: 136 mmol/L (ref 135–146)
Total Bilirubin: 0.5 mg/dL (ref 0.2–1.2)
Total Protein: 7.5 g/dL (ref 6.1–8.1)
eGFR: 73 mL/min/{1.73_m2} (ref >=60)

## 2023-10-23 LAB — LIPID PANEL
Cholesterol: 173 mg/dL (ref <200)
HDL: 47 mg/dL (ref >=40)
LDL Cholesterol (Calc): 105 mg/dL — ABNORMAL HIGH
Non-HDL Cholesterol (Calc): 126 mg/dL (ref <130)
Total CHOL/HDL Ratio: 3.7 (calc) (ref <5.0)
Triglycerides: 118 mg/dL (ref <150)

## 2023-10-23 LAB — CBC
HCT: 46.1 % (ref 38.5–50.0)
Hemoglobin: 14.4 g/dL (ref 13.2–17.1)
MCH: 25.7 pg — ABNORMAL LOW (ref 27.0–33.0)
MCHC: 31.2 g/dL — ABNORMAL LOW (ref 32.0–36.0)
MCV: 82.3 fL (ref 80.0–100.0)
MPV: 12.4 fL (ref 7.5–12.5)
Platelets: 269 10*3/uL (ref 140–400)
RBC: 5.6 10*6/uL (ref 4.20–5.80)
RDW: 12.6 % (ref 11.0–15.0)
WBC: 6.6 10*3/uL (ref 3.8–10.8)

## 2023-10-23 LAB — TSH: TSH: 3.12 m[IU]/L (ref 0.40–4.50)

## 2023-10-23 LAB — HEMOGLOBIN A1C
Hgb A1c MFr Bld: 8.4 %{Hb} — ABNORMAL HIGH (ref <5.7)
Mean Plasma Glucose: 194 mg/dL
eAG (mmol/L): 10.8 mmol/L

## 2023-10-23 LAB — PSA: PSA: 2.76 ng/mL (ref <=4.00)

## 2023-10-23 LAB — MICROALBUMIN / CREATININE URINE RATIO
Creatinine, Urine: 43 mg/dL (ref 20–320)
Microalb Creat Ratio: 242 mg/g{creat} — ABNORMAL HIGH (ref <30)
Microalb, Ur: 10.4 mg/dL

## 2023-10-25 ENCOUNTER — Encounter: Payer: Self-pay | Admitting: Family Medicine

## 2023-10-25 ENCOUNTER — Ambulatory Visit: Payer: Medicare Other | Admitting: Family Medicine

## 2023-10-25 VITALS — BP 140/68 | HR 62 | Ht 68.0 in | Wt 219.0 lb

## 2023-10-25 DIAGNOSIS — I10 Essential (primary) hypertension: Secondary | ICD-10-CM | POA: Diagnosis not present

## 2023-10-25 DIAGNOSIS — E785 Hyperlipidemia, unspecified: Secondary | ICD-10-CM

## 2023-10-25 DIAGNOSIS — E1169 Type 2 diabetes mellitus with other specified complication: Secondary | ICD-10-CM | POA: Diagnosis not present

## 2023-10-25 LAB — POCT GLYCOSYLATED HEMOGLOBIN (HGB A1C): Hemoglobin A1C: 7 % — AB (ref 4.0–5.6)

## 2023-10-25 MED ORDER — METOPROLOL SUCCINATE ER 50 MG PO TB24: 50.0000 mg | ORAL_TABLET | Freq: Every day | ORAL | 1 refills | Status: DC

## 2023-10-25 MED ORDER — AMLODIPINE BESYLATE 10 MG PO TABS: 10.0000 mg | ORAL_TABLET | Freq: Every day | ORAL | 1 refills | Status: DC

## 2023-10-25 MED ORDER — ATORVASTATIN CALCIUM 20 MG PO TABS: 20.0000 mg | ORAL_TABLET | Freq: Every day | ORAL | 1 refills | Status: DC

## 2023-10-25 MED ORDER — HYDROCHLOROTHIAZIDE 25 MG PO TABS: 25.0000 mg | ORAL_TABLET | Freq: Every day | ORAL | 1 refills | Status: DC

## 2023-10-25 NOTE — Progress Notes (Signed)
Subjective:    Patient ID: Brady Sanchez, male    DOB: 19-Oct-1946, 77 y.o.   MRN: [id number]  Brady Sanchez is a 77 y.o. male presenting on 10/25/2023 for Medical Management of Chronic Issues   HPI  Discussed the use of AI scribe software for clinical note transcription with the patient, who gave verbal consent to proceed.      CHRONIC DM, Type 2 / Obesity BMI >33 Recent labs reviewed A1c 8.4 No new concerns. Not checking sugar regularly. CBGs: none recently Meds: Metformin 1000mg  BID Reports good compliance. Tolerating well w/o side-effects Currently on ACEi Lifestyle:  - Diet (low sugar low carb diet, trying to improve)  - Exercise (limited) He has not been back to [employer]. He tried but could not return to Dr Larence Penning. Denies hypoglycemia, polyuria, visual changes, numbness or tingling.   CHRONIC HTN: Not checking BP at home Current Meds - Amlodipine 10mg  daily, Lisinopril 40mg  daily, HCTZ 25mg  daily, Metoprolol XL 50mg  daily Reports good compliance, took meds today. Tolerating well, w/o complaints. Denies CP, dyspnea, edema, dizziness / lightheadedness, HA   HYPERLIPIDEMIA: - Reports no concerns. Last lipid panel 10/2023 elevated LDL 105 mild range - Currently taking Atorvastatin 20mg , tolerating well without side effects or myalgias Due for lab today    Health Maintenance:  Declines all vaccines including Pneumonia vaccine PSA 2.76 (10/2023)     10/25/2023    8:43 AM 08/03/2023    2:46 PM 07/28/2022   11:39 AM  Depression screen PHQ 2/9  Decreased Interest 0 0 0  Down, Depressed, Hopeless 0 0 0  PHQ - 2 Score 0 0 0  Altered sleeping  0 0  Tired, decreased energy  0 0  Change in appetite  0 0  Feeling bad or failure about yourself   0 0  Trouble concentrating  0 0  Moving slowly or fidgety/restless  0 0  Suicidal thoughts  0 0  PHQ-9 Score  0 0  Difficult doing work/chores  Not difficult at all Not difficult at all       10/25/2023    8:44  AM 09/13/2021    1:22 PM 12/28/2017    2:30 PM  GAD 7 : Generalized Anxiety Score  Nervous, Anxious, on Edge 0 0 0  Control/stop worrying 0 0 0  Worry too much - different things 0 0 0  Trouble relaxing 0 0 0  Restless 0 0 0  Easily annoyed or irritable 0 0 0  Afraid - awful might happen 0 0 0  Total GAD 7 Score 0 0 0  Anxiety Difficulty  Not difficult at all Not difficult at all     Past Medical History:  Diagnosis Date   Hyperlipidemia    Hypertension    Past Surgical History:  Procedure Laterality Date   CATARACT EXTRACTION     REPLACEMENT TOTAL KNEE     Social History   Socioeconomic History   Marital status: Widowed    Spouse name: Not on file   Number of children: Not on file   Years of education: Not on file   Highest education level: Not on file  Occupational History   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  Tobacco Use   Smoking status: Never   Smokeless tobacco: Never  Vaping Use   Vaping status: Never Used  Substance and Sexual Activity   Alcohol use: Yes   Drug use: No   Sexual activity: Not on file  Other Topics Concern   Not on file  Social History Narrative   Not on file   Social Drivers of Health   Financial Resource Strain: Low Risk  (10/09/2023)   Received from [facility]   Overall Financial Resource Strain (CARDIA)    Difficulty of Paying Living Expenses: Not very hard  Food Insecurity: Food Insecurity Present (10/09/2023)   Received [facility]   Hunger Vital Sign    Worried About Running Out of Food in the Last Year: Sometimes true    Ran Out of Food in the Last Year: Sometimes true  Transportation Needs: No Transportation Needs (10/09/2023)   Received from [facility] - Transportation    In the past 12 months, has lack of transportation kept you from medical appointments or from getting medications?: No    Lack of Transportation (Non-Medical): No   Physical Activity: Sufficiently Active (08/03/2023)   Exercise Vital Sign    Days of Exercise per Week: 5 days    Minutes of Exercise per Session: 60 min  Stress: No Stress Concern Present (08/03/2023)   [employer] of Occupational Health - Occupational Stress Questionnaire    Feeling of Stress : Only a little  Social Connections: Socially Isolated (08/03/2023)   Social Connection and Isolation Panel [NHANES]    Frequency of Communication with Friends and Family: More than three times a week    Frequency of Social Gatherings with Friends and Family: Twice a week    Attends Religious Services: Never    Database administrator or Organizations: No    Attends Banker Meetings: Never    Marital Status: Widowed  Intimate Partner Violence: Not At Risk (08/03/2023)   Humiliation, Afraid, Rape, and Kick questionnaire    Fear of Current or Ex-Partner: No    Emotionally Abused: No    Physically Abused: No    Sexually Abused: No   Family History  Problem Relation Age of Onset   Seizures Mother    Cancer Father        lung   Current Outpatient Medications on File Prior to Visit  Medication Sig   lisinopril (ZESTRIL) 40 MG tablet Take 1 tablet (40 mg total) by mouth daily.   metFORMIN (GLUCOPHAGE) 1000 MG tablet TAKE 1 TABLET (1,000 MG TOTAL) BY MOUTH TWICE A DAY WITH FOOD   triamcinolone cream (KENALOG) 0.5 % APPLY TO AFFECTED AREA EVERY DAY AS NEEDED   Albuterol Sulfate (PROAIR RESPICLICK) 108 (90 Base) MCG/ACT AEPB Inhale 2 puffs into the lungs every 4 (four) hours as needed.   fluticasone (FLONASE) 50 MCG/ACT nasal spray PLACE 2 SPRAYS INTO BOTH NOSTRILS DAILY. USE FOR 4-6 WEEKS THEN STOP AND USE SEASONALLY OR AS NEEDED. (Patient not taking: Reported on 08/03/2023)   No current facility-administered medications on file prior to visit.    Review of Systems Per HPI unless specifically indicated above     Objective:    BP (!) 140/68 (BP Location: Left Arm, Cuff Size:  Normal)   Pulse 62   Ht 5\' 8"  (1.727 m)   Wt 219 lb (99.3 kg)   BMI 33.30 kg/m   Wt Readings from Last 3 Encounters:  10/25/23 219 lb (99.3 kg)  08/03/23 214 lb (97.1 kg)  11/20/22 218 lb 12.8 oz (99.2 kg)    Physical Exam Vitals and nursing note reviewed.  Constitutional:      General: He is not in acute distress.  Appearance: He is well-developed. He is obese. He is not diaphoretic.     Comments: Well-appearing, comfortable, cooperative  HENT:     Head: Normocephalic and atraumatic.  Eyes:     General:        Right eye: No discharge.        Left eye: No discharge.     Conjunctiva/sclera: Conjunctivae normal.     Pupils: Pupils are equal, round, and reactive to light.  Neck:     Thyroid: No thyromegaly.     Vascular: No carotid bruit.  Cardiovascular:     Rate and Rhythm: Normal rate and regular rhythm.     Pulses: Normal pulses.     Heart sounds: Normal heart sounds. No murmur heard. Pulmonary:     Effort: Pulmonary effort is normal. No respiratory distress.     Breath sounds: Normal breath sounds. No wheezing or rales.  Abdominal:     General: Bowel sounds are normal. There is no distension.     Palpations: Abdomen is soft. There is no mass.     Tenderness: There is no abdominal tenderness.  Musculoskeletal:        General: No tenderness. Normal range of motion.     Cervical back: Normal range of motion and neck supple.     Right lower leg: No edema.     Left lower leg: No edema.     Comments: Upper / Lower Extremities: - Normal muscle tone, strength bilateral upper extremities 5/5, lower extremities 5/5  Lymphadenopathy:     Cervical: No cervical adenopathy.  Skin:    General: Skin is warm and dry.     Findings: No erythema or rash.  Neurological:     Mental Status: He is alert and oriented to person, place, and time.     Comments: Distal sensation intact to light touch all extremities  Psychiatric:        Mood and Affect: Mood normal.        Behavior:  Behavior normal.        Thought Content: Thought content normal.     Comments: Well groomed, good eye contact, normal speech and thoughts     Diabetic Foot Exam - Simple   Simple Foot Form Diabetic Foot exam was performed with the following findings: Yes 10/25/2023  9:10 AM  Visual Inspection No deformities, no ulcerations, no other skin breakdown bilaterally: Yes Sensation Testing Intact to touch and monofilament testing bilaterally: Yes Pulse Check Posterior Tibialis and Dorsalis pulse intact bilaterally: Yes Comments      Results for orders placed or performed in visit on 10/25/23  POCT HgB A1C   Collection Time: 10/25/23  8:50 AM  Result Value Ref Range   Hemoglobin A1C 7.0 (A) 4.0 - 5.6 %   HbA1c POC (<> result, manual entry)     HbA1c, POC (prediabetic range)     HbA1c, POC (controlled diabetic range)        Assessment & Plan:   Problem List Items Addressed This Visit     Essential hypertension   Relevant Medications   amLODipine (NORVASC) 10 MG tablet   atorvastatin (LIPITOR) 20 MG tablet   hydrochlorothiazide (HYDRODIURIL) 25 MG tablet   metoprolol succinate (TOPROL-XL) 50 MG 24 hr tablet   Other Relevant Orders   AMB Referral VBCI Care Management   Hyperlipidemia associated with type 2 diabetes mellitus (HCC)   Relevant Medications   amLODipine (NORVASC) 10 MG tablet   atorvastatin (LIPITOR) 20 MG tablet  hydrochlorothiazide (HYDRODIURIL) 25 MG tablet   metoprolol succinate (TOPROL-XL) 50 MG 24 hr tablet   Other Relevant Orders   AMB Referral VBCI Care Management   Type 2 diabetes mellitus with other specified complication (HCC) - Primary   Relevant Medications   atorvastatin (LIPITOR) 20 MG tablet   Other Relevant Orders   POCT HgB A1C (Completed)   AMB Referral VBCI Care Management     Updated Health Maintenance information Reviewed recent lab results with patient Encouraged improvement to lifestyle with diet and exercise Goal of weight  loss   Type 2 Diabetes Mellitus Elevated HbA1c (8.4), however repeat POC down to 7.0 uncertainty on accuracy of POC today given recent history with our machine Patient acknowledges potential dietary contributors. Discussed the possibility of adding additional antidiabetic medications, but patient prefers to attempt lifestyle modifications first. -Encourage dietary modifications and regular exercise. -Consider additional antidiabetic medication (SGLT2, GLP1, DPP4)  if HbA1c remains elevated at next visit. -Refer to clinical pharmacist + RN CM for further education on blood glucose monitoring and management, he will need appropriate preferred glucometer and education  Hypertension Blood pressure slightly elevated (158/88) despite current medication regimen. Repeat reading improved, still not at goal Discussed the possibility of changing Lisinopril to a different class of antihypertensive (Losartan or Valsartan), but patient prefers to maintain current regimen. -Encourage regular blood pressure monitoring at home. -Consider medication adjustment if blood pressure remains elevated at next visit.  Hyperlipidemia Slight increase in cholesterol levels despite current medication regimen. Continue Atorvastatin 20mg  daily  General Health Maintenance -Encourage patient to schedule eye exam for diabetic retinopathy screening. -Declined pneumococcal vaccination. -Continue to monitor foot health due to diabetes.  Follow-up in 6 months or sooner if patient has concerns or if blood glucose or blood pressure readings are consistently elevated.      Orders Placed This Encounter  Procedures   AMB Referral VBCI Care Management    Referral Priority:   Routine    Referral Type:   Consultation    Referral Reason:   Care Coordination    Number of Visits Requested:   1   POCT HgB A1C    Meds ordered this encounter  Medications   amLODipine (NORVASC) 10 MG tablet    Sig: Take 1 tablet (10 mg total)  by mouth daily.    Dispense:  90 tablet    Refill:  1   atorvastatin (LIPITOR) 20 MG tablet    Sig: Take 1 tablet (20 mg total) by mouth daily.    Dispense:  90 tablet    Refill:  1   hydrochlorothiazide (HYDRODIURIL) 25 MG tablet    Sig: Take 1 tablet (25 mg total) by mouth daily.    Dispense:  90 tablet    Refill:  1   metoprolol succinate (TOPROL-XL) 50 MG 24 hr tablet    Sig: Take 1 tablet (50 mg total) by mouth daily. for high blood pressure    Dispense:  90 tablet    Refill:  1     Follow up plan: Return in about 6 months (around 04/24/2024) for 6 month DM A1c, HTN.  Saralyn Pilar, DO [facility] 10/25/2023, 8:55 AM

## 2023-10-25 NOTE — Patient Instructions (Addendum)
Thank you for coming to the office today.  Refilled medications  Gentry Fitz our clinical pharmacist will call you for more advice on the blood sugar testing and BP monitoring.  We can consider a med change next time if needed.  Recent Labs    10/22/23 0803 10/25/23 0850  HGBA1C 8.4* 7.0*   BP improved on repeat check.  Please schedule a Follow-up Appointment to: Return in about 6 months (around 04/24/2024) for 6 month DM A1c, HTN.  If you have any other questions or concerns, please feel free to call the office or send a message through MyChart. You may also schedule an earlier appointment if necessary.  Additionally, you may be receiving a survey about your experience at our office within a few days to 1 week by e-mail or mail. We value your feedback.  Saralyn Pilar, DO [facility], [place]

## 2023-11-01 LAB — HM DIABETES EYE EXAM

## 2023-11-05 ENCOUNTER — Telehealth: Payer: Self-pay | Admitting: Pharmacist

## 2023-11-05 ENCOUNTER — Other Ambulatory Visit: Payer: Medicare Other

## 2023-11-05 NOTE — Progress Notes (Signed)
   Outreach Note  11/05/2023 Name: Brady Sanchez MRN: [id number] DOB: [birth year]  Referred by: Smitty Cords, DO   Reach patient by telephone today. However he states that now is not a good time to talk and requests to reschedule. Rescheduled as requested  Follow Up Plan: Clinical Pharmacist will outreach to patient by telephone on 11/09/2023 at 8:30 am  Estelle Grumbles, PharmD, Patsy [place], CPP Clinical Pharmacist [employer] [phone]

## 2023-11-08 ENCOUNTER — Ambulatory Visit: Payer: Self-pay

## 2023-11-08 NOTE — Telephone Encounter (Signed)
Summary: Inquiry about medication   Pt is calling to see if he can take Musinex 200 mg for congestion and cough. No fever.          Chief Complaint: Cough, head congestion. Asking what OTC to take. Appointment made due to symptoms. Symptoms: Above Frequency: Monday Pertinent Negatives: Patient denies  Disposition: [] ED /[] Urgent Care (no appt availability in office) / [x] Appointment(In office/virtual)/ []  [place] [facility]/ [] Home Care/ [] Refused Recommended Disposition /[] [facility]/ []  Follow-up with PCP Additional Notes: Agrees with appointment.  Reason for Disposition  [1] Caller has URGENT medicine question about med that PCP or specialist prescribed AND [2] triager unable to answer question  Answer Assessment - Initial Assessment Questions 1. NAME of MEDICINE: "What medicine(s) are you calling about?"     Mucinex 2. QUESTION: "What is your question?" (e.g., double dose of medicine, side effect)     Can I take this? 3. PRESCRIBER: "Who prescribed the medicine?" Reason: if prescribed by specialist, call should be referred to that group.     OTC 4. SYMPTOMS: "Do you have any symptoms?" If Yes, ask: "What symptoms are you having?"  "How bad are the symptoms (e.g., mild, moderate, severe)     Cough, congestion 5. PREGNANCY:  "Is there any chance that you are pregnant?" "When was your last menstrual period?"     N/a  Protocols used: Medication Question Call-A-AH

## 2023-11-09 ENCOUNTER — Other Ambulatory Visit: Payer: Medicare Other | Admitting: Pharmacist

## 2023-11-09 ENCOUNTER — Encounter: Payer: Self-pay | Admitting: Pharmacist

## 2023-11-09 ENCOUNTER — Encounter: Payer: Self-pay | Admitting: Internal Medicine

## 2023-11-09 ENCOUNTER — Ambulatory Visit (INDEPENDENT_AMBULATORY_CARE_PROVIDER_SITE_OTHER): Payer: Medicare Other | Admitting: Internal Medicine

## 2023-11-09 VITALS — BP 138/70 | HR 69 | Ht 68.0 in | Wt 219.0 lb

## 2023-11-09 DIAGNOSIS — J00 Acute nasopharyngitis [common cold]: Secondary | ICD-10-CM

## 2023-11-09 DIAGNOSIS — E1169 Type 2 diabetes mellitus with other specified complication: Secondary | ICD-10-CM

## 2023-11-09 MED ORDER — BLOOD GLUCOSE MONITORING SUPPL DEVI: 0 refills | Status: AC

## 2023-11-09 MED ORDER — BLOOD GLUCOSE TEST VI STRP: ORAL_STRIP | 1 refills | Status: DC

## 2023-11-09 MED ORDER — LANCETS MISC. MISC: 1 refills | Status: AC

## 2023-11-09 MED ORDER — AZITHROMYCIN 250 MG PO TABS: ORAL_TABLET | ORAL | 0 refills | Status: DC

## 2023-11-09 MED ORDER — BENZONATATE 100 MG PO CAPS: 100.0000 mg | ORAL_CAPSULE | Freq: Three times a day (TID) | ORAL | 0 refills | Status: DC | PRN

## 2023-11-09 NOTE — Patient Instructions (Signed)
Goals Addressed             This Visit's Progress    Pharmacy Goals       The following is the handout on meal planning that we discussed. Please copy and paste the web address below into your internet browser:   [email or web address]     Also, the following is the web address for the video from manufacturer for the Accu Check Guide blood sugar monitor:   https://fox-cook.com/     The goal A1c is less than 7%. This is the best way to reduce the risk of the long term complications of diabetes, including heart disease, kidney disease, eye disease, strokes, and nerve damage. An A1c of less than 7% corresponds with fasting sugars less than 130 and 2 hour after meal sugars less than 180. Please start to monitor your blood sugar at home and keep a record of the results for [place] to review during our next call.   Thank you!   Estelle Grumbles, PharmD, Patsy [place], CPP Clinical Pharmacist [facility] [phone]

## 2023-11-09 NOTE — Progress Notes (Signed)
11/09/2023 Name: Brady Sanchez MRN: [id number] DOB: [birth year]  Chief Complaint  Patient presents with   Medication Management   Medication Adherence    Brady Sanchez is a 77 y.o. year old male who presented for a telephone visit.   They were referred to the pharmacist by their PCP for assistance in managing diabetes, hypertension, and medication access.    Subjective:  Care Team: Primary Care Provider: Smitty Cords, DO ; Next Scheduled Visit: 04/24/2024   Medication Access/Adherence  Current Pharmacy:  [facility] [phone] [address], [place] - [address] [place] [address] Phone: [phone] Fax: [phone]   Patient reports affordability concerns with their medications: No  Patient reports access/transportation concerns to their pharmacy: No  Patient reports adherence concerns with their medications:  Yes    Denies using weekly pillbox; reports takes his medications directly from pill bottles. - Admits to sometimes missing evening dose of metformin.   Patient currently has cough and head congestion - Note patient scheduled for appointment with NP Nicki Reaper this afternoon  Diabetes:  Current medications: metformin 1000 mg twice daily  Denies interest in making any changes to his medications at this time  Denies monitoring home blood sugar as denies having a glucometer  Patient denies hypoglycemic s/sx including dizziness, shakiness, sweating. Patient denies hyperglycemic symptoms including polyuria, polydipsia, polyphagia, nocturia, neuropathy, blurred vision.  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 Note record sent to clinic by Dr. Larence Penning   Hypertension:  Current medications:  - amlodipine 10 mg daily - HCTZ 25 mg daily - lisinopril 40 mg daily  - metoprolol ER 50 mg daily  Medications previously tried:   Patient does not have a home BP monitor; denies monitoring home blood pressure  Admits to adding salt to his food  Patient denies hypotensive s/sx including dizziness, lightheadedness.   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   Objective:  Lab Results  Component Value Date   HGBA1C 7.0 (A) 10/25/2023    Lab Results  Component Value Date   CREATININE 1.04 10/22/2023   BUN 10 10/22/2023   NA 136 10/22/2023   K 4.2 10/22/2023   CL 97 (L) 10/22/2023   CO2 29 10/22/2023    Lab Results  Component Value Date   CHOL 173 10/22/2023   HDL 47 10/22/2023   LDLCALC 105 (H) 10/22/2023   TRIG 118 10/22/2023   CHOLHDL 3.7 10/22/2023   BP Readings from Last 3 Encounters:  10/25/23 (!) 140/68  08/03/23 130/70  11/20/22 136/74   Pulse Readings from Last 3 Encounters:  10/25/23 62  11/20/22 62  10/09/22 65     Medications Reviewed Today     Reviewed by Manuela Neptune, RPH-CPP (Pharmacist) on 11/09/23 at 0904  Med List Status: <None>   Medication Order Taking? Sig Documenting Provider Last Dose Status Informant  amLODipine (NORVASC) 10 MG tablet [id number] Yes Take 1 tablet (10 mg total) by mouth daily. Smitty Cords, DO Taking Active   atorvastatin (  LIPITOR) 20 MG tablet [id number] Yes Take 1 tablet (20 mg total) by mouth daily. Smitty Cords, DO Taking Active   hydrochlorothiazide (HYDRODIURIL) 25 MG tablet [id number] Yes Take 1 tablet (25 mg total) by mouth daily. Smitty Cords, DO Taking Active   lisinopril (ZESTRIL) 40 MG tablet [id number] Yes Take 1 tablet (40 mg total) by mouth  daily. Smitty Cords, DO Taking Active   metFORMIN (GLUCOPHAGE) 1000 MG tablet [id number] Yes TAKE 1 TABLET (1,000 MG TOTAL) BY MOUTH TWICE A DAY WITH FOOD Althea Charon, Netta Neat, DO Taking Active   metoprolol succinate (TOPROL-XL) 50 MG 24 hr tablet [id number] Yes Take 1 tablet (50 mg total) by mouth daily. for high blood pressure Karamalegos, Netta Neat, DO Taking Active   triamcinolone cream (KENALOG) 0.5 % [id number] Yes APPLY TO AFFECTED AREA EVERY DAY AS NEEDED Smitty Cords, DO Taking Active               Assessment/Plan:   Discuss strategies to aid with medication adherence including using weekly pillbox, moving the location of his medications and/or using daily phone alarm   Diabetes: - Reviewed long term cardiovascular and renal outcomes of uncontrolled blood sugar - Reviewed goal A1c, goal fasting, and goal 2 hour post prandial glucose - Reviewed dietary modifications including importance of having regular well-balanced meals and snacks throughout the day, while controlling carbohydrate portion sizes             Encourage patient to avoid consumption of sugary beverages             Encourage patient to increase consumption of non-starchy vegetables - [facility]reach to [facility] on behalf of patient to determine patient's current prescription insurance coverage Pharmacy advises that his prescriptions have been going through a discount card as pharmacy does not have active insurance coverage on file for patient. Reports per Medicare eligibility search, appears patient does not have Medicare prescription coverage From review of chart, note patient has a Nurse, learning disability card. Provide this billing information to pharmacy. Find Accu Check products preferred under his plan. Send prescriptions for testing supplies to pharmacy for patient - Recommend to start to check glucose, keep log of results and have this record to review at upcoming medical  appointments. Patient to contact provider office sooner if needed for readings outside of established parameters or symptoms - Send patient link for handout on healthy eating with diabetes and link for "how to use" video for Accu Check Guide glucometer as requested  Hypertension: - Currently uncontrolled - Reviewed long term cardiovascular and renal outcomes of uncontrolled blood pressure - Counsel patient on impact of salt/sodium on blood pressure. Encourage patient to reduce salt and sodium intake - Encourage patient to consider obtaining an upper arm blood pressure monitor and start to check his blood pressure at home    Follow Up Plan: Clinical Pharmacist will follow up with patient by telephone on 12/17/2023 at 1:00 PM   Estelle Grumbles, PharmD, P[place]y [place], CPP Clinical Pharmacist [facility] [phone]

## 2023-11-09 NOTE — Patient Instructions (Signed)

## 2023-11-09 NOTE — Progress Notes (Signed)
Subjective:    Patient ID: Brady Sanchez, male    DOB: August 17, 1946, 77 y.o.   MRN: [id number]  HPI  Discussed the use of AI scribe software for clinical note transcription with the patient, who gave verbal consent to proceed.  The patient, with a five-day history of upper respiratory symptoms, initially presented with a sore and scratchy throat, which has since improved. He reports significant nasal congestion and a productive cough, with colored sputum noted both when coughing and blowing the nose. Despite these symptoms, he denies any associated headache, ear pain, shortness of breath, nausea, vomiting, diarrhea, fever, or body aches. He did, however, note a change in the quality of his throat discomfort, attributing it to the persistent coughing.  Over-the-counter treatments, including Tussin, Benadryl, and Ibuprofen, have been used to manage the symptoms with limited success. The patient denies any typical seasonal allergies. Prior to the consultation, he was advised by his supervisor at work to take some time off due to his illness.       Review of Systems   Past Medical History:  Diagnosis Date   Hyperlipidemia    Hypertension     Current Outpatient Medications  Medication Sig Dispense Refill   amLODipine (NORVASC) 10 MG tablet Take 1 tablet (10 mg total) by mouth daily. 90 tablet 1   atorvastatin (LIPITOR) 20 MG tablet Take 1 tablet (20 mg total) by mouth daily. 90 tablet 1   hydrochlorothiazide (HYDRODIURIL) 25 MG tablet Take 1 tablet (25 mg total) by mouth daily. 90 tablet 1   lisinopril (ZESTRIL) 40 MG tablet Take 1 tablet (40 mg total) by mouth daily. 90 tablet 3   metFORMIN (GLUCOPHAGE) 1000 MG tablet TAKE 1 TABLET (1,000 MG TOTAL) BY MOUTH TWICE A DAY WITH FOOD 180 tablet 3   metoprolol succinate (TOPROL-XL) 50 MG 24 hr tablet Take 1 tablet (50 mg total) by mouth daily. for high blood pressure 90 tablet 1   triamcinolone cream (KENALOG) 0.5 % APPLY TO AFFECTED AREA  EVERY DAY AS NEEDED 15 g 3   No current facility-administered medications for this visit.    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  Tobacco Use   Smoking status: Never   Smokeless tobacco: Never  Vaping Use   Vaping status: Never Used  Substance and Sexual Activity   Alcohol use: Yes   Drug use: No   Sexual activity: Not on file  Other Topics Concern   Not on file  Social History Narrative   Not on file   Social Drivers of Health   Financial Resource Strain: Low Risk  (10/09/2023)   Received from [facility]   Overall Financial Resource Strain (CARDIA)    Difficulty of Paying Living Expenses: Not very hard  Food Insecurity: Food Insecurity Present (10/09/2023)   Received from [facility]   Hunger Vital Sign    Worried About Running Out of Food in the Last Year:  Sometimes true    Ran Out of Food in the Last Year: Sometimes true  Transportation Needs: No Transportation Needs (10/09/2023)   Received from [facility] - Transportation    In the past 12 months, has lack of transportation kept you from medical appointments or from getting medications?: No    Lack of Transportation (Non-Medical): No  Physical Activity: Sufficiently Active (08/03/2023)   Exercise Vital Sign    Days of Exercise per Week: 5 days    Minutes of Exercise per Session: 60 min  Stress: No Stress Concern Present (08/03/2023)   [employer] of Occupational Health - Occupational Stress  Questionnaire    Feeling of Stress : Only a little  Social Connections: Socially Isolated (08/03/2023)   Social Connection and Isolation Panel [NHANES]    Frequency of Communication with Friends and Family: More than three times a week    Frequency of Social Gatherings with Friends and Family: Twice a week    Attends Religious Services: Never    Database administrator or Organizations: No    Attends Banker Meetings: Never    Marital Status: Widowed  Intimate Partner Violence: Not At Risk (08/03/2023)   Humiliation, Afraid, Rape, and Kick questionnaire    Fear of Current or Ex-Partner: No    Emotionally Abused: No    Physically Abused: No    Sexually Abused: No     Constitutional: Denies fever, malaise, fatigue, headache or abrupt weight changes.  HEENT: Pt reports nasal congestion, sore throat. Denies eye pain, eye redness, ear pain, ringing in the ears, wax buildup, runny nose, bloody nose. Respiratory: Pt reports cough. Denies difficulty breathing, shortness of breath, cough or sputum production.   Cardiovascular: Denies chest pain, chest tightness, palpitations or swelling in the hands or feet.  Gastrointestinal: Denies abdominal pain, bloating, constipation, diarrhea or blood in the stool.  GU: Denies urgency, frequency, pain with urination, burning sensation, blood in urine, odor or discharge. Musculoskeletal: Denies decrease in range of motion, difficulty with gait, muscle pain or joint pain and swelling.  Skin: Denies redness, rashes, lesions or ulcercations.  Neurological: Denies dizziness, difficulty with memory, difficulty with speech or problems with balance and coordination.  Psych: Denies anxiety, depression, SI/HI.  No other specific complaints in a complete review of systems (except as listed in HPI above).      Objective:   Physical Exam  BP 138/70 (BP Location: Left Arm, Patient Position: Sitting, Cuff Size: Large)   Pulse 69   Ht 5\' 8"  (1.727 m)    Wt 219 lb (99.3 kg)   SpO2 98%   BMI 33.30 kg/m   Wt Readings from Last 3 Encounters:  10/25/23 219 lb (99.3 kg)  08/03/23 214 lb (97.1 kg)  11/20/22 218 lb 12.8 oz (99.2 kg)    General: Appears his stated age, obese, in NAD. Skin: Warm, dry and intact.  HEENT: Head: normal shape and size, no sinus tenderness noted; Eyes: sclera white, no icterus, conjunctiva pink, PERRLA and EOMs intact;  Nose: mucosa boggy and moist, turban swollen; Throat/Mouth: Teeth present, mucosa pink and moist, + PND, no exudate, lesions or ulcerations noted.  Neck:  No adenopathy noted. Cardiovascular: Normal rate and rhythm.  Pulmonary/Chest: Normal effort and positive vesicular breath sounds. No respiratory distress. No wheezes, rales or ronchi noted.  Neurological: Alert and oriented.   BMET    Component Value Date/Time   NA 136 10/22/2023 0803   NA 141 01/30/2012  2308   K 4.2 10/22/2023 0803   K 3.6 01/30/2012 2308   CL 97 (L) 10/22/2023 0803   CL 99 01/30/2012 2308   CO2 29 10/22/2023 0803   CO2 28 01/30/2012 2308   GLUCOSE 137 (H) 10/22/2023 0803   GLUCOSE 160 (H) 01/30/2012 2308   BUN 10 10/22/2023 0803   BUN 16 01/30/2012 2308   CREATININE 1.04 10/22/2023 0803   CALCIUM 9.7 10/22/2023 0803   CALCIUM 8.8 01/30/2012 2308   GFRNONAA 71 06/14/2020 0820   GFRAA 83 06/14/2020 0820    Lipid Panel     Component Value Date/Time   CHOL 173 10/22/2023 0803   TRIG 118 10/22/2023 0803   HDL 47 10/22/2023 0803   CHOLHDL 3.7 10/22/2023 0803   LDLCALC 105 (H) 10/22/2023 0803    CBC    Component Value Date/Time   WBC 6.6 10/22/2023 0803   RBC 5.60 10/22/2023 0803   HGB 14.4 10/22/2023 0803   HGB 14.1 01/30/2012 2308   HCT 46.1 10/22/2023 0803   HCT 42.7 01/30/2012 2308   PLT 269 10/22/2023 0803   PLT 225 01/30/2012 2308   MCV 82.3 10/22/2023 0803   MCV 81 01/30/2012 2308   MCH 25.7 (L) 10/22/2023 0803   MCHC 31.2 (L) 10/22/2023 0803   RDW 12.6 10/22/2023 0803   RDW 13.0 01/30/2012  2308   LYMPHSABS 1,280 10/09/2022 1032   MONOABS 0.8 07/05/2018 0644   EOSABS 124 10/09/2022 1032   BASOSABS 30 10/09/2022 1032    Hgb A1C Lab Results  Component Value Date   HGBA1C 7.0 (A) 10/25/2023            Assessment & Plan:    Assessment and Plan    Upper Respiratory Infection Nasal congestion, cough, and colored sputum for 5 days. No fever, body aches, or shortness of breath. Physical exam reveals swollen nasal mucosa and postnasal drainage. No signs of bronchitis or pneumonia. -Prescribe Azithromycin (Z-Pak). -Prescribe Tessalon for cough. -If no improvement by next week, patient to notify the office.       Follow-up with your PCP as previously scheduled Nicki Reaper, NP

## 2023-11-12 ENCOUNTER — Ambulatory Visit: Payer: Self-pay

## 2023-11-12 NOTE — Telephone Encounter (Signed)
  Chief Complaint: cough FU  Symptoms: cough not getting better, has coughing fits  Frequency: 7+ days  Pertinent Negatives: Patient denies SOB Disposition: [] ED /[] Urgent Care (no appt availability in office) / [] Appointment(In office/virtual)/ []  [facility]/ [] Home Care/ [] Refused Recommended Disposition /[] [facility]/ [x]  Follow-up with PCP Additional Notes: pt states doesn't feel like he is getting relief with the Tessalon, more with the Zpac. Pt states he is coughing up mucus as well. Asking if something different can be sent to pharmacy. Advised him I would send message to provider for review.  [id number] [place], [place] - [address]    Summary: chronic cough   Chronic cough, congestion, medication not working benzonatate (TESSALON) 100 MG capsule, need something different.         Reason for Disposition  [1] Continuous (nonstop) coughing interferes with work or school AND [2] no improvement using cough treatment per Care Advice  Answer Assessment - Initial Assessment Questions 1. ONSET: "When did the cough begin?"      7+ days  2. SEVERITY: "How bad is the cough today?"      No improvement  3. SPUTUM: "Describe the color of your sputum" (none, dry cough; clear, white, yellow, green)     yes 5. DIFFICULTY BREATHING: "Are you having difficulty breathing?" If Yes, ask: "How bad is it?" (e.g., mild, moderate, severe)    - MILD: No SOB at rest, mild SOB with walking, speaks normally in sentences, can lie down, no retractions, pulse < 100.    - MODERATE: SOB at rest, SOB with minimal exertion and prefers to sit, cannot lie down flat, speaks in phrases, mild retractions, audible wheezing, pulse 100-120.    - SEVERE: Very SOB at rest, speaks in single words, struggling to breathe, sitting hunched forward, retractions, pulse > 120      no  Protocols used: Cough - Acute Productive-A-AH

## 2023-11-19 ENCOUNTER — Other Ambulatory Visit: Payer: Self-pay

## 2023-11-19 DIAGNOSIS — E1169 Type 2 diabetes mellitus with other specified complication: Secondary | ICD-10-CM

## 2023-11-19 MED ORDER — BLOOD GLUCOSE TEST VI STRP: ORAL_STRIP | 1 refills | Status: AC

## 2023-12-17 ENCOUNTER — Other Ambulatory Visit: Payer: No Typology Code available for payment source

## 2024-02-07 ENCOUNTER — Other Ambulatory Visit: Payer: Self-pay | Admitting: Family Medicine

## 2024-02-07 DIAGNOSIS — I10 Essential (primary) hypertension: Secondary | ICD-10-CM

## 2024-02-08 ENCOUNTER — Other Ambulatory Visit: Payer: Self-pay | Admitting: Family Medicine

## 2024-02-08 DIAGNOSIS — L309 Dermatitis, unspecified: Secondary | ICD-10-CM

## 2024-02-08 NOTE — Telephone Encounter (Signed)
 Requested medication (s) are due for refill today: yes  Requested medication (s) are on the active medication list: yes  Last refill:  10/25/23 #90/1  Future visit scheduled: yes  Notes to clinic:  Pharmacy comment: Alternative Requested:MAX FILLS LIMIT REACHED PER PLAN. PLEASE SEND NEW SCRIPT.      Requested Prescriptions  Pending Prescriptions Disp Refills   metoprolol succinate (TOPROL-XL) 50 MG 24 hr tablet [Pharmacy Med Name: METOPROLOL SUCC ER 50 MG TAB]  0     There is no refill protocol information for this order

## 2024-02-11 NOTE — Telephone Encounter (Signed)
 Requested medication (s) are due for refill today: Yes  Requested medication (s) are on the active medication list: Yes  Last refill:     Future visit scheduled: Yes  Notes to clinic:  Not delegated.    Requested Prescriptions  Pending Prescriptions Disp Refills   triamcinolone cream (KENALOG) 0.5 % [Pharmacy Med Name: TRIAMCINOLONE 0.5% CREAM] 15 g 3    Sig: APPLY TO AFFECTED AREA EVERY DAY AS NEEDED     Not Delegated - Dermatology:  Corticosteroids Failed - 02/11/2024  3:18 PM      Failed - This refill cannot be delegated      Failed - Valid encounter within last 12 months    Recent Outpatient Visits   None

## 2024-02-20 ENCOUNTER — Other Ambulatory Visit: Payer: Self-pay | Admitting: Family Medicine

## 2024-02-20 DIAGNOSIS — E1169 Type 2 diabetes mellitus with other specified complication: Secondary | ICD-10-CM

## 2024-02-21 NOTE — Telephone Encounter (Signed)
 Requested medications are due for refill today.  No - see note from pharmacy  Requested medications are on the active medications list.  yes  Last refill. 08/27/2023 #180 3 rf  Future visit scheduled.   yes  Notes to clinic.  Pharmacy comment: Alternative Requested:MAX FILLS LIMIT REACHED PER PLAN. PLEASE SEND NEW SCRIPT.    Requested Prescriptions  Pending Prescriptions Disp Refills   metFORMIN (GLUCOPHAGE) 1000 MG tablet [Pharmacy Med Name: METFORMIN HCL 1,000 MG TABLET]  0     Endocrinology:  Diabetes - Biguanides Failed - 02/21/2024  8:29 AM      Failed - B12 Level in normal range and within 720 days    No results found for: "VITAMINB12"       Failed - Valid encounter within last 6 months    Recent Outpatient Visits   None            Failed - CBC within normal limits and completed in the last 12 months    WBC  Date Value Ref Range Status  10/22/2023 6.6 3.8 - 10.8 Thousand/uL Final   RBC  Date Value Ref Range Status  10/22/2023 5.60 4.20 - 5.80 Million/uL Final   Hemoglobin  Date Value Ref Range Status  10/22/2023 14.4 13.2 - 17.1 g/dL Final   HGB  Date Value Ref Range Status  01/30/2012 14.1 13.0 - 18.0 g/dL Final   HCT  Date Value Ref Range Status  10/22/2023 46.1 38.5 - 50.0 % Final  01/30/2012 42.7 40.0 - 52.0 % Final   MCHC  Date Value Ref Range Status  10/22/2023 31.2 (L) 32.0 - 36.0 g/dL Final    Comment:    For adults, a slight decrease in the calculated MCHC value (in the range of 30 to 32 g/dL) is most likely not clinically significant; however, it should be interpreted with caution in correlation with other red cell parameters and the patient's clinical condition.    [facility]  Date Value Ref Range Status  10/22/2023 25.7 (L) 27.0 - 33.0 pg Final   MCV  Date Value Ref Range Status  10/22/2023 82.3 80.0 - 100.0 fL Final  01/30/2012 81 80 - 100 fL Final   No results found for: "PLTCOUNTKUC", "LABPLAT", "POCPLA" RDW  Date Value Ref Range  Status  10/22/2023 12.6 11.0 - 15.0 % Final  01/30/2012 13.0 11.5 - 14.5 % Final         Passed - Cr in normal range and within 360 days    Creat  Date Value Ref Range Status  10/22/2023 1.04 0.70 - 1.28 mg/dL Final   Creatinine, Urine  Date Value Ref Range Status  10/22/2023 43 20 - 320 mg/dL Final         Passed - HBA1C is between 0 and 7.9 and within 180 days    Hemoglobin A1C  Date Value Ref Range Status  10/25/2023 7.0 (A) 4.0 - 5.6 % Final  05/13/2018 7.9  Final   Hgb A1c MFr Bld  Date Value Ref Range Status  10/22/2023 8.4 (H) <5.7 % of total Hgb Final    Comment:    For someone without known diabetes, a hemoglobin A1c value of 6.5% or greater indicates that they may have  diabetes and this should be confirmed with a follow-up  test. . For someone with known diabetes, a value <7% indicates  that their diabetes is well controlled and a value  greater than or equal to 7% indicates suboptimal  control. A1c targets  should be individualized based on  duration of diabetes, age, comorbid conditions, and  other considerations. . Currently, no consensus exists regarding use of hemoglobin A1c for diagnosis of diabetes for children. .          Passed - eGFR in normal range and within 360 days    GFR, Est African American  Date Value Ref Range Status  06/14/2020 83 > OR = 60 mL/min/1.29m2 Final   GFR, Est Non African American  Date Value Ref Range Status  06/14/2020 71 > OR = 60 mL/min/1.54m2 Final   eGFR  Date Value Ref Range Status  10/22/2023 73 > OR = 60 mL/min/1.53m2 Final

## 2024-04-17 ENCOUNTER — Other Ambulatory Visit: Payer: Self-pay | Admitting: Family Medicine

## 2024-04-17 DIAGNOSIS — I10 Essential (primary) hypertension: Secondary | ICD-10-CM

## 2024-04-17 DIAGNOSIS — E1169 Type 2 diabetes mellitus with other specified complication: Secondary | ICD-10-CM

## 2024-04-18 ENCOUNTER — Other Ambulatory Visit: Payer: Self-pay | Admitting: Family Medicine

## 2024-04-18 DIAGNOSIS — I10 Essential (primary) hypertension: Secondary | ICD-10-CM

## 2024-04-18 NOTE — Telephone Encounter (Signed)
 Requested Prescriptions  Pending Prescriptions Disp Refills   atorvastatin  (LIPITOR) 20 MG tablet [Pharmacy Med Name: ATORVASTATIN  20 MG TABLET] 90 tablet 0    Sig: TAKE 1 TABLET BY MOUTH EVERY DAY     Cardiovascular:  Antilipid - Statins Failed - 04/18/2024  9:57 AM      Failed - Valid encounter within last 12 months    Recent Outpatient Visits   None            Failed - Lipid Panel in normal range within the last 12 months    Cholesterol  Date Value Ref Range Status  10/22/2023 173 <200 mg/dL Final   LDL Cholesterol (Calc)  Date Value Ref Range Status  10/22/2023 105 (H) mg/dL (calc) Final    Comment:    Reference range: <100 . Desirable range <100 mg/dL for primary prevention;   <70 mg/dL for patients with CHD or diabetic patients  with > or = 2 CHD risk factors. Aaron Aas LDL-C is now calculated using the Martin-Hopkins  calculation, which is a validated novel method providing  better accuracy than the Friedewald equation in the  estimation of LDL-C.  Melinda Sprawls et al. Erroll Heard. 4098;119(14): 2061-2068  (http://education.[facility].com/faq/FAQ164)    HDL  Date Value Ref Range Status  10/22/2023 47 > OR = 40 mg/dL Final   Triglycerides  Date Value Ref Range Status  10/22/2023 118 <150 mg/dL Final         Passed - Patient is not pregnant       hydrochlorothiazide  (HYDRODIURIL ) 25 MG tablet [Pharmacy Med Name: HYDROCHLOROTHIAZIDE  25 MG TAB] 90 tablet 0    Sig: TAKE 1 TABLET (25 MG TOTAL) BY MOUTH DAILY.     Cardiovascular: Diuretics - Thiazide Failed - 04/18/2024  9:57 AM      Failed - Valid encounter within last 6 months    Recent Outpatient Visits   None            Passed - Cr in normal range and within 180 days    Creat  Date Value Ref Range Status  10/22/2023 1.04 0.70 - 1.28 mg/dL Final   Creatinine, Urine  Date Value Ref Range Status  10/22/2023 43 20 - 320 mg/dL Final         Passed - K in normal range and within 180 days    Potassium  Date  Value Ref Range Status  10/22/2023 4.2 3.5 - 5.3 mmol/L Final  01/30/2012 3.6 3.5 - 5.1 mmol/L Final         Passed - Na in normal range and within 180 days    Sodium  Date Value Ref Range Status  10/22/2023 136 135 - 146 mmol/L Final  01/30/2012 141 136 - 145 mmol/L Final         Passed - Last BP in normal range    BP Readings from Last 1 Encounters:  11/09/23 138/70

## 2024-04-18 NOTE — Telephone Encounter (Signed)
 Requested medications are due for refill today.  See pharmacy note  Requested medications are on the active medications list.  yes  Last refill. 07/30/2023 #90 3 rf  Future visit scheduled.   yes  Notes to clinic.  Pharmacy comment: Alternative Requested:MAX FILLS LIMIT REACHED PER PLAN. PLEASE SEND NEW SCRIPT.     Requested Prescriptions  Pending Prescriptions Disp Refills   lisinopril  (ZESTRIL ) 40 MG tablet [Pharmacy Med Name: LISINOPRIL  40 MG TABLET]  0     Cardiovascular:  ACE Inhibitors Failed - 04/18/2024  4:22 PM      Failed - Valid encounter within last 6 months    Recent Outpatient Visits   None            Passed - Cr in normal range and within 180 days    Creat  Date Value Ref Range Status  10/22/2023 1.04 0.70 - 1.28 mg/dL Final   Creatinine, Urine  Date Value Ref Range Status  10/22/2023 43 20 - 320 mg/dL Final         Passed - K in normal range and within 180 days    Potassium  Date Value Ref Range Status  10/22/2023 4.2 3.5 - 5.3 mmol/L Final  01/30/2012 3.6 3.5 - 5.1 mmol/L Final         Passed - Patient is not pregnant      Passed - Last BP in normal range    BP Readings from Last 1 Encounters:  11/09/23 138/70

## 2024-04-24 ENCOUNTER — Ambulatory Visit: Payer: Self-pay | Admitting: Family Medicine

## 2024-04-24 ENCOUNTER — Other Ambulatory Visit: Payer: Self-pay | Admitting: Family Medicine

## 2024-04-24 ENCOUNTER — Encounter: Payer: Self-pay | Admitting: Family Medicine

## 2024-04-24 VITALS — BP 130/68 | HR 57 | Ht 68.0 in | Wt 216.1 lb

## 2024-04-24 DIAGNOSIS — Z0000 Encounter for general adult medical examination without abnormal findings: Secondary | ICD-10-CM

## 2024-04-24 DIAGNOSIS — E1169 Type 2 diabetes mellitus with other specified complication: Secondary | ICD-10-CM

## 2024-04-24 DIAGNOSIS — E66811 Obesity, class 1: Secondary | ICD-10-CM

## 2024-04-24 DIAGNOSIS — I10 Essential (primary) hypertension: Secondary | ICD-10-CM | POA: Diagnosis not present

## 2024-04-24 DIAGNOSIS — Z7984 Long term (current) use of oral hypoglycemic drugs: Secondary | ICD-10-CM | POA: Diagnosis not present

## 2024-04-24 DIAGNOSIS — R351 Nocturia: Secondary | ICD-10-CM

## 2024-04-24 DIAGNOSIS — Z125 Encounter for screening for malignant neoplasm of prostate: Secondary | ICD-10-CM

## 2024-04-24 DIAGNOSIS — N400 Enlarged prostate without lower urinary tract symptoms: Secondary | ICD-10-CM

## 2024-04-24 DIAGNOSIS — E785 Hyperlipidemia, unspecified: Secondary | ICD-10-CM

## 2024-04-24 DIAGNOSIS — Z1329 Encounter for screening for other suspected endocrine disorder: Secondary | ICD-10-CM

## 2024-04-24 LAB — POCT GLYCOSYLATED HEMOGLOBIN (HGB A1C): Hemoglobin A1C: 7.5 % — AB (ref 4.0–5.6)

## 2024-04-24 MED ORDER — HYDROCHLOROTHIAZIDE 25 MG PO TABS: 25.0000 mg | ORAL_TABLET | Freq: Every day | ORAL | 1 refills | Status: DC

## 2024-04-24 MED ORDER — ATORVASTATIN CALCIUM 20 MG PO TABS: 20.0000 mg | ORAL_TABLET | Freq: Every day | ORAL | 1 refills | Status: DC

## 2024-04-24 MED ORDER — METOPROLOL SUCCINATE ER 50 MG PO TB24: 50.0000 mg | ORAL_TABLET | Freq: Every day | ORAL | 1 refills | Status: DC

## 2024-04-24 MED ORDER — AMLODIPINE BESYLATE 10 MG PO TABS: 10.0000 mg | ORAL_TABLET | Freq: Every day | ORAL | 1 refills | Status: DC

## 2024-04-24 NOTE — Patient Instructions (Addendum)
 Thank you for coming to the office today.  Recent Labs    10/22/23 0803 10/25/23 0850 04/24/24 0924  HGBA1C 8.4* 7.0* 7.5*   Keep on Metformin   Refilled medication.  DUE for FASTING BLOOD WORK (no food or drink after midnight before the lab appointment, only water or coffee without cream/sugar on the morning of)  SCHEDULE Lab Only visit in the morning at the clinic for lab draw in 6 MONTHS   - Make sure Lab Only appointment is at about 1 week before your next appointment, so that results will be available  For Lab Results, once available within 2-3 days of blood draw, you can can log in to MyChart online to view your results and a brief explanation. Also, we can discuss results at next follow-up visit.   Please schedule a Follow-up Appointment to: Return for 6 month fasting lab > 1 week later Annual Physical.  If you have any other questions or concerns, please feel free to call the office or send a message through MyChart. You may also schedule an earlier appointment if necessary.  Additionally, you may be receiving a survey about your experience at our office within a few days to 1 week by e-mail or mail. We value your feedback.  Domingo Friend, DO [facility], [place]

## 2024-04-24 NOTE — Progress Notes (Addendum)
 Subjective:    Patient ID: Brady Sanchez, male    DOB: 26-Sep-1946, 77 y.o.   MRN: [id number]  Brady Sanchez is a 78 y.o. male presenting on 04/24/2024 for Diabetes   HPI  Discussed the use of AI scribe software for clinical note transcription with the patient, who gave verbal consent to proceed.  History of Present Illness   Brady Sanchez is a 78 year old male with diabetes who presents for a routine follow-up.     CHRONIC DM, Type 2 / Obesity BMI >32 His most recent A1c is 7.5%, showing improvement from a year ago when it was 8.4%. He has been maintaining his A1c around the 7% range and reports no issues with his current regimen, feeling it is effective. No new concerns. Not checking sugar regularly. Not interested in glucometer, but he will notify us  CBGs: none recently Meds: Metformin  1000mg  BID Reports good compliance. Tolerating well w/o side-effects Currently on ACEi Lifestyle:  - Diet (low sugar low carb diet, trying to improve)  - Exercise (limited) Update eye exam Denies hypoglycemia, polyuria, visual changes, numbness or tingling.   CHRONIC HTN: No new concerns. Needs refills Current Meds - Amlodipine  10mg  daily, Lisinopril  40mg  daily, HCTZ 25mg  daily, Metoprolol  XL 50mg  daily Reports good compliance, took meds today. Tolerating well, w/o complaints. Denies CP, dyspnea, edema, dizziness / lightheadedness, HA   HYPERLIPIDEMIA: - Reports no concerns. Last lipid panel 10/2023 elevated LDL 105 mild range - Currently taking Atorvastatin  20mg , tolerating well without side effects or myalgias Due for refill   Health Maintenance: Decline pneumonia vaccine.     10/25/2023    8:43 AM 08/03/2023    2:46 PM 07/28/2022   11:39 AM  Depression screen PHQ 2/9  Decreased Interest 0 0 0  Down, Depressed, Hopeless 0 0 0  PHQ - 2 Score 0 0 0  Altered sleeping  0 0  Tired, decreased energy  0 0  Change in appetite  0 0  Feeling bad or failure about yourself    0 0  Trouble concentrating  0 0  Moving slowly or fidgety/restless  0 0  Suicidal thoughts  0 0  PHQ-9 Score  0 0  Difficult doing work/chores  Not difficult at all Not difficult at all       10/25/2023    8:44 AM 09/13/2021    1:22 PM 12/28/2017    2:30 PM  GAD 7 : Generalized Anxiety Score  Nervous, Anxious, on Edge 0 0 0  Control/stop worrying 0 0 0  Worry too much - different things 0 0 0  Trouble relaxing 0 0 0  Restless 0 0 0  Easily annoyed or irritable 0 0 0  Afraid - awful might happen 0 0 0  Total GAD 7 Score 0 0 0  Anxiety Difficulty  Not difficult at all Not difficult at all    Social History   Tobacco Use   Smoking status: Never   Smokeless tobacco: Never  Vaping Use   Vaping status: Never Used  Substance Use Topics   Alcohol use: Yes   Drug use: No    Review of Systems Per HPI unless specifically indicated above     Objective:    BP 130/68 (BP Location: Right Arm, Patient Position: Sitting, Cuff Size: Normal)   Pulse (!) 57   Ht 5' 8 (1.727 m)   Wt 216 lb 2 oz (98 kg)   SpO2 97%   BMI 32.86 kg/m  Wt Readings from Last 3 Encounters:  04/24/24 216 lb 2 oz (98 kg)  11/09/23 219 lb (99.3 kg)  10/25/23 219 lb (99.3 kg)    Physical Exam Vitals and nursing note reviewed.  Constitutional:      General: He is not in acute distress.    Appearance: He is well-developed. He is not diaphoretic.     Comments: Well-appearing, comfortable, cooperative  HENT:     Head: Normocephalic and atraumatic.   Eyes:     General:        Right eye: No discharge.        Left eye: No discharge.     Conjunctiva/sclera: Conjunctivae normal.   Neck:     Thyroid: No thyromegaly.   Cardiovascular:     Rate and Rhythm: Normal rate and regular rhythm.     Pulses: Normal pulses.     Heart sounds: Normal heart sounds. No murmur heard. Pulmonary:     Effort: Pulmonary effort is normal. No respiratory distress.     Breath sounds: Normal breath sounds. No wheezing  or rales.   Musculoskeletal:        General: Normal range of motion.     Cervical back: Normal range of motion and neck supple.  Lymphadenopathy:     Cervical: No cervical adenopathy.   Skin:    General: Skin is warm and dry.     Findings: No erythema or rash.   Neurological:     Mental Status: He is alert and oriented to person, place, and time. Mental status is at baseline.   Psychiatric:        Behavior: Behavior normal.     Comments: Well groomed, good eye contact, normal speech and thoughts     Results for orders placed or performed in visit on 04/24/24  POCT HgB A1C   Collection Time: 04/24/24  9:24 AM  Result Value Ref Range   Hemoglobin A1C 7.5 (A) 4.0 - 5.6 %   HbA1c POC (<> result, manual entry)     HbA1c, POC (prediabetic range)     HbA1c, POC (controlled diabetic range)        Assessment & Plan:   Problem List Items Addressed This Visit     Essential hypertension   Relevant Medications   atorvastatin  (LIPITOR) 20 MG tablet   amLODipine  (NORVASC ) 10 MG tablet   hydrochlorothiazide  (HYDRODIURIL ) 25 MG tablet   metoprolol  succinate (TOPROL -XL) 50 MG 24 hr tablet   Hyperlipidemia associated with type 2 diabetes mellitus (HCC)   Relevant Medications   atorvastatin  (LIPITOR) 20 MG tablet   amLODipine  (NORVASC ) 10 MG tablet   hydrochlorothiazide  (HYDRODIURIL ) 25 MG tablet   metoprolol  succinate (TOPROL -XL) 50 MG 24 hr tablet   Obesity (BMI 30.0-34.9)   Type 2 diabetes mellitus with other specified complication (HCC) - Primary   Relevant Medications   atorvastatin  (LIPITOR) 20 MG tablet   Other Relevant Orders   POCT HgB A1C (Completed)   Other Visit Diagnoses       Long term current use of oral hypoglycemic drug            Type 2 Diabetes Mellitus HbA1c slight increase but stable 7.5% on metformin  1000 mg twice daily. No hypoglycemia risk. Declined newer medications and continuous glucose monitoring due to cost and preference. - Continue metformin   1000 mg twice daily. - Encourage occasional blood glucose monitoring but he would need to notify us  if ready for glucometer / which version or OTC option -  Reassess medication if HbA1c exceeds 8%.  Hypertension Blood pressure controlled at 130/68 mmHg. Continue current therapy - Amlodipine  10mg  daily, hydrochlorothiazide  25mg  daily, Lisinopril  40mg  daily, Metoprolol  XL 50mg  daily  Hyperlipidemia Controlled on Atorvastatin . Refill today  Obesity BMI >32  General Health Maintenance Up to date on urine testing, foot checks, and eye exams. Declined pneumonia vaccination and heart/lung CT scan due to cost and current health status. - Offer heart and lung CT scan at next visit if interested.        Orders Placed This Encounter  Procedures   POCT HgB A1C    Meds ordered this encounter  Medications   atorvastatin  (LIPITOR) 20 MG tablet    Sig: Take 1 tablet (20 mg total) by mouth daily.    Dispense:  90 tablet    Refill:  1   amLODipine  (NORVASC ) 10 MG tablet    Sig: Take 1 tablet (10 mg total) by mouth daily.    Dispense:  90 tablet    Refill:  1   hydrochlorothiazide  (HYDRODIURIL ) 25 MG tablet    Sig: Take 1 tablet (25 mg total) by mouth daily.    Dispense:  90 tablet    Refill:  1   metoprolol  succinate (TOPROL -XL) 50 MG 24 hr tablet    Sig: Take 1 tablet (50 mg total) by mouth daily.    Dispense:  90 tablet    Refill:  1    Follow up plan: Return for 6 month fasting lab > 1 week later Annual Physical.  Future labs ordered for 10/27/24   Domingo Friend, DO [facility], [facility] 04/24/2024, 9:31 AM

## 2024-05-10 ENCOUNTER — Other Ambulatory Visit: Payer: Self-pay | Admitting: Family Medicine

## 2024-05-10 DIAGNOSIS — E1169 Type 2 diabetes mellitus with other specified complication: Secondary | ICD-10-CM

## 2024-05-13 NOTE — Telephone Encounter (Signed)
 Requested medication (s) are due for refill today: yes  Requested medication (s) are on the active medication list: hx med  Last refill:  04/24/2024   Future visit scheduled: yes  Notes to clinic:  historical provider   Requested Prescriptions  Pending Prescriptions Disp Refills   Accu-Chek Softclix Lancets lancets [Pharmacy Med Name: ACCU-CHEK SOFTCLIX LANCETS] 100 each 3    Sig: USE TO CHECK BLOOD SUGAR UP TO TWICE DAILY. (MAY SUBSTITUTE TO INS COVERAGE)     Endocrinology: Diabetes - Testing Supplies Failed - 05/13/2024  7:55 AM      Failed - Valid encounter within last 12 months    Recent Outpatient Visits           2 weeks ago Type 2 diabetes mellitus with other specified complication, without long-term current use of insulin  [facility])   [facility] [place], Marsa PARAS, [place]

## 2024-07-31 ENCOUNTER — Other Ambulatory Visit: Payer: Self-pay | Admitting: Family Medicine

## 2024-07-31 DIAGNOSIS — L309 Dermatitis, unspecified: Secondary | ICD-10-CM

## 2024-08-01 NOTE — Telephone Encounter (Signed)
 Requested medications are due for refill today.  yes  Requested medications are on the active medications list.  yes  Last refill. 15g 3 rf  Future visit scheduled.   yes  Notes to clinic.  Refill not delegated.    Requested Prescriptions  Pending Prescriptions Disp Refills   triamcinolone  cream (KENALOG ) 0.5 % [Pharmacy Med Name: TRIAMCINOLONE  0.5% CREAM] 15 g 3    Sig: APPLY TO AFFECTED AREA EVERY DAY AS NEEDED     Not Delegated - Dermatology:  Corticosteroids Failed - 08/01/2024 11:57 AM      Failed - This refill cannot be delegated      Failed - Valid encounter within last 12 months    Recent Outpatient Visits           3 months ago Type 2 diabetes mellitus with other specified complication, without long-term current use of insulin  [facility])    [facility] [place], Marsa PARAS, [place]

## 2024-08-08 ENCOUNTER — Ambulatory Visit: Payer: Self-pay

## 2024-08-08 DIAGNOSIS — Z0000 Encounter for general adult medical examination without abnormal findings: Secondary | ICD-10-CM | POA: Diagnosis not present

## 2024-08-08 NOTE — Patient Instructions (Addendum)
 [name],  Thank you for taking the time for your Medicare Wellness Visit. I appreciate your continued commitment to your health goals. Please review the care plan we discussed, and feel free to reach out if I can assist you further.  Medicare recommends these wellness visits once per year to help you and your care team stay ahead of potential health issues. These visits are designed to focus on prevention, allowing your provider to concentrate on managing your acute and chronic conditions during your regular appointments.  Please note that Annual Wellness Visits do not include a physical exam. Some assessments may be limited, especially if the visit was conducted virtually. If needed, we may recommend a separate in-person follow-up with your provider.  Ongoing Care Seeing your primary care provider every 3 to 6 months helps us  monitor your health and provide consistent, personalized care.   Referrals If a referral was made during today's visit and you haven't received any updates within two weeks, please contact the referred provider directly to check on the status.  Recommended Screenings:  Health Maintenance  Topic Date Due   DTaP/Tdap/Td vaccine (1 - Tdap) Never done   Zoster (Shingles) Vaccine (1 of 2) Never done   Flu Shot  06/13/2024   COVID-19 Vaccine (3 - 2025-26 season) 07/14/2024   Pneumococcal Vaccine for age over 53 (1 of 2 - PCV) 10/24/2024*   Yearly kidney function blood test for diabetes  10/21/2024   Yearly kidney health urinalysis for diabetes  10/21/2024   Complete foot exam   10/24/2024   Hemoglobin A1C  10/24/2024   Eye exam for diabetics  10/31/2024   Medicare Annual Wellness Visit  08/08/2025   Hepatitis C Screening  Completed   HPV Vaccine  Aged Out   Meningitis B Vaccine  Aged Out   Cologuard (Stool DNA test)  Discontinued  *Topic was postponed. The date shown is not the original due date.     Advance Care Planning is important because it: Ensures you  receive medical care that aligns with your values, goals, and preferences. Provides guidance to your family and loved ones, reducing the emotional burden of decision-making during critical moments.  Vision: Annual vision screenings are recommended for early detection of glaucoma, cataracts, and diabetic retinopathy. These exams can also reveal signs of chronic conditions such as diabetes and high blood pressure.  Dental: Annual dental screenings help detect early signs of oral cancer, gum disease, and other conditions linked to overall health, including heart disease and diabetes.  Please see the attached documents for additional preventive care recommendations.   NEXT AWV 08/21/25 @ 2:40 PM BY PHONE

## 2024-08-08 NOTE — Progress Notes (Signed)
 Subjective:   Brady Sanchez is a 78 y.o. who presents for a Medicare Wellness preventive visit.  As a reminder, Annual Wellness Visits don't include a physical exam, and some assessments may be limited, especially if this visit is performed virtually. We may recommend an in-person follow-up visit with your provider if needed.  Visit Complete: Virtual I connected with  [name] on 08/08/24 by a audio enabled telemedicine application and verified that I am speaking with the correct person using two identifiers.  Patient Location: Home  Provider Location: Home Office  I discussed the limitations of evaluation and management by telemedicine. The patient expressed understanding and agreed to proceed.  Vital Signs: Because this visit was a virtual/telehealth visit, some criteria may be missing or patient reported. Any vitals not documented were not able to be obtained and vitals that have been documented are patient reported.  VideoDeclined- This patient declined Librarian, academic. Therefore the visit was completed with audio only.  Persons Participating in Visit: Patient.  AWV Questionnaire: No: Patient Medicare AWV questionnaire was not completed prior to this visit.  Cardiac Risk Factors include: advanced age (>9men, >9 women);diabetes mellitus;dyslipidemia;hypertension;male gender;obesity (BMI >30kg/m2)     Objective:    There were no vitals filed for this visit. There is no height or weight on file to calculate BMI.     08/08/2024   12:51 PM 08/03/2023    2:47 PM 07/28/2022   11:41 AM 07/26/2021    1:26 PM 08/06/2019    1:27 AM 11/13/2018    5:41 PM 11/13/2018   10:56 AM  Advanced Directives  Does Patient Have a Medical Advance Directive? No No No No No No  No   Would patient like information on creating a medical advance directive? No - Patient declined No - Patient declined No - Patient declined  No - Patient declined No - Patient  declined       Data saved with a previous flowsheet row definition    Current Medications (verified) Outpatient Encounter Medications as of 08/08/2024  Medication Sig   Accu-Chek Softclix Lancets lancets USE TO CHECK BLOOD SUGAR UP TO TWICE DAILY. (MAY SUBSTITUTE TO INS COVERAGE)   amLODipine  (NORVASC ) 10 MG tablet Take 1 tablet (10 mg total) by mouth daily.   atorvastatin  (LIPITOR) 20 MG tablet Take 1 tablet (20 mg total) by mouth daily.   Blood Glucose Monitoring Suppl (ACCU-CHEK GUIDE) w/Device KIT PLEASE SEE ATTACHED FOR DETAILED DIRECTIONS   Blood Glucose Monitoring Suppl DEVI Use to check blood sugar up to twice daily. May substitute to any manufacturer covered by patient's insurance.   Glucose Blood (BLOOD GLUCOSE TEST STRIPS) STRP Use to check blood sugar up to twice daily. May substitute to any manufacturer covered by patient's insurance.   hydrochlorothiazide  (HYDRODIURIL ) 25 MG tablet Take 1 tablet (25 mg total) by mouth daily.   Lancets Misc. MISC Use to check blood sugar up to twice daily. May substitute to any manufacturer covered by patient's insurance.   lisinopril  (ZESTRIL ) 40 MG tablet Take 1 tablet (40 mg total) by mouth daily.   metFORMIN  (GLUCOPHAGE ) 1000 MG tablet Take 1 tablet (1,000 mg total) by mouth 2 (two) times daily with a meal.   metoprolol  succinate (TOPROL -XL) 50 MG 24 hr tablet Take 1 tablet (50 mg total) by mouth daily.   triamcinolone  cream (KENALOG ) 0.5 % APPLY TO AFFECTED AREA EVERY DAY AS NEEDED   No facility-administered encounter medications on file as of 08/08/2024.  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  Tobacco Use   Smoking status: Never   Smokeless tobacco: Never  Vaping Use   Vaping status: Never Used  Substance and Sexual Activity   Alcohol use: Yes   Drug use: No   Sexual activity: Not on file  Other Topics Concern   Not on file  Social History Narrative   Not on file   Social Drivers of Health   Financial Resource Strain: Low Risk  (08/08/2024)   Overall Financial Resource Strain (CARDIA)    Difficulty of Paying Living Expenses: Not hard at all  Food Insecurity: No Food Insecurity (08/08/2024)   Hunger Vital Sign    Worried About Running Out of Food in the Last Year: Never true    Ran Out of Food in the Last Year: Never true  Transportation Needs: No Transportation Needs (08/08/2024)   PRAPARE - Administrator, Civil Service (Medical): No    Lack of Transportation (Non-Medical): No  Physical Activity: Sufficiently Active (08/08/2024)   Exercise Vital Sign    Days of Exercise per Week: 5 days    Minutes of Exercise per Session: 60 min  Stress: No Stress Concern Present (08/08/2024)   [employer] of Occupational Health - Occupational Stress Questionnaire    Feeling of Stress: Not at all  Social Connections: Socially Isolated (08/08/2024)   Social Connection and Isolation Panel    Frequency of Communication with Friends and Family: More than three times a week    Frequency of Social Gatherings with Friends and Family: Twice a week    Attends Religious Services: Never    Database administrator or Organizations: No    Attends Banker Meetings: Never    Marital Status: Widowed    Tobacco Counseling Counseling given: Not Answered    Clinical Intake:  Pre-visit preparation completed: Yes  Pain :  No/denies pain     BMI - recorded: 32.8 Nutritional Status: BMI > 30  Obese Nutritional Risks: None Diabetes: Yes CBG done?: No Did pt. bring in CBG monitor from home?: No  Lab Results  Component Value Date   HGBA1C 7.5 (A) 04/24/2024   HGBA1C 7.0 (A) 10/25/2023   HGBA1C 8.4 (H) 10/22/2023     How often do you need to have someone help you when you read instructions, pamphlets, or other written materials from your doctor or pharmacy?: 1 - Never  Interpreter Needed?: No  Information entered by :: JHONNIE DAS, LPN   Activities of Daily Living     08/08/2024   12:53 PM  In your present state of health, do you have any difficulty performing the following activities:  Hearing? 0  Vision? 0  Difficulty concentrating or making decisions? 0  Walking or climbing stairs? 0  Dressing or bathing? 0  Doing errands, shopping? 0  Preparing Food and eating ? N  Using the Toilet? N  In the past six months, have you accidently leaked urine? N  Do you have problems with loss of bowel control? N  Managing your Medications? N  Managing your Finances? N  Housekeeping or managing your Housekeeping? N    Patient Care Team: Edman Marsa PARAS, DO as PCP - General (Family Medicine) Alana Sharyle LABOR, RPH-CPP as Pharmacist [place], Francis NOVAK, OD (Optometry)  I have updated your Care Teams any recent Medical Services you may have received from other providers in the past year.     Assessment:   This is a routine wellness examination for [place].  Hearing/Vision screen Hearing Screening - Comments:: NO AIDS Vision Screening - Comments:: READERS- [facility]- NO DIABETIC RETINOPATHY   Goals Addressed             This Visit's Progress    Cut out extra servings         Depression Screen     08/08/2024   12:50 PM 10/25/2023    8:43 AM 08/03/2023    2:46 PM 07/28/2022   11:39 AM 09/13/2021    1:21 PM 07/26/2021    1:27 PM 09/21/2020    8:08 AM  PHQ 2/9 Scores  PHQ - 2  Score 0 0 0 0 0 0 0  PHQ- 9 Score 0  0 0 0      Fall Risk     08/08/2024   12:53 PM 10/25/2023    8:43 AM 08/03/2023    2:48 PM 07/28/2022   11:42 AM 09/13/2021    1:21 PM  Fall Risk   Falls in the past year? 0 0 0 0 0  Number falls in past yr: 0  0 0 0  Injury with Fall? 0  0 0 0  Risk for fall due to : No Fall Risks  No Fall Risks No Fall Risks   Follow up Falls evaluation completed;Falls prevention discussed  Falls prevention discussed;Falls evaluation completed Falls prevention discussed  Falls evaluation completed      Data saved with a previous flowsheet row definition    MEDICARE RISK AT HOME:  Medicare Risk at Home Any stairs in or around the home?: Yes If so, are there any without handrails?: Yes Home free of loose throw rugs in walkways, pet beds, electrical cords, etc?: Yes Adequate lighting in your home to reduce risk of falls?: Yes Life alert?: No Use of a cane, walker or w/c?: No Grab bars in the bathroom?: No Shower chair or bench in shower?: No Elevated toilet seat or a handicapped toilet?: No  TIMED UP AND GO:  Was the test performed?  No  Cognitive Function: 6CIT completed        08/08/2024   12:55 PM 08/03/2023    2:50 PM 07/26/2021    1:28 PM  6CIT Screen  What Year? 0 points 0 points 0 points  What month? 0 points 0 points 0 points  What time? 0 points 0 points 0 points  Count back from 20 0 points 0 points 0 points  Months in reverse 4 points 0 points 4 points  Repeat phrase 2 points 4 points 6 points  Total Score 6 points 4 points 10 points    Immunizations Immunization History  Administered Date(s) Administered   Fluad Quad(high Dose 65+) 08/19/2019   Moderna Sars-Covid-2 Vaccination 12/22/2019, 01/19/2020    Screening  Tests Health Maintenance  Topic Date Due   DTaP/Tdap/Td (1 - Tdap) Never done   Zoster Vaccines- Shingrix (1 of 2) Never done   Influenza Vaccine  06/13/2024   COVID-19 Vaccine (3 - 2025-26 season) 07/14/2024    Pneumococcal Vaccine: 50+ Years (1 of 2 - PCV) 10/24/2024 (Originally 08/11/1965)   Diabetic kidney evaluation - eGFR measurement  10/21/2024   Diabetic kidney evaluation - Urine ACR  10/21/2024   FOOT EXAM  10/24/2024   HEMOGLOBIN A1C  10/24/2024   OPHTHALMOLOGY EXAM  10/31/2024   Medicare Annual Wellness (AWV)  08/08/2025   Hepatitis C Screening  Completed   HPV VACCINES  Aged Out   Meningococcal B Vaccine  Aged Out   Fecal DNA (Cologuard)  Discontinued    Health Maintenance Items Addressed: DECLINES VACCINES;AGED OUT OF COLONOSCOPY   Additional Screening:  Vision Screening: Recommended annual ophthalmology exams for early detection of glaucoma and other disorders of the eye. Is the patient up to date with their annual eye exam?  Yes  Who is the provider or what is the name of the office in which the patient attends annual eye exams? [facility]  Dental Screening: Recommended annual dental exams for proper oral hygiene  Community Resource Referral / Chronic Care Management: CRR required this visit?  No   CCM required this visit?  No   Plan:    I have personally reviewed and noted the following in the patient's chart:   Medical and social history Use of alcohol, tobacco or illicit drugs  Current medications and supplements including opioid prescriptions. Patient is not currently taking opioid prescriptions. Functional ability and status Nutritional status Physical activity Advanced directives List of other physicians Hospitalizations, surgeries, and ER visits in previous 12 months Vitals Screenings to include cognitive, depression, and falls Referrals and appointments  In addition, I have reviewed and discussed with patient certain preventive protocols, quality metrics, and best practice recommendations. A written personalized care plan for preventive services as well as general preventive health recommendations were provided to patient.   Jhonnie GORMAN Das,  LPN   [id number]   After Visit Summary: (MyChart) Due to this being a telephonic visit, the after visit summary with patients personalized plan was offered to patient via MyChart   Notes: Nothing significant to report at this time.

## 2024-09-11 ENCOUNTER — Other Ambulatory Visit: Payer: Self-pay | Admitting: Family Medicine

## 2024-09-11 DIAGNOSIS — E1169 Type 2 diabetes mellitus with other specified complication: Secondary | ICD-10-CM

## 2024-09-12 NOTE — Telephone Encounter (Signed)
 Requested Prescriptions  Pending Prescriptions Disp Refills   Accu-Chek Softclix Lancets lancets [Pharmacy Med Name: ACCU-CHEK SOFTCLIX LANCETS] 200 each 1    Sig: USE TO CHECK BLOOD SUGAR UP TO TWICE DAILY. (MAY SUBSTITUTE TO INS COVERAGE)     Endocrinology: Diabetes - Testing Supplies Passed - 09/12/2024  5:26 PM      Passed - Valid encounter within last 12 months    Recent Outpatient Visits           4 months ago Type 2 diabetes mellitus with other specified complication, without long-term current use of insulin  [facility])   [facility] [place], [place]a PARAS, [place]

## 2024-10-02 ENCOUNTER — Ambulatory Visit

## 2024-10-13 ENCOUNTER — Other Ambulatory Visit: Payer: Self-pay | Admitting: Family Medicine

## 2024-10-13 DIAGNOSIS — E1169 Type 2 diabetes mellitus with other specified complication: Secondary | ICD-10-CM

## 2024-10-16 NOTE — Telephone Encounter (Signed)
 Requested Prescriptions  Pending Prescriptions Disp Refills   atorvastatin  (LIPITOR) 20 MG tablet [Pharmacy Med Name: ATORVASTATIN  20 MG TABLET] 30 tablet 0    Sig: TAKE 1 TABLET BY MOUTH EVERY DAY     Cardiovascular:  Antilipid - Statins Failed - 10/16/2024 10:24 AM      Failed - Lipid Panel in normal range within the last 12 months    Cholesterol  Date Value Ref Range Status  10/22/2023 173 <200 mg/dL Final   LDL Cholesterol (Calc)  Date Value Ref Range Status  10/22/2023 105 (H) mg/dL (calc) Final    Comment:    Reference range: <100 . Desirable range <100 mg/dL for primary prevention;   <70 mg/dL for patients with CHD or diabetic patients  with > or = 2 CHD risk factors. SABRA LDL-C is now calculated using the Martin-Hopkins  calculation, which is a validated novel method providing  better accuracy than the Friedewald equation in the  estimation of LDL-C.  Gladis APPLETHWAITE et al. SANDREA. 7986;689(80): 2061-2068  (http://education.[facility].com/faq/FAQ164)    HDL  Date Value Ref Range Status  10/22/2023 47 > OR = 40 mg/dL Final   Triglycerides  Date Value Ref Range Status  10/22/2023 118 <150 mg/dL Final         Passed - Patient is not pregnant      Passed - Valid encounter within last 12 months    Recent Outpatient Visits           5 months ago Type 2 diabetes mellitus with other specified complication, without long-term current use of insulin  [facility])   [facility] [place], [place], [place]

## 2024-10-27 ENCOUNTER — Other Ambulatory Visit

## 2024-10-28 ENCOUNTER — Other Ambulatory Visit

## 2024-10-29 LAB — HEMOGLOBIN A1C
Hgb A1c MFr Bld: 8.3 % — ABNORMAL HIGH (ref <5.7)
Mean Plasma Glucose: 192 mg/dL
eAG (mmol/L): 10.6 mmol/L

## 2024-10-29 LAB — CBC WITH DIFFERENTIAL/PLATELET
Absolute Lymphocytes: 1229 {cells}/uL (ref 850–3900)
Absolute Monocytes: 877 {cells}/uL (ref 200–950)
Basophils Absolute: 32 {cells}/uL (ref 0–200)
Basophils Relative: 0.5 %
Eosinophils Absolute: 179 {cells}/uL (ref 15–500)
Eosinophils Relative: 2.8 %
HCT: 43.6 % (ref 39.4–51.1)
Hemoglobin: 14.1 g/dL (ref 13.2–17.1)
MCH: 26 pg — ABNORMAL LOW (ref 27.0–33.0)
MCHC: 32.3 g/dL (ref 31.6–35.4)
MCV: 80.4 fL — ABNORMAL LOW (ref 81.4–101.7)
MPV: 11.4 fL (ref 7.5–12.5)
Monocytes Relative: 13.7 %
Neutro Abs: 4083 {cells}/uL (ref 1500–7800)
Neutrophils Relative %: 63.8 %
Platelets: 277 Thousand/uL (ref 140–400)
RBC: 5.42 Million/uL (ref 4.20–5.80)
RDW: 13.1 % (ref 11.0–15.0)
Total Lymphocyte: 19.2 %
WBC: 6.4 Thousand/uL (ref 3.8–10.8)

## 2024-10-29 LAB — MICROALBUMIN / CREATININE URINE RATIO
Creatinine, Urine: 32 mg/dL (ref 20–320)
Microalb Creat Ratio: 163 mg/g{creat} — ABNORMAL HIGH (ref <30)
Microalb, Ur: 5.2 mg/dL

## 2024-10-29 LAB — TSH: TSH: 2.81 m[IU]/L (ref 0.40–4.50)

## 2024-10-29 LAB — LIPID PANEL
Cholesterol: 138 mg/dL (ref <200)
HDL: 45 mg/dL (ref >=40)
LDL Cholesterol (Calc): 74 mg/dL
Non-HDL Cholesterol (Calc): 93 mg/dL (ref <130)
Total CHOL/HDL Ratio: 3.1 (calc) (ref <5.0)
Triglycerides: 104 mg/dL (ref <150)

## 2024-10-29 LAB — COMPREHENSIVE METABOLIC PANEL WITH GFR
AG Ratio: 1.4 (calc) (ref 1.0–2.5)
ALT: 25 U/L (ref 9–46)
AST: 22 U/L (ref 10–35)
Albumin: 4.4 g/dL (ref 3.6–5.1)
Alkaline phosphatase (APISO): 64 U/L (ref 35–144)
BUN: 12 mg/dL (ref 7–25)
CO2: 31 mmol/L (ref 20–32)
Calcium: 9.6 mg/dL (ref 8.6–10.3)
Chloride: 96 mmol/L — ABNORMAL LOW (ref 98–110)
Creat: 0.92 mg/dL (ref 0.70–1.28)
Globulin: 3.2 g/dL (ref 1.9–3.7)
Glucose, Bld: 128 mg/dL — ABNORMAL HIGH (ref 65–99)
Potassium: 4 mmol/L (ref 3.5–5.3)
Sodium: 134 mmol/L — ABNORMAL LOW (ref 135–146)
Total Bilirubin: 0.7 mg/dL (ref 0.2–1.2)
Total Protein: 7.6 g/dL (ref 6.1–8.1)
eGFR: 85 mL/min/1.73m2 (ref >=60)

## 2024-10-29 LAB — PSA: PSA: 2.86 ng/mL (ref <=4.00)

## 2024-10-31 LAB — OPHTHALMOLOGY REPORT-SCANNED

## 2024-11-05 ENCOUNTER — Other Ambulatory Visit: Payer: Self-pay | Admitting: Family Medicine

## 2024-11-05 DIAGNOSIS — I10 Essential (primary) hypertension: Secondary | ICD-10-CM

## 2024-11-07 ENCOUNTER — Ambulatory Visit (INDEPENDENT_AMBULATORY_CARE_PROVIDER_SITE_OTHER): Admitting: Family Medicine

## 2024-11-07 ENCOUNTER — Encounter: Payer: Self-pay | Admitting: Family Medicine

## 2024-11-07 VITALS — BP 122/80 | HR 57 | Ht 68.0 in | Wt 217.2 lb

## 2024-11-07 DIAGNOSIS — Z7984 Long term (current) use of oral hypoglycemic drugs: Secondary | ICD-10-CM | POA: Diagnosis not present

## 2024-11-07 DIAGNOSIS — E1169 Type 2 diabetes mellitus with other specified complication: Secondary | ICD-10-CM

## 2024-11-07 DIAGNOSIS — N400 Enlarged prostate without lower urinary tract symptoms: Secondary | ICD-10-CM

## 2024-11-07 DIAGNOSIS — E785 Hyperlipidemia, unspecified: Secondary | ICD-10-CM

## 2024-11-07 DIAGNOSIS — I10 Essential (primary) hypertension: Secondary | ICD-10-CM

## 2024-11-07 DIAGNOSIS — L309 Dermatitis, unspecified: Secondary | ICD-10-CM

## 2024-11-07 DIAGNOSIS — M150 Primary generalized (osteo)arthritis: Secondary | ICD-10-CM

## 2024-11-07 MED ORDER — ATORVASTATIN CALCIUM 20 MG PO TABS: 20.0000 mg | ORAL_TABLET | Freq: Every day | ORAL | 1 refills | Status: AC

## 2024-11-07 MED ORDER — METOPROLOL SUCCINATE ER 50 MG PO TB24: 50.0000 mg | ORAL_TABLET | Freq: Every day | ORAL | 1 refills | Status: AC

## 2024-11-07 MED ORDER — HYDROCHLOROTHIAZIDE 25 MG PO TABS: 25.0000 mg | ORAL_TABLET | Freq: Every day | ORAL | 1 refills | Status: DC

## 2024-11-07 NOTE — Patient Instructions (Addendum)
 Thank you for coming to the office today.  Recent Labs    04/24/24 0924 10/28/24 0804  HGBA1C 7.5* 8.3*   BP controlled  Meds refilled  Cholesterol looks great!  Please schedule a Follow-up Appointment to: Return for 6 month DM A1c, HTN.  If you have any other questions or concerns, please feel free to call the office or send a message through MyChart. You may also schedule an earlier appointment if necessary.  Additionally, you may be receiving a survey about your experience at our office within a few days to 1 week by e-mail or mail. We value your feedback.  Marsa Officer, DO [facility], [place]

## 2024-11-07 NOTE — Progress Notes (Signed)
 "  Subjective:    Patient ID: Brady Sanchez, male    DOB: 10/19/1946, 78 y.o.   MRN: [id number]  Brady Sanchez is a 78 y.o. male presenting on 11/07/2024 for Annual Exam   HPI  Discussed the use of AI scribe software for clinical note transcription with the patient, who gave verbal consent to proceed.  History of Present Illness   Brady Sanchez is a 78 year old male who presents for a Medicare annual physical exam.  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 Tolerating well w/o side-effects Currently on ACEi Lifestyle:  - Diet (low sugar low carb diet, trying to improve)  - Exercise (limited) Update eye exam request copy Denies hypoglycemia, polyuria, visual changes, numbness or tingling.   CHRONIC HTN: Controlled Current Meds - Amlodipine  10mg  daily, Lisinopril  40mg  daily, HCTZ 25mg  daily, Metoprolol  XL 50mg  daily Reports good compliance, took meds today. Tolerating well, w/o complaints. Denies CP, dyspnea, edema, dizziness / lightheadedness, HA   HYPERLIPIDEMIA: - Reports no concerns. Last lipid panel 10/2024 improved LDL 74 - Currently taking Atorvastatin  20mg , tolerating well without side effects or myalgias Due for refill   Eczema vs Chronic lower leg dermatitis - Persistent dry, flaky skin on lower legs - Recurring and pruritic - Uses prescription triamcinolone  cream, which alleviates itching - Dermatitis recurs every few weeks   Health  Maintenance: Decline pneumonia vaccine.  PSA 2.86, negative     11/07/2024    8:33 AM 08/08/2024   12:50 PM 10/25/2023    8:43 AM  Depression screen PHQ 2/9  Decreased Interest 0 0 0  Down, Depressed, Hopeless 0 0 0  PHQ - 2 Score 0 0 0  Altered sleeping  0   Tired, decreased energy  0   Change in appetite  0   Feeling bad or failure about yourself   0   Trouble concentrating  0   Moving slowly or fidgety/restless  0   Suicidal thoughts  0   PHQ-9 Score  0    Difficult doing work/chores  Not difficult at all      Data saved with a previous flowsheet row definition       11/07/2024    8:33 AM 10/25/2023    8:44 AM 09/13/2021    1:22 PM 12/28/2017    2:30 PM  GAD 7 : Generalized Anxiety Score  Nervous, Anxious, on Edge 0 0 0 0  Control/stop worrying 0 0 0 0  Worry too much - different things 0 0 0 0  Trouble relaxing 0 0 0 0  Restless 0 0 0 0  Easily annoyed or irritable 0 0 0 0  Afraid - awful might happen 0 0 0 0  Total GAD 7 Score 0 0 0 0  Anxiety Difficulty   Not difficult at all Not difficult at all     Past Medical History:  Diagnosis Date   Hyperlipidemia    Hypertension    Past Surgical History:  Procedure  Laterality Date   CATARACT EXTRACTION     REPLACEMENT TOTAL KNEE     Social History   Socioeconomic History   Marital status: Widowed    Spouse name: Not on file   Number of children: Not on file   Years of education: Not on file   Highest education level: Not on file  Occupational History   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  Tobacco Use   Smoking status: Never   Smokeless tobacco: Never  Vaping Use   Vaping status: Never Used  Substance and Sexual Activity   Alcohol use: Yes   Drug use: No   Sexual activity: Not on file  Other Topics Concern   Not on file  Social History Narrative   Not on file   Social Drivers of Health   Tobacco Use: Low Risk (11/07/2024)   Patient History    Smoking Tobacco Use: Never     Smokeless Tobacco Use: Never    Passive Exposure: Not on file  Financial Resource Strain: Low Risk (08/08/2024)   Overall Financial Resource Strain (CARDIA)    Difficulty of Paying Living Expenses: Not hard at all  Food Insecurity: No Food Insecurity (08/08/2024)   Epic    Worried About Radiation Protection Practitioner of Food in the Last Year: Never true    Ran Out of Food in the Last Year: Never true  Transportation Needs: No Transportation Needs (08/08/2024)   Epic    Lack of Transportation (Medical): No    Lack of Transportation (Non-Medical): No  Physical Activity: Sufficiently Active (08/08/2024)   Exercise Vital Sign    Days of Exercise per Week: 5 days    Minutes of Exercise per Session: 60 min  Stress: No Stress Concern Present (08/08/2024)   [employer] of Occupational Health - Occupational Stress Questionnaire    Feeling of Stress: Not at all  Social Connections: Socially Isolated (08/08/2024)   Social Connection and Isolation Panel    Frequency of Communication with Friends and Family: More than three times a week    Frequency of Social Gatherings with Friends and Family: Twice a week    Attends Religious Services: Never    Database Administrator or Organizations: No    Attends Banker Meetings: Never    Marital Status: Widowed  Intimate Partner Violence: Not At Risk (08/08/2024)   Epic    Fear of Current or Ex-Partner: No    Emotionally Abused: No    Physically Abused: No    Sexually Abused: No  Depression (PHQ2-9): Low Risk (11/07/2024)   Depression (PHQ2-9)    PHQ-2 Score: 0  Alcohol Screen: Low Risk (08/08/2024)   Alcohol Screen    Last Alcohol Screening Score (AUDIT): 1  Housing: Unknown (08/08/2024)   Epic    Unable to Pay for Housing in the Last Year: No    Number of Times Moved in the Last Year: Not on file    Homeless in the Last Year: No  Utilities: Not At Risk (08/08/2024)   Epic    Threatened with loss of utilities: No  Health Literacy: Adequate Health  Literacy (08/08/2024)   B1300 Health Literacy    Frequency of need for help with medical instructions: Never   Family History  Problem Relation Age of Onset   Seizures Mother    Cancer Father        lung   Current Outpatient Medications on File Prior to Visit  Medication Sig   Accu-Chek Softclix  Lancets lancets USE TO CHECK BLOOD SUGAR UP TO TWICE DAILY. (MAY SUBSTITUTE TO INS COVERAGE)   Blood Glucose Monitoring Suppl (ACCU-CHEK GUIDE) w/Device KIT PLEASE SEE ATTACHED FOR DETAILED DIRECTIONS   Blood Glucose Monitoring Suppl DEVI Use to check blood sugar up to twice daily. May substitute to any manufacturer covered by patient's insurance.   Glucose Blood (BLOOD GLUCOSE TEST STRIPS) STRP Use to check blood sugar up to twice daily. May substitute to any manufacturer covered by patient's insurance.   Lancets Misc. MISC Use to check blood sugar up to twice daily. May substitute to any manufacturer covered by patient's insurance.   lisinopril  (ZESTRIL ) 40 MG tablet Take 1 tablet (40 mg total) by mouth daily.   metFORMIN  (GLUCOPHAGE ) 1000 MG tablet Take 1 tablet (1,000 mg total) by mouth 2 (two) times daily with a meal.   triamcinolone  cream (KENALOG ) 0.5 % APPLY TO AFFECTED AREA EVERY DAY AS NEEDED   amLODipine  (NORVASC ) 10 MG tablet TAKE 1 TABLET BY MOUTH EVERY DAY   No current facility-administered medications on file prior to visit.    Review of Systems  Constitutional:  Negative for activity change, appetite change, chills, diaphoresis, fatigue and fever.  HENT:  Negative for congestion and hearing loss.   Eyes:  Negative for visual disturbance.  Respiratory:  Negative for cough, chest tightness, shortness of breath and wheezing.   Cardiovascular:  Negative for chest pain, palpitations and leg swelling.  Gastrointestinal:  Negative for abdominal pain, constipation, diarrhea, nausea and vomiting.  Genitourinary:  Negative for dysuria, frequency and hematuria.  Musculoskeletal:  Negative  for arthralgias and neck pain.  Skin:  Negative for rash.  Neurological:  Negative for dizziness, weakness, light-headedness, numbness and headaches.  Hematological:  Negative for adenopathy.  Psychiatric/Behavioral:  Negative for behavioral problems, dysphoric mood and sleep disturbance.    Per HPI unless specifically indicated above     Objective:    BP 122/80 (BP Location: Left Arm, Patient Position: Sitting, Cuff Size: Normal)   Pulse (!) 57   Ht 5' 8 (1.727 m)   Wt 217 lb 4 oz (98.5 kg)   SpO2 99%   BMI 33.03 kg/m   Wt Readings from Last 3 Encounters:  11/07/24 217 lb 4 oz (98.5 kg)  04/24/24 216 lb 2 oz (98 kg)  11/09/23 219 lb (99.3 kg)    Physical Exam Vitals and nursing note reviewed.  Constitutional:      General: He is not in acute distress.    Appearance: He is well-developed. He is not diaphoretic.     Comments: Well-appearing, comfortable, cooperative  HENT:     Head: Normocephalic and atraumatic.  Eyes:     General:        Right eye: No discharge.        Left eye: No discharge.     Conjunctiva/sclera: Conjunctivae normal.     Pupils: Pupils are equal, round, and reactive to light.  Neck:     Thyroid: No thyromegaly.     Vascular: No carotid bruit.  Cardiovascular:     Rate and Rhythm: Normal rate and regular rhythm.     Pulses: Normal pulses.     Heart sounds: Normal heart sounds. No murmur heard. Pulmonary:     Effort: Pulmonary effort is normal. No respiratory distress.     Breath sounds: Normal breath sounds. No wheezing or rales.  Abdominal:     General: Bowel sounds are normal. There is no distension.     Palpations:  Abdomen is soft. There is no mass.     Tenderness: There is no abdominal tenderness.  Musculoskeletal:        General: No tenderness. Normal range of motion.     Cervical back: Normal range of motion and neck supple.     Right lower leg: No edema.     Left lower leg: No edema.     Comments: Upper / Lower Extremities: - Normal  muscle tone, strength bilateral upper extremities 5/5, lower extremities 5/5  Lymphadenopathy:     Cervical: No cervical adenopathy.  Skin:    General: Skin is warm and dry.     Findings: No erythema or rash.  Neurological:     Mental Status: He is alert and oriented to person, place, and time.     Comments: Distal sensation intact to light touch all extremities  Psychiatric:        Mood and Affect: Mood normal.        Behavior: Behavior normal.        Thought Content: Thought content normal.     Comments: Well groomed, good eye contact, normal speech and thoughts       Results for orders placed or performed in visit on 10/31/24  OPHTHALMOLOGY REPORT-SCANNED   Collection Time: 10/31/24 12:24 PM  Result Value Ref Range   HM Diabetic Eye Exam No Retinopathy No Retinopathy   A Comment        Assessment & Plan:   Problem List Items Addressed This Visit     Essential hypertension   Relevant Medications   atorvastatin  (LIPITOR) 20 MG tablet   hydrochlorothiazide  (HYDRODIURIL ) 25 MG tablet   metoprolol  succinate (TOPROL -XL) 50 MG 24 hr tablet   Hyperlipidemia associated with type 2 diabetes mellitus (HCC)   Relevant Medications   atorvastatin  (LIPITOR) 20 MG tablet   hydrochlorothiazide  (HYDRODIURIL ) 25 MG tablet   metoprolol  succinate (TOPROL -XL) 50 MG 24 hr tablet   Osteoarthritis of multiple joints   Type 2 diabetes mellitus with other specified complication (HCC) - Primary   Relevant Medications   atorvastatin  (LIPITOR) 20 MG tablet   Other Visit Diagnoses       Eczema, unspecified type         Benign prostatic hyperplasia without lower urinary tract symptoms         Long term current use of oral hypoglycemic drug            Updated Health Maintenance information Reviewed recent lab results with patient Encouraged improvement to lifestyle with diet and exercise Goal of weight loss  Adult Wellness Visit Annual wellness visit conducted. Blood pressure  well-controlled. Cholesterol improved. PSA stable. Thyroid function normal. Kidney function improved. Sodium levels slightly decreased but not concerning. Blood counts normal. - Continue current medications and lifestyle modifications. - Scheduled follow-up in six months.  Type 2 diabetes mellitus with other specified complication Hyperlipidemia A1c increased from 7.5 to 8.3, indicating suboptimal glycemic control. Discussed potential for newer medications if A1c remains elevated. - Continue metformin  1000 mg twice daily. - Encouraged dietary modifications to reduce starch intake. - Monitor A1c levels and consider medication adjustment if A1c remains elevated. Discussed GLP1, DPP4, SGLT2 med options we can consider in future if not able to manage A1c < 8.0% with further lifestyle interventions  Hyperlipidemia secondary to diabetes Controlled LDL 70s on Atorvastatin  20mg  daily, refill  Essential hypertension Blood pressure well-controlled with current medication regimen. - Continue current antihypertensive medication regimen. - Refilled amlodipine  prescription.  Eczema  Chronic eczema with intermittent flares. Current treatment with 5% triamcinolone  cream effective. No signs of infection. - Continue 5% triamcinolone  cream twice daily as needed for one to two weeks. - Use moisturizers after applying medication.  Benign prostatic hyperplasia PSA stable at 2.86. - Continue monitoring PSA levels.      No orders of the defined types were placed in this encounter.   Meds ordered this encounter  Medications   atorvastatin  (LIPITOR) 20 MG tablet    Sig: Take 1 tablet (20 mg total) by mouth daily.    Dispense:  90 tablet    Refill:  1    90 day and add refills   hydrochlorothiazide  (HYDRODIURIL ) 25 MG tablet    Sig: Take 1 tablet (25 mg total) by mouth daily.    Dispense:  90 tablet    Refill:  1    Add refills   metoprolol  succinate (TOPROL -XL) 50 MG 24 hr tablet    Sig: Take 1  tablet (50 mg total) by mouth daily.    Dispense:  90 tablet    Refill:  1    Add refills     Follow up plan: Return for 6 month DM A1c, HTN.  Marsa Officer, DO [facility] 11/07/2024, 8:13 AM  "

## 2024-12-02 ENCOUNTER — Ambulatory Visit: Admitting: Family Medicine

## 2024-12-02 ENCOUNTER — Encounter: Payer: Self-pay | Admitting: Family Medicine

## 2024-12-02 VITALS — BP 146/80 | HR 62 | Ht 68.0 in | Wt 219.0 lb

## 2024-12-02 DIAGNOSIS — I10 Essential (primary) hypertension: Secondary | ICD-10-CM | POA: Diagnosis not present

## 2024-12-02 DIAGNOSIS — R519 Headache, unspecified: Secondary | ICD-10-CM | POA: Diagnosis not present

## 2024-12-02 DIAGNOSIS — G43909 Migraine, unspecified, not intractable, without status migrainosus: Secondary | ICD-10-CM

## 2024-12-02 MED ORDER — UBRELVY 100 MG PO TABS: 100.0000 mg | ORAL_TABLET | ORAL | Status: AC | PRN

## 2024-12-02 MED ORDER — VALSARTAN 160 MG PO TABS: 160.0000 mg | ORAL_TABLET | Freq: Every day | ORAL | 3 refills | Status: AC

## 2024-12-02 NOTE — Patient Instructions (Addendum)
 Thank you for coming to the office today.  Likely elevated sodium intake can raise BP and headache  Could be pressure related  Try to reduce sodium in diet to avoid future headaches.  Discontinue Lisinopril , start Valsartan  160mg  daily  Keep all of the other BP medications  Trial on a migraine free sample medication to see if this works. Ubrelvy  medication take one pill if you have a severe headache and we think it may be migraine, it should work within 30 min to 1-2 hour then that is it for 24 hours, you can repeat another dose in the future next day or later if you have another episode of headache.  ---------------------  You have been referred for a Coronary Calcium  Score Cardiac CT Scan. This is a screening test for patients aged 69-50+ with cardiovascular risk factors or who are healthy but would be interested in Cardiovascular Screening for heart disease. Even if there is a family history of heart disease, this imaging can be useful. Typically it can be done every 5+ years or at a different timeline we agree on  The scan will look at the chest and mainly focus on the heart and identify early signs of calcium  build up or blockages within the heart arteries. It is not 100% accurate for identifying blockages or heart disease, but it is useful to help us  predict who may have some early changes or be at risk in the future for a heart attack or cardiovascular problem.  The results are reviewed by a Cardiologist and they will document the results. It should become available on MyChart. Typically the results are divided into percentiles based on other patients of the same demographic and age. So it will compare your risk to others similar to you. If you have a higher score >99 or higher percentile >75%tile, it is recommended to consider Statin cholesterol therapy and or referral to Cardiologist. I will try to help explain your results and if we have questions we can contact the  Cardiologist.  You will be contacted for scheduling. Usually it is done at any imaging facility through [facility], [facility] or [facility].  The cost is $99 flat fee total and it does not go through insurance, so no authorization is required.   Please schedule a Follow-up Appointment to: Return if symptoms worsen or fail to improve.  If you have any other questions or concerns, please feel free to call the office or send a message through MyChart. You may also schedule an earlier appointment if necessary.  Additionally, you may be receiving a survey about your experience at our office within a few days to 1 week by e-mail or mail. We value your feedback.  Marsa Officer, DO [facility], [place]

## 2024-12-02 NOTE — Progress Notes (Signed)
 "  Subjective:    Patient ID: Brady Sanchez, male    DOB: Aug 01, 1946, 79 y.o.   MRN: [id number]  Brady Sanchez is a 79 y.o. male presenting on 12/02/2024 for Headache (Off and on since last Thursday)  Patient presents for a same day appointment.  HPI  Discussed the use of AI scribe software for clinical note transcription with the patient, who gave verbal consent to proceed.  History of Present Illness   Brady Sanchez is a 79 year old male who presents with headaches after eating.    Headache, Frontal bilateral No current headache today Onset last week Thursday after eating he developed sudden or acute headache that lasted most of the day Recurrent headache again after dinner He admits trigger in past with high sodium, recently he admits some [employer] fries with extra seasoning and salt Seems to recur each day with meals Tried OTC Ibuprofen 200mg  x 3 = 600mg  temporary questionable relief Coffee in AM 1-2 cups average. Hasn't changed - No associated numbness, tingling, weakness, vision changes, or dizziness - No swelling in feet or ankles - Headaches subside gradually on his own Dietary factors - Frequently consumes seasoned fries from [employer] and history of pork intake with higher sodium content - History of high sodium intake associated with previous migraines - Recent reduction in sodium intake, but headaches continue - Past migraines were severe, causing irritability and sensitivity to noise - No recent symptoms of irritability or noise sensitivity  CHRONIC HTN: Controlled previously but now has higher BP readings Not able to check at home, no cuff but has HSA can get one Current Meds - Amlodipine  10mg  daily, Lisinopril  40mg  daily, HCTZ 25mg  daily, Metoprolol  XL 50mg  daily Reports good compliance, took meds today. Tolerating well, w/o complaints. Headaches Denies CP, dyspnea, edema, dizziness / lightheadedness      11/07/2024    8:33 AM 08/08/2024    12:50 PM 10/25/2023    8:43 AM  Depression screen PHQ 2/9  Decreased Interest 0 0 0  Down, Depressed, Hopeless 0 0 0  PHQ - 2 Score 0 0 0  Altered sleeping  0   Tired, decreased energy  0   Change in appetite  0   Feeling bad or failure about yourself   0   Trouble concentrating  0   Moving slowly or fidgety/restless  0   Suicidal thoughts  0   PHQ-9 Score  0    Difficult doing work/chores  Not difficult at all      Data saved with a previous flowsheet row definition       11/07/2024    8:33 AM 10/25/2023    8:44 AM 09/13/2021    1:22 PM 12/28/2017    2:30 PM  GAD 7 : Generalized Anxiety Score  Nervous, Anxious, on Edge 0  0  0  0   Control/stop worrying 0  0  0  0   Worry too much - different things 0  0  0  0   Trouble relaxing 0  0  0  0   Restless 0  0  0  0   Easily annoyed or irritable 0  0  0  0   Afraid - awful might happen 0  0  0  0   Total GAD 7 Score 0 0 0 0  Anxiety Difficulty   Not difficult at all Not difficult at all     Data saved with a previous flowsheet row  definition    Social History[1]  Review of Systems Per HPI unless specifically indicated above     Objective:    BP (!) 146/80 (BP Location: Left Arm, Cuff Size: Normal)   Pulse 62   Ht 5' 8 (1.727 m)   Wt 219 lb (99.3 kg)   SpO2 99%   BMI 33.30 kg/m   Wt Readings from Last 3 Encounters:  12/02/24 219 lb (99.3 kg)  11/07/24 217 lb 4 oz (98.5 kg)  04/24/24 216 lb 2 oz (98 kg)    Physical Exam Vitals and nursing note reviewed.  Constitutional:      General: He is not in acute distress.    Appearance: He is well-developed. He is not diaphoretic.     Comments: Well-appearing, comfortable, cooperative  HENT:     Head: Normocephalic and atraumatic.  Eyes:     General:        Right eye: No discharge.        Left eye: No discharge.     Conjunctiva/sclera: Conjunctivae normal.  Neck:     Thyroid: No thyromegaly.  Cardiovascular:     Rate and Rhythm: Normal rate and regular  rhythm.     Pulses: Normal pulses.     Heart sounds: Murmur (2/6 systolic murmur radiate to carotids) heard.  Pulmonary:     Effort: Pulmonary effort is normal. No respiratory distress.     Breath sounds: Normal breath sounds. No wheezing or rales.  Musculoskeletal:        General: Normal range of motion.     Cervical back: Normal range of motion and neck supple.  Lymphadenopathy:     Cervical: No cervical adenopathy.  Skin:    General: Skin is warm and dry.     Findings: No erythema or rash.  Neurological:     Mental Status: He is alert and oriented to person, place, and time. Mental status is at baseline.  Psychiatric:        Behavior: Behavior normal.     Comments: Well groomed, good eye contact, normal speech and thoughts     Results for orders placed or performed in visit on 10/31/24  OPHTHALMOLOGY REPORT-SCANNED   Collection Time: 10/31/24 12:24 PM  Result Value Ref Range   HM Diabetic Eye Exam No Retinopathy No Retinopathy   A Comment        Assessment & Plan:   Problem List Items Addressed This Visit     Essential hypertension   Relevant Medications   valsartan  (DIOVAN ) 160 MG tablet   Other Relevant Orders   CT CARDIAC SCORING (SELF PAY ONLY)   Other Visit Diagnoses       Frontal headache    -  Primary   Relevant Medications   Ubrogepant  (UBRELVY ) 100 MG TABS     Episodic migraine       Relevant Medications   valsartan  (DIOVAN ) 160 MG tablet   Ubrogepant  (UBRELVY ) 100 MG TABS        Frontal headache, likely secondary to Hypertension vs Migraine Intermittent frontal headaches postprandially, possibly related to dietary sodium intake that raises BP or migraine. He has past history of migraine but none lately seems less likely but possible. No neurological symptoms.   - Advised to reduce sodium intake in diet - Adjust BP medications see below - Obtain BP cuff to monitor BP - Provided free sample of Ubrelvy  (ubrogepant ) for migraine trial. 100mg  per  dose, as needed dosing instructed - Instructed to monitor headache  response to dietary changes and migraine medication.  Essential hypertension Blood pressure slightly elevated at 144/78 mmHg. Current regimen includes metoprolol , lisinopril , hydrochlorothiazide , and amlodipine . Decision made to switch lisinopril  to valsartan  for better control. Discussed potential heart scan for arterial blockages.  - Discontinued lisinopril  40mg  - Initiated valsartan  160 mg daily. Can adjust again to 320mg  in future if indicated - Advised to monitor blood pressure. - Ordered heart scan to assess for arterial blockages / CT Calcium  Score       Orders Placed This Encounter  Procedures   CT CARDIAC SCORING (SELF PAY ONLY)    Standing Status:   Future    Expiration Date:   12/02/2025    Preferred imaging location?:   [facility]    Meds ordered this encounter  Medications   valsartan  (DIOVAN ) 160 MG tablet    Sig: Take 1 tablet (160 mg total) by mouth daily.    Dispense:  90 tablet    Refill:  3    Discontinue Lisinopril  40mg  and switch to Valsartan  160mg    Ubrogepant  (UBRELVY ) 100 MG TABS    Sig: Take 1 tablet (100 mg total) by mouth as needed (migraine headache).    Follow up plan: Return if symptoms worsen or fail to improve.   Marsa Officer, DO [facility] 12/02/2024, 2:06 PM     [1]  Social History Tobacco Use   Smoking status: Never   Smokeless tobacco: Never  Vaping Use   Vaping status: Never Used  Substance Use Topics   Alcohol use: Yes   Drug use: No   "

## 2024-12-04 ENCOUNTER — Other Ambulatory Visit: Payer: Self-pay | Admitting: Family Medicine

## 2024-12-04 DIAGNOSIS — I10 Essential (primary) hypertension: Secondary | ICD-10-CM

## 2024-12-04 NOTE — Telephone Encounter (Signed)
 Requested Prescriptions  Refused Prescriptions Disp Refills   hydrochlorothiazide  (HYDRODIURIL ) 25 MG tablet [Pharmacy Med Name: HYDROCHLOROTHIAZIDE  25 MG TAB]  0     Cardiovascular: Diuretics - Thiazide Failed - 12/04/2024  5:50 PM      Failed - Na in normal range and within 180 days    Sodium  Date Value Ref Range Status  10/28/2024 134 (L) 135 - 146 mmol/L Final  01/30/2012 141 136 - 145 mmol/L Final         Failed - Last BP in normal range    BP Readings from Last 1 Encounters:  12/02/24 (!) 146/80         Passed - Cr in normal range and within 180 days    Creat  Date Value Ref Range Status  10/28/2024 0.92 0.70 - 1.28 mg/dL Final   Creatinine, Urine  Date Value Ref Range Status  10/28/2024 32 20 - 320 mg/dL Final         Passed - K in normal range and within 180 days    Potassium  Date Value Ref Range Status  10/28/2024 4.0 3.5 - 5.3 mmol/L Final  01/30/2012 3.6 3.5 - 5.1 mmol/L Final         Passed - Valid encounter within last 6 months    Recent Outpatient Visits           2 days ago Frontal headache   [place] [facility] [place] Marsa PARAS, DO   3 weeks ago Type 2 diabetes mellitus with other specified complication, without long-term current use of insulin  [facility])   [place] [facility] [place], Marsa PARAS, DO   7 months ago Type 2 diabetes mellitus with other specified complication, without long-term current use of insulin  [facility])   [place] [facility] [place], Marsa PARAS, [place]

## 2024-12-05 ENCOUNTER — Other Ambulatory Visit: Payer: Self-pay | Admitting: Family Medicine

## 2024-12-05 DIAGNOSIS — I10 Essential (primary) hypertension: Secondary | ICD-10-CM

## 2024-12-05 MED ORDER — HYDROCHLOROTHIAZIDE 25 MG PO TABS: 25.0000 mg | ORAL_TABLET | Freq: Every day | ORAL | 1 refills | Status: AC

## 2024-12-05 NOTE — Telephone Encounter (Signed)
 Requested medications are due for refill today.  See note from pharmacy  Requested medications are on the active medications list.  yes  Last refill. 11/07/2024 #90 1 rf  Future visit scheduled.   yes  Notes to clinic.  Pharmacy comment: Alternative Requested:MAX FILLS LIMIT REACHED PER PLAN. PLEASE SEND NEW SCRIPT.     Requested Prescriptions  Pending Prescriptions Disp Refills   hydrochlorothiazide  (HYDRODIURIL ) 25 MG tablet [Pharmacy Med Name: HYDROCHLOROTHIAZIDE  25 MG TAB]  0     Cardiovascular: Diuretics - Thiazide Failed - 12/05/2024  2:15 PM      Failed - Na in normal range and within 180 days    Sodium  Date Value Ref Range Status  10/28/2024 134 (L) 135 - 146 mmol/L Final  01/30/2012 141 136 - 145 mmol/L Final         Failed - Last BP in normal range    BP Readings from Last 1 Encounters:  12/02/24 (!) 146/80         Passed - Cr in normal range and within 180 days    Creat  Date Value Ref Range Status  10/28/2024 0.92 0.70 - 1.28 mg/dL Final   Creatinine, Urine  Date Value Ref Range Status  10/28/2024 32 20 - 320 mg/dL Final         Passed - K in normal range and within 180 days    Potassium  Date Value Ref Range Status  10/28/2024 4.0 3.5 - 5.3 mmol/L Final  01/30/2012 3.6 3.5 - 5.1 mmol/L Final         Passed - Valid encounter within last 6 months    Recent Outpatient Visits           3 days ago Frontal headache   [place] [facility] [place] Marsa PARAS, DO   4 weeks ago Type 2 diabetes mellitus with other specified complication, without long-term current use of insulin  [facility])   [place] [facility] [place], Marsa PARAS, DO   7 months ago Type 2 diabetes mellitus with other specified complication, without long-term current use of insulin  [facility])   [place] [facility] [place], Marsa PARAS, [place]

## 2024-12-22 ENCOUNTER — Other Ambulatory Visit

## 2025-05-08 ENCOUNTER — Ambulatory Visit: Admitting: Family Medicine

## 2025-08-21 ENCOUNTER — Ambulatory Visit
